# Patient Record
Sex: Male | Born: 1960 | Race: Black or African American | Hispanic: No | Marital: Married | Smoking: Current every day smoker
Health system: Southern US, Community
[De-identification: ages and names within clinical notes are randomized; demographics above are authoritative.]

## PROBLEM LIST (undated history)

## (undated) DIAGNOSIS — R0789 Other chest pain: Secondary | ICD-10-CM

## (undated) DIAGNOSIS — Z95818 Presence of other cardiac implants and grafts: Secondary | ICD-10-CM

## (undated) DIAGNOSIS — I10 Essential (primary) hypertension: Secondary | ICD-10-CM

## (undated) DIAGNOSIS — Z8241 Family history of sudden cardiac death: Secondary | ICD-10-CM

## (undated) DIAGNOSIS — Z720 Tobacco use: Secondary | ICD-10-CM

## (undated) DIAGNOSIS — I428 Other cardiomyopathies: Secondary | ICD-10-CM

## (undated) DIAGNOSIS — I5022 Chronic systolic (congestive) heart failure: Secondary | ICD-10-CM

## (undated) DIAGNOSIS — I5020 Unspecified systolic (congestive) heart failure: Secondary | ICD-10-CM

## (undated) DIAGNOSIS — R9431 Abnormal electrocardiogram [ECG] [EKG]: Secondary | ICD-10-CM

## (undated) DIAGNOSIS — H409 Unspecified glaucoma: Secondary | ICD-10-CM

## (undated) DIAGNOSIS — M1990 Unspecified osteoarthritis, unspecified site: Secondary | ICD-10-CM

## (undated) DIAGNOSIS — I456 Pre-excitation syndrome: Secondary | ICD-10-CM

## (undated) HISTORY — DX: Other cardiomyopathies: I42.8

## (undated) HISTORY — DX: Abnormal electrocardiogram (ECG) (EKG): R94.31

## (undated) HISTORY — PX: EYE SURGERY: SHX253

## (undated) HISTORY — DX: Unspecified osteoarthritis, unspecified site: M19.90

## (undated) HISTORY — DX: Unspecified glaucoma: H40.9

## (undated) HISTORY — DX: Family history of sudden cardiac death: Z82.41

## (undated) HISTORY — DX: Pre-excitation syndrome: I45.6

## (undated) HISTORY — DX: Unspecified systolic (congestive) heart failure: I50.20

## (undated) HISTORY — DX: Other chest pain: R07.89

## (undated) HISTORY — DX: Essential (primary) hypertension: I10

## (undated) HISTORY — DX: Presence of other cardiac implants and grafts: Z95.818

## (undated) HISTORY — DX: Chronic systolic (congestive) heart failure: I50.22

## (undated) HISTORY — DX: Tobacco use: Z72.0

---

## 2012-12-30 DIAGNOSIS — B182 Chronic viral hepatitis C: Secondary | ICD-10-CM | POA: Insufficient documentation

## 2013-11-12 DIAGNOSIS — M542 Cervicalgia: Secondary | ICD-10-CM | POA: Insufficient documentation

## 2014-01-14 DIAGNOSIS — N401 Enlarged prostate with lower urinary tract symptoms: Secondary | ICD-10-CM | POA: Insufficient documentation

## 2014-01-14 DIAGNOSIS — R3914 Feeling of incomplete bladder emptying: Secondary | ICD-10-CM | POA: Insufficient documentation

## 2014-01-31 DIAGNOSIS — G894 Chronic pain syndrome: Secondary | ICD-10-CM | POA: Insufficient documentation

## 2014-02-25 DIAGNOSIS — M79604 Pain in right leg: Secondary | ICD-10-CM | POA: Insufficient documentation

## 2014-03-16 DIAGNOSIS — R079 Chest pain, unspecified: Secondary | ICD-10-CM | POA: Insufficient documentation

## 2014-03-16 DIAGNOSIS — R002 Palpitations: Secondary | ICD-10-CM | POA: Insufficient documentation

## 2014-10-19 DIAGNOSIS — A048 Other specified bacterial intestinal infections: Secondary | ICD-10-CM | POA: Insufficient documentation

## 2016-01-15 DIAGNOSIS — H2513 Age-related nuclear cataract, bilateral: Secondary | ICD-10-CM | POA: Insufficient documentation

## 2016-06-07 DIAGNOSIS — F4321 Adjustment disorder with depressed mood: Secondary | ICD-10-CM

## 2016-06-07 HISTORY — DX: Adjustment disorder with depressed mood: F43.21

## 2016-08-12 DIAGNOSIS — R42 Dizziness and giddiness: Secondary | ICD-10-CM | POA: Insufficient documentation

## 2017-11-20 DIAGNOSIS — R0600 Dyspnea, unspecified: Secondary | ICD-10-CM | POA: Insufficient documentation

## 2017-11-20 DIAGNOSIS — R0609 Other forms of dyspnea: Secondary | ICD-10-CM | POA: Insufficient documentation

## 2018-01-06 DIAGNOSIS — F331 Major depressive disorder, recurrent, moderate: Secondary | ICD-10-CM | POA: Insufficient documentation

## 2019-05-04 DIAGNOSIS — G459 Transient cerebral ischemic attack, unspecified: Secondary | ICD-10-CM | POA: Insufficient documentation

## 2019-05-04 DIAGNOSIS — I10 Essential (primary) hypertension: Secondary | ICD-10-CM | POA: Insufficient documentation

## 2019-05-04 DIAGNOSIS — Q2112 Patent foramen ovale: Secondary | ICD-10-CM | POA: Insufficient documentation

## 2019-05-04 DIAGNOSIS — I456 Pre-excitation syndrome: Secondary | ICD-10-CM | POA: Insufficient documentation

## 2019-05-04 DIAGNOSIS — Q211 Atrial septal defect: Secondary | ICD-10-CM | POA: Insufficient documentation

## 2019-10-11 DIAGNOSIS — I420 Dilated cardiomyopathy: Secondary | ICD-10-CM | POA: Diagnosis not present

## 2019-10-11 DIAGNOSIS — F329 Major depressive disorder, single episode, unspecified: Secondary | ICD-10-CM | POA: Diagnosis not present

## 2019-10-11 DIAGNOSIS — I10 Essential (primary) hypertension: Secondary | ICD-10-CM | POA: Diagnosis not present

## 2019-10-11 DIAGNOSIS — L609 Nail disorder, unspecified: Secondary | ICD-10-CM | POA: Diagnosis not present

## 2019-10-11 DIAGNOSIS — Z681 Body mass index (BMI) 19 or less, adult: Secondary | ICD-10-CM | POA: Diagnosis not present

## 2019-10-28 DIAGNOSIS — Z4509 Encounter for adjustment and management of other cardiac device: Secondary | ICD-10-CM | POA: Diagnosis not present

## 2019-10-28 DIAGNOSIS — R002 Palpitations: Secondary | ICD-10-CM | POA: Diagnosis not present

## 2019-11-30 DIAGNOSIS — Z4509 Encounter for adjustment and management of other cardiac device: Secondary | ICD-10-CM | POA: Diagnosis not present

## 2019-12-31 DIAGNOSIS — Z4509 Encounter for adjustment and management of other cardiac device: Secondary | ICD-10-CM | POA: Diagnosis not present

## 2020-01-31 DIAGNOSIS — Z4509 Encounter for adjustment and management of other cardiac device: Secondary | ICD-10-CM | POA: Diagnosis not present

## 2020-01-31 DIAGNOSIS — R002 Palpitations: Secondary | ICD-10-CM | POA: Diagnosis not present

## 2020-02-21 DIAGNOSIS — H401123 Primary open-angle glaucoma, left eye, severe stage: Secondary | ICD-10-CM | POA: Diagnosis not present

## 2020-02-21 DIAGNOSIS — H26492 Other secondary cataract, left eye: Secondary | ICD-10-CM | POA: Diagnosis not present

## 2020-02-21 DIAGNOSIS — H401133 Primary open-angle glaucoma, bilateral, severe stage: Secondary | ICD-10-CM | POA: Insufficient documentation

## 2020-02-21 DIAGNOSIS — Z961 Presence of intraocular lens: Secondary | ICD-10-CM | POA: Diagnosis not present

## 2020-02-21 DIAGNOSIS — I10 Essential (primary) hypertension: Secondary | ICD-10-CM | POA: Diagnosis not present

## 2020-02-21 DIAGNOSIS — H2511 Age-related nuclear cataract, right eye: Secondary | ICD-10-CM | POA: Diagnosis not present

## 2020-02-21 DIAGNOSIS — H40001 Preglaucoma, unspecified, right eye: Secondary | ICD-10-CM | POA: Diagnosis not present

## 2020-02-21 DIAGNOSIS — H04123 Dry eye syndrome of bilateral lacrimal glands: Secondary | ICD-10-CM | POA: Diagnosis not present

## 2020-02-21 DIAGNOSIS — Z9889 Other specified postprocedural states: Secondary | ICD-10-CM | POA: Insufficient documentation

## 2020-03-02 DIAGNOSIS — Z4509 Encounter for adjustment and management of other cardiac device: Secondary | ICD-10-CM | POA: Diagnosis not present

## 2020-04-03 DIAGNOSIS — R002 Palpitations: Secondary | ICD-10-CM | POA: Diagnosis not present

## 2020-04-03 DIAGNOSIS — Z4509 Encounter for adjustment and management of other cardiac device: Secondary | ICD-10-CM | POA: Diagnosis not present

## 2020-05-22 DIAGNOSIS — Z122 Encounter for screening for malignant neoplasm of respiratory organs: Secondary | ICD-10-CM | POA: Diagnosis not present

## 2020-05-22 DIAGNOSIS — Z87891 Personal history of nicotine dependence: Secondary | ICD-10-CM | POA: Diagnosis not present

## 2020-06-05 DIAGNOSIS — Z4509 Encounter for adjustment and management of other cardiac device: Secondary | ICD-10-CM | POA: Diagnosis not present

## 2020-06-14 DIAGNOSIS — Z8673 Personal history of transient ischemic attack (TIA), and cerebral infarction without residual deficits: Secondary | ICD-10-CM | POA: Diagnosis not present

## 2020-06-14 DIAGNOSIS — Z681 Body mass index (BMI) 19 or less, adult: Secondary | ICD-10-CM | POA: Diagnosis not present

## 2020-06-14 DIAGNOSIS — B182 Chronic viral hepatitis C: Secondary | ICD-10-CM | POA: Diagnosis not present

## 2020-06-14 DIAGNOSIS — I420 Dilated cardiomyopathy: Secondary | ICD-10-CM | POA: Diagnosis not present

## 2020-06-14 DIAGNOSIS — I493 Ventricular premature depolarization: Secondary | ICD-10-CM | POA: Diagnosis not present

## 2020-06-14 DIAGNOSIS — R002 Palpitations: Secondary | ICD-10-CM | POA: Diagnosis not present

## 2020-06-14 DIAGNOSIS — I10 Essential (primary) hypertension: Secondary | ICD-10-CM | POA: Diagnosis not present

## 2020-06-14 DIAGNOSIS — F17210 Nicotine dependence, cigarettes, uncomplicated: Secondary | ICD-10-CM | POA: Diagnosis not present

## 2020-07-06 DIAGNOSIS — Z4509 Encounter for adjustment and management of other cardiac device: Secondary | ICD-10-CM | POA: Diagnosis not present

## 2020-07-06 DIAGNOSIS — R002 Palpitations: Secondary | ICD-10-CM | POA: Diagnosis not present

## 2020-11-29 ENCOUNTER — Encounter: Payer: Self-pay | Admitting: Cardiology

## 2020-11-29 ENCOUNTER — Other Ambulatory Visit: Payer: Self-pay

## 2020-11-29 ENCOUNTER — Ambulatory Visit (INDEPENDENT_AMBULATORY_CARE_PROVIDER_SITE_OTHER): Payer: Medicare HMO | Admitting: Cardiology

## 2020-11-29 VITALS — BP 114/70 | HR 60 | Ht 67.0 in | Wt 124.0 lb

## 2020-11-29 DIAGNOSIS — I428 Other cardiomyopathies: Secondary | ICD-10-CM | POA: Diagnosis not present

## 2020-11-29 DIAGNOSIS — R002 Palpitations: Secondary | ICD-10-CM

## 2020-11-29 NOTE — Progress Notes (Signed)
Electrophysiology Office Note:    Date:  11/29/2020   ID:  Jesus Price, DOB 1961-01-28, MRN [id number]  PCP:  Abram Sander, MD  [facility] Cardiologist:  None  [facility] Electrophysiologist:  None   Referring MD: Abram Sander, MD   Chief Complaint: Establish care  History of Present Illness:    Jesus Price is a 60 y.o. male who presents for an evaluation of nonischemic cardiomyopathy at the request of Dr. Richarda Blade. Their medical history includes nonischemic cardiomyopathy, hypertension, palpitations and a family history of sudden cardiac death.  In the past he was noted to have a short PR interval and underwent an EP study in 2019 which showed no evidence of accessory pathways or inducible SVT.  A loop recorder was implanted at that time.  His EF in 2012 was 40% but improved to 55% in 2017.  It looks like he has been followed by [facility] and [facility].  Today he tells me he is doing well.  He continues to feel intermittent palpitations.  He actually had some palpitations while we were interrogating his loop recorder.  At that time we noted PACs.  He told me that he has an extensive family history including 2 siblings who died early from heart disease.  A brother had open heart surgery in his 30s and a sister had bypass surgery in her 30s.  There is another younger sister who died early from complications from lupus.   Current Medications: Current Meds  Medication Sig  . CARDIZEM CD 180 MG 24 hr capsule Take 1 capsule by mouth daily.  . carvedilol (COREG) 6.25 MG tablet Take 6.25 mg by mouth 2 (two) times daily.  . hydrochlorothiazide (HYDRODIURIL) 12.5 MG tablet Take 1 tablet by mouth daily.  Marland Kitchen losartan (COZAAR) 100 MG tablet Take 1 tablet by mouth daily.  . mirtazapine (REMERON) 7.5 MG tablet Take 7.5 mg by mouth at bedtime.  . tamsulosin (FLOMAX) 0.4 MG CAPS capsule Take 1 capsule by mouth daily.     Allergies:   Patient has no known allergies.   Social History    Socioeconomic History  . Marital status: Married    Spouse name: Not on file  . Number of children: Not on file  . Years of education: Not on file  . Highest education level: Not on file  Occupational History  . Not on file  Tobacco Use  . Smoking status: Current Every Day Smoker    Types: Cigarettes  . Smokeless tobacco: Never Used  . Tobacco comment: 5 cigarettes per day   Substance and Sexual Activity  . Alcohol use: Not on file  . Drug use: Not on file  . Sexual activity: Not on file  Other Topics Concern  . Not on file  Social History Narrative  . Not on file   Social Determinants of Health   Financial Resource Strain: Not on file  Food Insecurity: Not on file  Transportation Needs: Not on file  Physical Activity: Not on file  Stress: Not on file  Social Connections: Not on file     Family History: The patient's family history is not on file.  ROS:   Please see the history of present illness.    All other systems reviewed and are negative.  EKGs/Labs/Other Studies Reviewed:    The following studies were reviewed today: Outside records  12/15/2017 Echo EF >55%  November 29, 2020 in clinic Loop interrogation personally reviewed No atrial fibrillation 7 symptom triggers for what  appears to be sinus rhythm with bigeminal PACs.  There is also some symptom triggered episodes for PVCs.  EKG:  The ekg ordered today demonstrates sinus rhythm.  LVH.  Recent Labs: No results found for requested labs within last 8760 hours.  Recent Lipid Panel No results found for: CHOL, TRIG, HDL, CHOLHDL, VLDL, LDLCALC, LDLDIRECT  Physical Exam:    VS:  Ht 5\' 7"  (1.702 m)   Wt 124 lb (56.2 kg)   BMI 19.42 kg/m     Wt Readings from Last 3 Encounters:  11/29/20 124 lb (56.2 kg)     GEN:  Well nourished, well developed in no acute distress.  Thin HEENT: Normal NECK: No JVD; No carotid bruits LYMPHATICS: No lymphadenopathy CARDIAC: RRR, no murmurs, rubs,  gallops RESPIRATORY:  Clear to auscultation without rales, wheezing or rhonchi  ABDOMEN: Soft, non-tender, non-distended MUSCULOSKELETAL:  No edema; No deformity  SKIN: Warm and dry NEUROLOGIC:  Alert and oriented x 3 PSYCHIATRIC:  Normal affect   ASSESSMENT:    1. NICM (nonischemic cardiomyopathy) (HCC)   2. Palpitations    PLAN:    In order of problems listed above:  1. Nonischemic cardiomyopathy NYHA class II.  Warm and dry.  Last EF in 2019 greater than 55%. Extensive family history of premature cardiovascular disease and death. I would like to update his echocardiogram given it was last done in 2019 to confirm no signs of significant structural heart disease or change in ejection fraction. We will also update his lab work including a lipid panel.  2.  Palpitations Loop monitor in place.  Controlled on carvedilol and Cardizem.  We will establish his loop recorder for monitoring with our clinic.  Medication Adjustments/Labs and Tests Ordered: Current medicines are reviewed at length with the patient today.  Concerns regarding medicines are outlined above.  No orders of the defined types were placed in this encounter.  No orders of the defined types were placed in this encounter.    Signed, 2020. Rossie Muskrat, MD, [facility], [facility] 11/29/2020 9:23 AM    Electrophysiology [facility]

## 2020-11-29 NOTE — Patient Instructions (Signed)
Medication Instructions:  Your physician recommends that you continue on your current medications as directed. Please refer to the Current Medication list given to you today. *If you need a refill on your cardiac medications before your next appointment, please call your pharmacy*  Lab Work: You will get lab work today:  CMP, CBC and lipid profile  If you have labs (blood work) drawn today and your tests are completely normal, you will receive your results only by: Marland Kitchen MyChart Message (if you have MyChart) OR . A paper copy in the mail If you have any lab test that is abnormal or we need to change your treatment, we will call you to review the results.  Testing/Procedures: Your physician has requested that you have an echocardiogram. Echocardiography is a painless test that uses sound waves to create images of your heart. It provides your doctor with information about the size and shape of your heart and how well your heart's chambers and valves are working. This procedure takes approximately one hour. There are no restrictions for this procedure.  Please schedule for ECHO  Follow-Up: At [facility], you and your health needs are our priority.  As part of our continuing mission to provide you with exceptional heart care, we have created designated Provider Care Teams.  These Care Teams include your primary Cardiologist (physician) and Advanced Practice Providers (APPs -  Physician Assistants and Nurse Practitioners) who all work together to provide you with the care you need, when you need it.  Your next appointment:   Your physician wants you to follow-up in: 6 months with Dr. Lalla Brothers.   You will receive a reminder letter in the mail two months in advance. If you don't receive a letter, please call our office to schedule the follow-up appointment.  Remote monitoring is used to monitor your loop recorder from home.   We will get your remote monitoring transferred to our office.  Device  clinic [phone]

## 2020-11-30 LAB — COMPREHENSIVE METABOLIC PANEL
ALT: 7 IU/L (ref 0–44)
AST: 12 IU/L (ref 0–40)
Albumin/Globulin Ratio: 1.8 (ref 1.2–2.2)
Albumin: 4.6 g/dL (ref 3.8–4.8)
Alkaline Phosphatase: 76 IU/L (ref 44–121)
BUN/Creatinine Ratio: 17 (ref 10–24)
BUN: 18 mg/dL (ref 8–27)
Bilirubin Total: 0.3 mg/dL (ref 0.0–1.2)
CO2: 23 mmol/L (ref 20–29)
Calcium: 9.1 mg/dL (ref 8.6–10.2)
Chloride: 103 mmol/L (ref 96–106)
Creatinine, Ser: 1.05 mg/dL (ref 0.76–1.27)
Globulin, Total: 2.6 g/dL (ref 1.5–4.5)
Glucose: 55 mg/dL — ABNORMAL LOW (ref 65–99)
Potassium: 4.6 mmol/L (ref 3.5–5.2)
Sodium: 140 mmol/L (ref 134–144)
Total Protein: 7.2 g/dL (ref 6.0–8.5)
eGFR: 80 mL/min/{1.73_m2} (ref >59)

## 2020-11-30 LAB — LIPID PANEL
Chol/HDL Ratio: 2.9 ratio (ref 0.0–5.0)
Cholesterol, Total: 150 mg/dL (ref 100–199)
HDL: 52 mg/dL (ref >39)
LDL Chol Calc (NIH): 82 mg/dL (ref 0–99)
Triglycerides: 83 mg/dL (ref 0–149)
VLDL Cholesterol Cal: 16 mg/dL (ref 5–40)

## 2020-11-30 LAB — CBC WITH DIFFERENTIAL/PLATELET
Basophils Absolute: 0 10*3/uL (ref 0.0–0.2)
Basos: 1 %
EOS (ABSOLUTE): 0.1 10*3/uL (ref 0.0–0.4)
Eos: 2 %
Hematocrit: 40.2 % (ref 37.5–51.0)
Hemoglobin: 13.9 g/dL (ref 13.0–17.7)
Immature Grans (Abs): 0 10*3/uL (ref 0.0–0.1)
Immature Granulocytes: 0 %
Lymphocytes Absolute: 2.1 10*3/uL (ref 0.7–3.1)
Lymphs: 38 %
MCH: 32.5 pg (ref 26.6–33.0)
MCHC: 34.6 g/dL (ref 31.5–35.7)
MCV: 94 fL (ref 79–97)
Monocytes Absolute: 0.3 10*3/uL (ref 0.1–0.9)
Monocytes: 6 %
Neutrophils Absolute: 2.9 10*3/uL (ref 1.4–7.0)
Neutrophils: 53 %
Platelets: 202 10*3/uL (ref 150–450)
RBC: 4.28 x10E6/uL (ref 4.14–5.80)
RDW: 12.8 % (ref 11.6–15.4)
WBC: 5.5 10*3/uL (ref 3.4–10.8)

## 2020-12-04 ENCOUNTER — Encounter: Payer: Self-pay | Admitting: Podiatry

## 2020-12-04 ENCOUNTER — Ambulatory Visit: Payer: Medicare HMO | Admitting: Podiatry

## 2020-12-04 ENCOUNTER — Other Ambulatory Visit: Payer: Self-pay

## 2020-12-04 DIAGNOSIS — L603 Nail dystrophy: Secondary | ICD-10-CM

## 2020-12-04 DIAGNOSIS — B351 Tinea unguium: Secondary | ICD-10-CM | POA: Diagnosis not present

## 2020-12-04 DIAGNOSIS — M79676 Pain in unspecified toe(s): Secondary | ICD-10-CM

## 2020-12-04 DIAGNOSIS — I429 Cardiomyopathy, unspecified: Secondary | ICD-10-CM | POA: Insufficient documentation

## 2020-12-04 DIAGNOSIS — I428 Other cardiomyopathies: Secondary | ICD-10-CM | POA: Insufficient documentation

## 2020-12-04 NOTE — Progress Notes (Signed)
  Subjective:  Patient ID: Jesus Price, male    DOB: Mar 04, 1961,  MRN: [id number] HPI Chief Complaint  Patient presents with  . Nail Problem    Hallux nail right - thick, dark x years, unable to trim himself  . New Patient (Initial Visit)    60 y.o. male presents with the above complaint.   ROS: Denies fever chills nausea vomit muscle aches pains calf pain back pain chest pain shortness of breath.  No past medical history on file.   Current Outpatient Medications:  .  CARDIZEM CD 180 MG 24 hr capsule, Take 1 capsule by mouth daily., Disp: , Rfl:  .  carvedilol (COREG) 6.25 MG tablet, Take 6.25 mg by mouth 2 (two) times daily., Disp: , Rfl:  .  hydrochlorothiazide (HYDRODIURIL) 12.5 MG tablet, Take 1 tablet by mouth daily., Disp: , Rfl:  .  losartan (COZAAR) 100 MG tablet, Take 1 tablet by mouth daily., Disp: , Rfl:  .  mirtazapine (REMERON) 7.5 MG tablet, Take 7.5 mg by mouth at bedtime., Disp: , Rfl:  .  tamsulosin (FLOMAX) 0.4 MG CAPS capsule, Take 1 capsule by mouth daily., Disp: , Rfl:   No Known Allergies Review of Systems Objective:  There were no vitals filed for this visit.  General: Well developed, nourished, in no acute distress, alert and oriented x3   Dermatological: Skin is warm, dry and supple bilateral. Nails x 10 are well maintained; remaining integument appears unremarkable at this time. There are no open sores, no preulcerative lesions, no rash or signs of infection present.  Hallux nail right is thick and dystrophic very well could be mycotic though the surrounding tissue does not demonstrate mycosis.  Vascular: Dorsalis Pedis artery and Posterior Tibial artery pedal pulses are 2/4 bilateral with immedate capillary fill time. Pedal hair growth present. No varicosities and no lower extremity edema present bilateral.   Neruologic: Grossly intact via light touch bilateral. Vibratory intact via tuning fork bilateral. Protective threshold with Semmes Wienstein  monofilament intact to all pedal sites bilateral. Patellar and Achilles deep tendon reflexes 2+ bilateral. No Babinski or clonus noted bilateral.   Musculoskeletal: No gross boney pedal deformities bilateral. No pain, crepitus, or limitation noted with foot and ankle range of motion bilateral. Muscular strength 5/5 in all groups tested bilateral.  Gait: Unassisted, Nonantalgic.    Radiographs:  None taken  Assessment & Plan:   Assessment: Pain limb secondary to nail dystrophy and onychomycosis.  Plan: Debridement of toenails 1 through 5 bilateral     Erilyn Pearman T. [place], [place]

## 2020-12-05 ENCOUNTER — Ambulatory Visit (INDEPENDENT_AMBULATORY_CARE_PROVIDER_SITE_OTHER): Payer: Medicare HMO

## 2020-12-05 DIAGNOSIS — I429 Cardiomyopathy, unspecified: Secondary | ICD-10-CM

## 2020-12-05 LAB — CUP PACEART REMOTE DEVICE CHECK
Date Time Interrogation Session: 20220607034950
Implantable Pulse Generator Implant Date: 20190825

## 2020-12-15 DIAGNOSIS — L980 Pyogenic granuloma: Secondary | ICD-10-CM | POA: Insufficient documentation

## 2020-12-27 NOTE — Progress Notes (Signed)
Carelink Summary Report / Loop Recorder 

## 2021-01-05 ENCOUNTER — Ambulatory Visit (INDEPENDENT_AMBULATORY_CARE_PROVIDER_SITE_OTHER): Payer: Medicare HMO

## 2021-01-05 DIAGNOSIS — R002 Palpitations: Secondary | ICD-10-CM

## 2021-01-05 LAB — CUP PACEART REMOTE DEVICE CHECK
Date Time Interrogation Session: 20220708035320
Implantable Pulse Generator Implant Date: 20190825

## 2021-01-11 ENCOUNTER — Other Ambulatory Visit: Payer: Self-pay

## 2021-01-11 ENCOUNTER — Ambulatory Visit (INDEPENDENT_AMBULATORY_CARE_PROVIDER_SITE_OTHER): Payer: Medicare HMO

## 2021-01-11 DIAGNOSIS — I428 Other cardiomyopathies: Secondary | ICD-10-CM | POA: Diagnosis not present

## 2021-01-11 LAB — ECHOCARDIOGRAM COMPLETE
AR max vel: 3.03 cm2
AV Area VTI: 3.11 cm2
AV Area mean vel: 2.94 cm2
AV Mean grad: 3 mmHg
AV Peak grad: 4.8 mmHg
Ao pk vel: 1.09 m/s
Area-P 1/2: 3.72 cm2
Calc EF: 40.7 %
S' Lateral: 3.5 cm
Single Plane A2C EF: 37.4 %
Single Plane A4C EF: 48.1 %

## 2021-01-18 ENCOUNTER — Ambulatory Visit: Payer: Medicare HMO | Admitting: Adult Health

## 2021-01-26 NOTE — Progress Notes (Signed)
Carelink Summary Report / Loop Recorder 

## 2021-02-07 ENCOUNTER — Ambulatory Visit (INDEPENDENT_AMBULATORY_CARE_PROVIDER_SITE_OTHER): Payer: Medicare HMO

## 2021-02-07 DIAGNOSIS — I428 Other cardiomyopathies: Secondary | ICD-10-CM

## 2021-02-08 LAB — CUP PACEART REMOTE DEVICE CHECK
Date Time Interrogation Session: 20220808035613
Implantable Pulse Generator Implant Date: 20190825

## 2021-02-15 ENCOUNTER — Encounter: Payer: Self-pay | Admitting: Adult Health

## 2021-03-01 NOTE — Progress Notes (Signed)
Carelink Summary Report / Loop Recorder 

## 2021-03-08 ENCOUNTER — Encounter: Payer: Self-pay | Admitting: Podiatry

## 2021-03-08 ENCOUNTER — Other Ambulatory Visit: Payer: Self-pay

## 2021-03-08 ENCOUNTER — Ambulatory Visit: Payer: Medicare HMO | Admitting: Podiatry

## 2021-03-08 DIAGNOSIS — M79676 Pain in unspecified toe(s): Secondary | ICD-10-CM

## 2021-03-08 DIAGNOSIS — B351 Tinea unguium: Secondary | ICD-10-CM

## 2021-03-08 DIAGNOSIS — L603 Nail dystrophy: Secondary | ICD-10-CM

## 2021-03-08 LAB — CUP PACEART REMOTE DEVICE CHECK
Date Time Interrogation Session: 20220908040110
Implantable Pulse Generator Implant Date: 20190825

## 2021-03-08 NOTE — Progress Notes (Signed)
This patient returns to the office for evaluation and treatment of long thick painful nails .  This patient is unable to trim his own nails since the patient cannot reach his feet.  Patient says the nails are painful walking and wearing his shoes.  He returns for preventive foot care services.  General Appearance  Alert, conversant and in no acute stress.  Vascular  Dorsalis pedis and posterior tibial  pulses are palpable  bilaterally.  Capillary return is within normal limits  bilaterally. Temperature is within normal limits  bilaterally.  Neurologic  Senn-Weinstein monofilament wire test within normal limits  bilaterally. Muscle power within normal limits bilaterally.  Nails Thick disfigured discolored nails with subungual debris  from hallux to fifth toes bilaterally. No evidence of bacterial infection or drainage bilaterally.  Orthopedic  No limitations of motion  feet .  No crepitus or effusions noted.  No bony pathology or digital deformities noted.  Skin  normotropic skin with no porokeratosis noted bilaterally.  No signs of infections or ulcers noted.     Onychomycosis  Pain in toes right foot  Pain in toes left foot  Debridement  of nails  1-5  B/L with a nail nipper.  Nails were then filed using a dremel tool with no incidents.    RTC  3 months    Tedra Coppernoll DPM  

## 2021-03-12 ENCOUNTER — Ambulatory Visit (INDEPENDENT_AMBULATORY_CARE_PROVIDER_SITE_OTHER): Payer: Medicare HMO

## 2021-03-12 ENCOUNTER — Ambulatory Visit: Payer: Medicare HMO | Admitting: Adult Health

## 2021-03-12 DIAGNOSIS — I428 Other cardiomyopathies: Secondary | ICD-10-CM | POA: Diagnosis not present

## 2021-03-20 NOTE — Progress Notes (Signed)
Carelink Summary Report / Loop Recorder 

## 2021-04-16 ENCOUNTER — Ambulatory Visit (INDEPENDENT_AMBULATORY_CARE_PROVIDER_SITE_OTHER): Payer: Medicare HMO

## 2021-04-16 DIAGNOSIS — I428 Other cardiomyopathies: Secondary | ICD-10-CM

## 2021-04-18 LAB — CUP PACEART REMOTE DEVICE CHECK
Date Time Interrogation Session: 20221009040311
Implantable Pulse Generator Implant Date: 20190825

## 2021-04-25 NOTE — Progress Notes (Signed)
Carelink Summary Report / Loop Recorder 

## 2021-04-30 ENCOUNTER — Ambulatory Visit: Payer: Medicare HMO | Admitting: Adult Health

## 2021-05-15 ENCOUNTER — Other Ambulatory Visit: Payer: Self-pay

## 2021-05-15 ENCOUNTER — Encounter: Payer: Self-pay | Admitting: Internal Medicine

## 2021-05-15 ENCOUNTER — Ambulatory Visit (INDEPENDENT_AMBULATORY_CARE_PROVIDER_SITE_OTHER): Payer: Medicare HMO | Admitting: Internal Medicine

## 2021-05-15 VITALS — BP 126/80 | HR 65 | Temp 98.6°F | Ht 67.05 in | Wt 124.6 lb

## 2021-05-15 DIAGNOSIS — Z23 Encounter for immunization: Secondary | ICD-10-CM | POA: Diagnosis not present

## 2021-05-15 DIAGNOSIS — N529 Male erectile dysfunction, unspecified: Secondary | ICD-10-CM | POA: Diagnosis not present

## 2021-05-15 DIAGNOSIS — I456 Pre-excitation syndrome: Secondary | ICD-10-CM

## 2021-05-15 DIAGNOSIS — M79672 Pain in left foot: Secondary | ICD-10-CM | POA: Diagnosis not present

## 2021-05-15 NOTE — Progress Notes (Signed)
BP 126/80   Pulse 65   Temp 98.6 F (37 C) (Oral)   Ht 5' 7.05" (1.703 m)   Wt 124 lb 9.6 oz (56.5 kg)   SpO2 100%   BMI 19.49 kg/m    Subjective:    Patient ID: Jesus Price, male    DOB: 1961/04/26, 60 y.o.   MRN: [id number]  Chief Complaint  Patient presents with   New Patient (Initial Visit)    Patient states he has had groin pain for the past 3 to 4 years, and that he feels burning in both his feet for the past year.     HPI: Jesus Price is a 60 y.o. male  nonischemic cardiomyopathy and palpitations - was diagnosed wolf parkinson white WPW syndrome x 60 yrs old   has a Loop monitor in place x 3 yrs, sees dr. Lalla Brothers @ [facility], pt is on  carvedilol and Cardizem. Has a ho  HtN , GLAUCOMA s/p shunts seeing ophthalmology and cataracts,   Has some depression sec to ED.   Has had some falls recently and has   Hypertension This is a chronic problem. Pertinent negatives include no anxiety, blurred vision, chest pain, headaches, malaise/fatigue, neck pain, orthopnea, palpitations, peripheral edema, PND, shortness of breath or sweats.  Erectile Dysfunction This is a chronic problem. The current episode started more than 1 year ago. Pertinent negatives include no chills.  Dizziness This is a recurrent (fell x 2 weeks ago , has had about 4 times per his verbal record.) problem. The problem occurs intermittently. Pertinent negatives include no abdominal pain, anorexia, arthralgias, change in bowel habit, chest pain, chills, congestion, coughing, diaphoresis, fatigue, fever, headaches, joint swelling, myalgias, nausea, neck pain, numbness, rash, sore throat, swollen glands, urinary symptoms, vertigo, vomiting or weakness.   Chief Complaint  Patient presents with   New Patient (Initial Visit)    Patient states he has had groin pain for the past 3 to 4 years, and that he feels burning in both his feet for the past year.     Relevant past medical, surgical,  family and social history reviewed and updated as indicated. Interim medical history since our last visit reviewed. Allergies and medications reviewed and updated.  Review of Systems  Constitutional:  Negative for chills, diaphoresis, fatigue, fever and malaise/fatigue.  HENT:  Negative for congestion and sore throat.   Eyes:  Negative for blurred vision.  Respiratory:  Negative for cough and shortness of breath.   Cardiovascular:  Negative for chest pain, palpitations, orthopnea and PND.  Gastrointestinal:  Negative for abdominal pain, anorexia, change in bowel habit, nausea and vomiting.  Musculoskeletal:  Negative for arthralgias, joint swelling, myalgias and neck pain.  Skin:  Negative for rash.  Neurological:  Positive for dizziness. Negative for vertigo, weakness, numbness and headaches.   Per HPI unless specifically indicated above     Objective:    BP 126/80   Pulse 65   Temp 98.6 F (37 C) (Oral)   Ht 5' 7.05" (1.703 m)   Wt 124 lb 9.6 oz (56.5 kg)   SpO2 100%   BMI 19.49 kg/m   Wt Readings from Last 3 Encounters:  05/15/21 124 lb 9.6 oz (56.5 kg)  11/29/20 124 lb (56.2 kg)    Physical Exam Vitals and nursing note reviewed.  Constitutional:      General: He is not in acute distress.    Appearance: Normal appearance. He is not ill-appearing or diaphoretic.  HENT:  Head: Normocephalic and atraumatic.     Right Ear: There is no impacted cerumen.     Nose: No congestion.  Eyes:     Conjunctiva/sclera: Conjunctivae normal.     Pupils: Pupils are equal, round, and reactive to light.  Cardiovascular:     Rate and Rhythm: Normal rate and regular rhythm.     Heart sounds: No murmur heard.   No friction rub. No gallop.  Pulmonary:     Effort: No respiratory distress.     Breath sounds: No stridor. No wheezing or rhonchi.  Chest:     Chest wall: No tenderness.  Abdominal:     General: Abdomen is flat. Bowel sounds are normal. There is no distension.      Palpations: Abdomen is soft. There is no mass.     Tenderness: There is no abdominal tenderness. There is no guarding.  Musculoskeletal:     Cervical back: Normal range of motion and neck supple. No rigidity or tenderness.     Left lower leg: No edema.  Skin:    General: Skin is warm and dry.  Neurological:     Mental Status: He is alert.     Cranial Nerves: No cranial nerve deficit.     Sensory: No sensory deficit.     Motor: No weakness.     Coordination: Coordination normal.     Gait: Gait normal.     Deep Tendon Reflexes: Reflexes normal.  Psychiatric:        Mood and Affect: Mood normal.        Thought Content: Thought content normal.    Results for orders placed or performed in visit on 04/16/21  CUP PACEART REMOTE DEVICE CHECK  Result Value Ref Range   Date Time Interrogation Session [id number]    Pulse Generator Manufacturer MERM    Pulse Gen Model QMG50 Reveal LINQ    Pulse Gen Serial Number [id number]    Clinic Name [facility]    Implantable Pulse Generator Type ICM/ILR    Implantable Pulse Generator Implant Date [id number]    Eval Rhythm SR at 65 bpm         Current Outpatient Medications:    CARDIZEM CD 180 MG 24 hr capsule, Take 1 capsule by mouth daily., Disp: , Rfl:    carvedilol (COREG) 6.25 MG tablet, Take 6.25 mg by mouth 2 (two) times daily., Disp: , Rfl:    diltiazem (TIAZAC) 180 MG 24 hr capsule, diltiazem CD 180 mg capsule,extended release 24 hr, Disp: , Rfl:    losartan (COZAAR) 100 MG tablet, Take 1 tablet by mouth daily., Disp: , Rfl:    mirtazapine (REMERON) 7.5 MG tablet, Take 7.5 mg by mouth at bedtime., Disp: , Rfl:    tamsulosin (FLOMAX) 0.4 MG CAPS capsule, Take 1 capsule by mouth daily., Disp: , Rfl:     Assessment & Plan:  Is/recurrent falls history of WPW syndrome   will need to follow-up with cardiology asap Pulse rate is 65 today unsure if this is too low for him not sure what his baseline pulse rate is. Will need an echo as  per cardiology notes has had this at [facility] last ejection fraction per chart review 55% in 2017 and no new echo since then. Patient had an EP study in 2019 which showed no evidence of accessory pathway or inducible SVTs patient. Patient has nonischemic cardiomyopathy history of palpitations as well follow-up and management per cardiology.  Hypertension: Chronic stable well-controlled at this time  patient is on losartan, Coreg, diltiazem for such Continue current meds.  Medication compliance emphasised. pt advised to keep Bp logs. Pt verbalised understanding of the same. Pt to have a low salt diet . Exercise to reach a goal of at least 150 mins a week.  lifestyle modifications explained and pt understands importance of the above. ,  Foot pain with nodule on the plantar surface of the sole of the left foot will need to follow-up with podiatry for such.  Patient would like a new referral to see a new podiatrist as he did discuss this with his last podiatrist who did not do anything about this per his verbal record. Will refer.  Erectile dysfunction would like a referral to see urology.  He did see somebody in the past who was at and would like a new urologist Problem List Items Addressed This Visit   None Visit Diagnoses     Need for influenza vaccination    -  Primary   Relevant Orders   Flu Vaccine QUAD 30mo+IM (Fluarix, Fluzone & Alfiuria Quad PF) (Completed)        Orders Placed This Encounter  Procedures   Flu Vaccine QUAD 12mo+IM (Fluarix, Fluzone & Alfiuria Quad PF)     No orders of the defined types were placed in this encounter.    Follow up plan: No follow-ups on file.

## 2021-05-21 ENCOUNTER — Ambulatory Visit (INDEPENDENT_AMBULATORY_CARE_PROVIDER_SITE_OTHER): Payer: Medicare HMO

## 2021-05-21 DIAGNOSIS — I428 Other cardiomyopathies: Secondary | ICD-10-CM | POA: Diagnosis not present

## 2021-05-22 LAB — CUP PACEART REMOTE DEVICE CHECK
Date Time Interrogation Session: 20221120232436
Implantable Pulse Generator Implant Date: 20190825

## 2021-05-29 NOTE — Progress Notes (Signed)
Carelink Summary Report / Loop Recorder 

## 2021-06-01 ENCOUNTER — Ambulatory Visit: Payer: Medicare HMO | Admitting: Podiatry

## 2021-06-07 ENCOUNTER — Ambulatory Visit: Payer: Medicare HMO | Admitting: Podiatry

## 2021-06-08 ENCOUNTER — Other Ambulatory Visit: Payer: Medicare HMO

## 2021-06-08 ENCOUNTER — Other Ambulatory Visit: Payer: Self-pay

## 2021-06-08 DIAGNOSIS — N529 Male erectile dysfunction, unspecified: Secondary | ICD-10-CM

## 2021-06-08 DIAGNOSIS — M79672 Pain in left foot: Secondary | ICD-10-CM

## 2021-06-08 DIAGNOSIS — I456 Pre-excitation syndrome: Secondary | ICD-10-CM

## 2021-06-08 LAB — URINALYSIS, ROUTINE W REFLEX MICROSCOPIC
Bilirubin, UA: NEGATIVE
Glucose, UA: NEGATIVE
Ketones, UA: NEGATIVE
Leukocytes,UA: NEGATIVE
Nitrite, UA: NEGATIVE
Protein,UA: NEGATIVE
RBC, UA: NEGATIVE
Specific Gravity, UA: 1.02 (ref 1.005–1.030)
Urobilinogen, Ur: 0.2 mg/dL (ref 0.2–1.0)
pH, UA: 6 (ref 5.0–7.5)

## 2021-06-09 LAB — COMPREHENSIVE METABOLIC PANEL
ALT: 8 IU/L (ref 0–44)
AST: 14 IU/L (ref 0–40)
Albumin/Globulin Ratio: 1.4 (ref 1.2–2.2)
Albumin: 4.5 g/dL (ref 3.8–4.8)
Alkaline Phosphatase: 75 IU/L (ref 44–121)
BUN/Creatinine Ratio: 17 (ref 10–24)
BUN: 19 mg/dL (ref 8–27)
Bilirubin Total: 0.4 mg/dL (ref 0.0–1.2)
CO2: 24 mmol/L (ref 20–29)
Calcium: 9.5 mg/dL (ref 8.6–10.2)
Chloride: 101 mmol/L (ref 96–106)
Creatinine, Ser: 1.09 mg/dL (ref 0.76–1.27)
Globulin, Total: 3.2 g/dL (ref 1.5–4.5)
Glucose: 81 mg/dL (ref 70–99)
Potassium: 4.7 mmol/L (ref 3.5–5.2)
Sodium: 139 mmol/L (ref 134–144)
Total Protein: 7.7 g/dL (ref 6.0–8.5)
eGFR: 77 mL/min/{1.73_m2} (ref >59)

## 2021-06-09 LAB — CBC WITH DIFFERENTIAL/PLATELET
Basophils Absolute: 0.1 10*3/uL (ref 0.0–0.2)
Basos: 1 %
EOS (ABSOLUTE): 0.1 10*3/uL (ref 0.0–0.4)
Eos: 2 %
Hematocrit: 39.1 % (ref 37.5–51.0)
Hemoglobin: 14 g/dL (ref 13.0–17.7)
Immature Grans (Abs): 0 10*3/uL (ref 0.0–0.1)
Immature Granulocytes: 0 %
Lymphocytes Absolute: 2.1 10*3/uL (ref 0.7–3.1)
Lymphs: 36 %
MCH: 32.6 pg (ref 26.6–33.0)
MCHC: 35.8 g/dL — ABNORMAL HIGH (ref 31.5–35.7)
MCV: 91 fL (ref 79–97)
Monocytes Absolute: 0.3 10*3/uL (ref 0.1–0.9)
Monocytes: 5 %
Neutrophils Absolute: 3.3 10*3/uL (ref 1.4–7.0)
Neutrophils: 56 %
Platelets: 213 10*3/uL (ref 150–450)
RBC: 4.3 x10E6/uL (ref 4.14–5.80)
RDW: 12.6 % (ref 11.6–15.4)
WBC: 5.8 10*3/uL (ref 3.4–10.8)

## 2021-06-09 LAB — LIPID PANEL
Chol/HDL Ratio: 2.7 ratio (ref 0.0–5.0)
Cholesterol, Total: 156 mg/dL (ref 100–199)
HDL: 57 mg/dL (ref >39)
LDL Chol Calc (NIH): 88 mg/dL (ref 0–99)
Triglycerides: 54 mg/dL (ref 0–149)
VLDL Cholesterol Cal: 11 mg/dL (ref 5–40)

## 2021-06-09 LAB — TSH: TSH: 0.938 u[IU]/mL (ref 0.450–4.500)

## 2021-06-09 LAB — PSA: Prostate Specific Ag, Serum: 0.2 ng/mL (ref 0.0–4.0)

## 2021-06-15 ENCOUNTER — Encounter: Payer: Self-pay | Admitting: Podiatry

## 2021-06-15 ENCOUNTER — Other Ambulatory Visit: Payer: Self-pay

## 2021-06-15 ENCOUNTER — Ambulatory Visit: Payer: Medicare HMO | Admitting: Podiatry

## 2021-06-15 ENCOUNTER — Ambulatory Visit (INDEPENDENT_AMBULATORY_CARE_PROVIDER_SITE_OTHER): Payer: Medicare HMO

## 2021-06-15 ENCOUNTER — Encounter: Payer: Self-pay | Admitting: Internal Medicine

## 2021-06-15 ENCOUNTER — Ambulatory Visit (INDEPENDENT_AMBULATORY_CARE_PROVIDER_SITE_OTHER): Payer: Medicare HMO | Admitting: Internal Medicine

## 2021-06-15 VITALS — BP 128/66 | HR 52 | Temp 97.9°F | Ht 66.93 in | Wt 125.2 lb

## 2021-06-15 DIAGNOSIS — M2142 Flat foot [pes planus] (acquired), left foot: Secondary | ICD-10-CM

## 2021-06-15 DIAGNOSIS — G459 Transient cerebral ischemic attack, unspecified: Secondary | ICD-10-CM

## 2021-06-15 DIAGNOSIS — M79674 Pain in right toe(s): Secondary | ICD-10-CM | POA: Diagnosis not present

## 2021-06-15 DIAGNOSIS — I456 Pre-excitation syndrome: Secondary | ICD-10-CM

## 2021-06-15 DIAGNOSIS — M779 Enthesopathy, unspecified: Secondary | ICD-10-CM | POA: Diagnosis not present

## 2021-06-15 DIAGNOSIS — M2141 Flat foot [pes planus] (acquired), right foot: Secondary | ICD-10-CM

## 2021-06-15 DIAGNOSIS — M722 Plantar fascial fibromatosis: Secondary | ICD-10-CM

## 2021-06-15 DIAGNOSIS — B351 Tinea unguium: Secondary | ICD-10-CM | POA: Diagnosis not present

## 2021-06-15 DIAGNOSIS — M79675 Pain in left toe(s): Secondary | ICD-10-CM | POA: Diagnosis not present

## 2021-06-15 DIAGNOSIS — R42 Dizziness and giddiness: Secondary | ICD-10-CM | POA: Insufficient documentation

## 2021-06-15 MED ORDER — MELOXICAM 15 MG PO TABS: 15.0000 mg | ORAL_TABLET | Freq: Every day | ORAL | 1 refills | Status: DC

## 2021-06-15 NOTE — Progress Notes (Signed)
BP 128/66    Pulse (!) 52    Temp 97.9 F (36.6 C) (Oral)    Ht 5' 6.93" (1.7 m)    Wt 125 lb 3.2 oz (56.8 kg)    SpO2 98%    BMI 19.65 kg/m    Subjective:    Patient ID: Jesus Price, male    DOB: [birth year], 60 y.o.   MRN: [id number]  Chief Complaint  Patient presents with   Foot Pain    Patient states that feet still hurt   Erectile Dysfunction   Wolff-Parkinson-White Syndrome    HPI: Jesus Price is a 60 y.o. male  Foot Pain  Heart Problem This is a chronic (ho wpw syndrome sees cards for such was seen before he established care. cameron lambert was EP physician he saw @ [facility] in [place]) problem. Episode onset: co dizziness.  Dizziness This is a chronic (stumbles when he feels dizzy to set up a fu with cards , didnt d/w wife about htis) problem.   Chief Complaint  Patient presents with   Foot Pain    Patient states that feet still hurt   Erectile Dysfunction   Wolff-Parkinson-White Syndrome    Relevant past medical, surgical, family and social history reviewed and updated as indicated. Interim medical history since our last visit reviewed. Allergies and medications reviewed and updated.  Review of Systems  Neurological:  Positive for dizziness.   Per HPI unless specifically indicated above     Objective:    BP 128/66    Pulse (!) 52    Temp 97.9 F (36.6 C) (Oral)    Ht 5' 6.93" (1.7 m)    Wt 125 lb 3.2 oz (56.8 kg)    SpO2 98%    BMI 19.65 kg/m   Wt Readings from Last 3 Encounters:  06/15/21 125 lb 3.2 oz (56.8 kg)  05/15/21 124 lb 9.6 oz (56.5 kg)  11/29/20 124 lb (56.2 kg)    Physical Exam Vitals and nursing note reviewed.  Constitutional:      General: He is not in acute distress.    Appearance: Normal appearance. He is not ill-appearing or diaphoretic.  HENT:     Head: Normocephalic and atraumatic.     Right Ear: Tympanic membrane and external ear normal. There is no impacted cerumen.     Left Ear: External ear normal.      Nose: No congestion or rhinorrhea.     Mouth/Throat:     Pharynx: No oropharyngeal exudate or posterior oropharyngeal erythema.  Eyes:     Conjunctiva/sclera: Conjunctivae normal.     Pupils: Pupils are equal, round, and reactive to light.  Cardiovascular:     Rate and Rhythm: Normal rate and regular rhythm.     Heart sounds: No murmur heard.   No friction rub. No gallop.  Pulmonary:     Effort: No respiratory distress.     Breath sounds: No stridor. No wheezing or rhonchi.  Chest:     Chest wall: No tenderness.  Abdominal:     General: Abdomen is flat. Bowel sounds are normal.     Palpations: Abdomen is soft. There is no mass.     Tenderness: There is no abdominal tenderness.  Musculoskeletal:     Cervical back: Normal range of motion and neck supple. No rigidity or tenderness.     Left lower leg: No edema.  Skin:    General: Skin is warm and dry.  Neurological:  Mental Status: He is alert.    Results for orders placed or performed in visit on 06/08/21  Lipid panel  Result Value Ref Range   Cholesterol, Total 156 100 - 199 mg/dL   Triglycerides 54 0 - 149 mg/dL   HDL 57 >39 mg/dL   VLDL Cholesterol Cal 11 5 - 40 mg/dL   LDL Chol Calc (NIH) 88 0 - 99 mg/dL   Chol/HDL Ratio 2.7 0.0 - 5.0 ratio  Urinalysis, Routine w reflex microscopic  Result Value Ref Range   Specific Gravity, UA 1.020 1.005 - 1.030   pH, UA 6.0 5.0 - 7.5   Color, UA Yellow Yellow   Appearance Ur Clear Clear   Leukocytes,UA Negative Negative   Protein,UA Negative Negative/Trace   Glucose, UA Negative Negative   Ketones, UA Negative Negative   RBC, UA Negative Negative   Bilirubin, UA Negative Negative   Urobilinogen, Ur 0.2 0.2 - 1.0 mg/dL   Nitrite, UA Negative Negative  TSH  Result Value Ref Range   TSH 0.938 0.450 - 4.500 uIU/mL  PSA  Result Value Ref Range   Prostate Specific Ag, Serum 0.2 0.0 - 4.0 ng/mL  Comprehensive metabolic panel  Result Value Ref Range   Glucose 81 70 - 99  mg/dL   BUN 19 8 - 27 mg/dL   Creatinine, Ser 1.09 0.76 - 1.27 mg/dL   eGFR 77 >59 mL/min/1.73   BUN/Creatinine Ratio 17 10 - 24   Sodium 139 134 - 144 mmol/L   Potassium 4.7 3.5 - 5.2 mmol/L   Chloride 101 96 - 106 mmol/L   CO2 24 20 - 29 mmol/L   Calcium 9.5 8.6 - 10.2 mg/dL   Total Protein 7.7 6.0 - 8.5 g/dL   Albumin 4.5 3.8 - 4.8 g/dL   Globulin, Total 3.2 1.5 - 4.5 g/dL   Albumin/Globulin Ratio 1.4 1.2 - 2.2   Bilirubin Total 0.4 0.0 - 1.2 mg/dL   Alkaline Phosphatase 75 44 - 121 IU/L   AST 14 0 - 40 IU/L   ALT 8 0 - 44 IU/L  CBC with Differential/Platelet  Result Value Ref Range   WBC 5.8 3.4 - 10.8 x10E3/uL   RBC 4.30 4.14 - 5.80 x10E6/uL   Hemoglobin 14.0 13.0 - 17.7 g/dL   Hematocrit 39.1 37.5 - 51.0 %   MCV 91 79 - 97 fL   MCH 32.6 26.6 - 33.0 pg   MCHC 35.8 (H) 31.5 - 35.7 g/dL   RDW 12.6 11.6 - 15.4 %   Platelets 213 150 - 450 x10E3/uL   Neutrophils 56 Not Estab. %   Lymphs 36 Not Estab. %   Monocytes 5 Not Estab. %   Eos 2 Not Estab. %   Basos 1 Not Estab. %   Neutrophils Absolute 3.3 1.4 - 7.0 x10E3/uL   Lymphocytes Absolute 2.1 0.7 - 3.1 x10E3/uL   Monocytes Absolute 0.3 0.1 - 0.9 x10E3/uL   EOS (ABSOLUTE) 0.1 0.0 - 0.4 x10E3/uL   Basophils Absolute 0.1 0.0 - 0.2 x10E3/uL   Immature Granulocytes 0 Not Estab. %   Immature Grans (Abs) 0.0 0.0 - 0.1 x10E3/uL        Current Outpatient Medications:    CARDIZEM CD 180 MG 24 hr capsule, Take 1 capsule by mouth daily., Disp: , Rfl:    carvedilol (COREG) 6.25 MG tablet, Take 6.25 mg by mouth 2 (two) times daily., Disp: , Rfl:    losartan (COZAAR) 100 MG tablet, Take 1 tablet by mouth  daily., Disp: , Rfl:    tamsulosin (FLOMAX) 0.4 MG CAPS capsule, Take 1 capsule by mouth daily., Disp: , Rfl:     Assessment & Plan:   WPW syndrome :  His EF in 2012 was 40% but improved to 55% in 2017 Rechecked by Dr. 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 Fu with neurology   HTN is on cardizem 180 mg , losartan and coreg  Continue current meds.  Medication compliance emphasised. pt advised to keep Bp logs. Pt verbalised understanding of the same. Pt to have a low salt diet . Exercise to reach a goal of at least 150 mins a week.  lifestyle modifications explained and pt understands importance of the above.    Problem List Items Addressed This Visit   None    Orders Placed This Encounter  Procedures   Ambulatory referral to Neurology     No orders of the defined types were placed in this encounter.    Follow up plan: No follow-ups on file.

## 2021-06-15 NOTE — Progress Notes (Signed)
° °  SUBJECTIVE Patient presents to office today complaining of pain associated to a nodule to the right plantar forefoot.  Patient states he noticed a knot develop over the past month or so.  It is very tender and symptomatic especially with walking.  He says that he is only noticed it for the past month and he denies a history of injury.  Currently he has not done anything for treatment.  Patient also experiences generalized foot pain bilateral.  Pain with walking.  He is retired but he gets a significant amount of pain throughout the day.  Especially he experiences pain when first getting out of bed in the mornings  Finally the patient complains of elongated, thickened nails that cause pain while ambulating in shoes.  Patient is unable to trim their own nails. Patient is here for further evaluation and treatment.  Past Medical History:  Diagnosis Date   Arthritis    Glaucoma    Hypertension    Wolff-Parkinson-White syndrome     OBJECTIVE General Patient is awake, alert, and oriented x 3 and in no acute distress. Derm Skin is dry and supple bilateral. Negative open lesions or macerations. Remaining integument unremarkable. Nails are tender, long, thickened and dystrophic with subungual debris, consistent with onychomycosis, 1-5 bilateral. No signs of infection noted. Vasc  DP and PT pedal pulses palpable bilaterally. Temperature gradient within normal limits.  Neuro light touch and protective threshold sensation grossly intact bilaterally.  Musculoskeletal Exam pes planus deformity noted with medial longitudinal arch collapse with loading of the foot.  There is also a symptomatic lesion noted along the plantar aspect of the right foot along the plantar fascia consistent with a plantar fibroma about 2 cm in diameter Radiographic exam bilateral feet lateral view demonstrates medial longitudinal arch collapse with a moderate pes planovalgus deformity.  Overall the joint spaces are preserved there  is no significant degenerative changes.  No fractures identified.  Normal osseous mineralization  ASSESSMENT 1.  Pain due to onychomycosis of toenails both 2.  Plantar fibroma right 3.  Pes planus bilateral  PLAN OF CARE 1. Patient evaluated today.  2. Instructed to maintain good pedal hygiene and foot care.  3. Mechanical debridement of nails 1-5 bilaterally performed using a nail nipper. Filed with dremel without incident.  4.  Recommend good supportive insoles that are soft to allow cushion to the plantar fibroma of the right foot. 5.  Patient declined injection 6.  Prescription for meloxicam 15 mg daily as needed 7.  Advised against going barefoot 8.  Mechanical debridement of nails 1-5 bilateral was performed using a nail nipper without incident or bleeding 9.  Return to clinic as needed   Felecia Shelling, DPM [facility]  Dr. Felecia Shelling, DPM    [address] [place], [place] [address]                Office [phone]  Fax [phone]

## 2021-06-21 ENCOUNTER — Ambulatory Visit (INDEPENDENT_AMBULATORY_CARE_PROVIDER_SITE_OTHER): Payer: Medicare HMO

## 2021-06-21 DIAGNOSIS — I428 Other cardiomyopathies: Secondary | ICD-10-CM

## 2021-06-21 LAB — CUP PACEART REMOTE DEVICE CHECK
Date Time Interrogation Session: 20221221234050
Implantable Pulse Generator Implant Date: 20190825

## 2021-06-26 ENCOUNTER — Telehealth: Payer: Self-pay

## 2021-06-26 NOTE — Telephone Encounter (Signed)
ILR has reached RRT 06/25/21.   Called patient to advised device @ RRT 06/25/21.  Patient voiced he would like to have another ILR replaced. Advised pt I will forward to Dr. Quentin Ore for request. Patient agreeable to plan.  Return kit requested.

## 2021-06-27 ENCOUNTER — Encounter: Payer: Self-pay | Admitting: Cardiology

## 2021-06-27 ENCOUNTER — Ambulatory Visit: Payer: Medicare HMO | Admitting: Cardiology

## 2021-06-27 ENCOUNTER — Other Ambulatory Visit: Payer: Self-pay

## 2021-06-27 VITALS — BP 120/64 | HR 60 | Ht 66.93 in | Wt 129.0 lb

## 2021-06-27 DIAGNOSIS — I5022 Chronic systolic (congestive) heart failure: Secondary | ICD-10-CM | POA: Diagnosis not present

## 2021-06-27 DIAGNOSIS — Z4509 Encounter for adjustment and management of other cardiac device: Secondary | ICD-10-CM | POA: Diagnosis not present

## 2021-06-27 DIAGNOSIS — R002 Palpitations: Secondary | ICD-10-CM

## 2021-06-27 DIAGNOSIS — I428 Other cardiomyopathies: Secondary | ICD-10-CM | POA: Diagnosis not present

## 2021-06-27 NOTE — Telephone Encounter (Signed)
Pt has appointment scheduled today 06/27/21.  Loop explant and implant have been authorized.

## 2021-06-27 NOTE — Progress Notes (Signed)
Electrophysiology Office Follow up Visit Note:    Date:  06/27/2021   ID:  Jesus Price, DOB Jun 22, 1961, MRN [id number]  PCP:  Loura Pardon, MD  [facility] Cardiologist:  None  [facility] Electrophysiologist:  Lanier Prude, MD    Interval History:    Jesus Price is a 60 y.o. male who presents for a follow up visit. They were last seen in clinic November 29, 2020.  He has a history of prior EP study in 2019 with no evidence of accessory pathway or inducible SVT.  A loop recorder was implanted at the time which is now reached ERI.  In the past he was followed by [facility] and [facility] [facility] [employer].  Previous symptom triggered events on the loop recorder have revealed paroxysms of atrial tachycardia or PACs. He tells me he is done well since I last saw him.   Past Medical History:  Diagnosis Date   Arthritis    Glaucoma    Hypertension    Wolff-Parkinson-White syndrome     Past Surgical History:  Procedure Laterality Date   EYE SURGERY      Current Medications: Current Meds  Medication Sig   CARDIZEM CD 180 MG 24 hr capsule Take 1 capsule by mouth daily.   carvedilol (COREG) 6.25 MG tablet Take 6.25 mg by mouth 2 (two) times daily.   losartan (COZAAR) 100 MG tablet Take 1 tablet by mouth daily.   tamsulosin (FLOMAX) 0.4 MG CAPS capsule Take 1 capsule by mouth daily.     Allergies:   Patient has no known allergies.   Social History   Socioeconomic History   Marital status: Married    Spouse name: Not on file   Number of children: Not on file   Years of education: Not on file   Highest education level: Not on file  Occupational History   Not on file  Tobacco Use   Smoking status: Every Day    Packs/day: 0.25    Types: Cigarettes   Smokeless tobacco: Never   Tobacco comments:    5 cigarettes per day   Vaping Use   Vaping Use: Never used  Substance and Sexual Activity   Alcohol use: Never   Drug use: Never   Sexual activity: Not Currently  Other  Topics Concern   Not on file  Social History Narrative   Not on file   Social Determinants of Health   Financial Resource Strain: Not on file  Food Insecurity: Not on file  Transportation Needs: Not on file  Physical Activity: Not on file  Stress: Not on file  Social Connections: Not on file     Family History: The patient's family history includes Heart disease in his mother; Lupus in his maternal grandmother and sister.  ROS:   Please see the history of present illness.    All other systems reviewed and are negative.  EKGs/Labs/Other Studies Reviewed:    The following studies were reviewed today:     Recent Labs: 06/08/2021: ALT 8; BUN 19; Creatinine, Ser 1.09; Hemoglobin 14.0; Platelets 213; Potassium 4.7; Sodium 139; TSH 0.938  Recent Lipid Panel    Component Value Date/Time   CHOL 156 06/08/2021 0950   TRIG 54 06/08/2021 0950   HDL 57 06/08/2021 0950   CHOLHDL 2.7 06/08/2021 0950   LDLCALC 88 06/08/2021 0950    Physical Exam:    VS:  BP 120/64    Pulse 60    Ht 5' 6.93" (1.7 m)  Wt 129 lb (58.5 kg)    SpO2 99%    BMI 20.25 kg/m     Wt Readings from Last 3 Encounters:  06/27/21 129 lb (58.5 kg)  06/15/21 125 lb 3.2 oz (56.8 kg)  05/15/21 124 lb 9.6 oz (56.5 kg)     GEN:  Well nourished, well developed in no acute distress.  Thin HEENT: Normal NECK: No JVD; No carotid bruits LYMPHATICS: No lymphadenopathy CARDIAC: RRR, no murmurs, rubs, gallops RESPIRATORY:  Clear to auscultation without rales, wheezing or rhonchi  ABDOMEN: Soft, non-tender, non-distended MUSCULOSKELETAL:  No edema; No deformity  SKIN: Warm and dry NEUROLOGIC:  Alert and oriented x 3 PSYCHIATRIC:  Normal affect        ASSESSMENT:    1. Palpitations   2. NICM (nonischemic cardiomyopathy) (HCC)    PLAN:    In order of problems listed above:  #Palpitations #Loop recorder at [facility] Patient has a history of palpitations and diagnosis of WPW.  Prior EP study without inducible  tachycardia and no evidence of accessory pathway.  He has had a loop recorder in place but it has now reached ERI.  The patient wishes to continue monitoring his heart rhythm which I think is reasonable.  We discussed removal of his loop recorder and reimplantation.  I discussed the procedure in detail and he wishes to proceed.  We will plan to get this done for him today.  #History of nonischemic cardiomyopathy #Chronic systolic heart failure NYHA class I-II.  Mildly reduced left ventricular function on July echo.  Warm and dry on exam today.  He should continue taking carvedilol, losartan. Would recommend rechecking his left ventricular function in approximately 1 year.    Follow-up 1 year with me or APP.  Echo to be repeated at that visit.    Medication Adjustments/Labs and Tests Ordered: Current medicines are reviewed at length with the patient today.  Concerns regarding medicines are outlined above.  No orders of the defined types were placed in this encounter.  No orders of the defined types were placed in this encounter.    Signed, Steffanie Dunn, MD, [facility], [facility] 06/27/2021 9:03 PM    Electrophysiology [facility]   ------------------------------------  SURGEON:  Steffanie Dunn, MD    PREPROCEDURE DIAGNOSIS: Nonischemic cardiomyopathy, palpitations    POSTPROCEDURE DIAGNOSIS: Nonischemic cardiomyopathy, palpitations     PROCEDURES:   1. Implantable loop recorder explantation 2.  Implantable loop recorder implantation nonischemic cardiomyopathy and palpitations    INTRODUCTION:  Jesus Price is a 60 y.o. male with a history of NICM and palpitations who presents today for implantable loop explantation and reimplantation.  The patient previously had a Medtronic Reveal LINQ implanted.  The device has reached end of service time.  The patient therefore presents today for implantable loop explantation and reimplant.     DESCRIPTION OF  PROCEDURE:  Informed written consent was obtained.  The patient required no sedation for the procedure today.   The patients left chest was therefore prepped and draped in the usual sterile fashion.  The skin overlying the ILR monitor was infiltrated with lidocaine for local analgesia.  A 0.5-cm incision was made over the site.  The previously implanted ILR was exposed and removed using a combination of sharp and blunt dissection.  A new Medtronic [id number] was implanted and a new tract in the subcutaneous tissue.  Sensing of R waves was greater than 0.47mV.  Steri- Strips and a sterile dressing were then applied. EBL<90ml.  There were no early apparent complications.     CONCLUSIONS:   1. Successful explantation of a Medtronic Reveal LINQ implantable loop recorder   2.  Successful implant of a Medtronic [id number] implantable loop recorder  2. No early apparent complications.        Steffanie Dunn, MD 06/27/2021 9:03 PM

## 2021-06-27 NOTE — Patient Instructions (Addendum)
Medication Instructions:  Your physician recommends that you continue on your current medications as directed. Please refer to the Current Medication list given to you today.  Labwork: None ordered.  Testing/Procedures: None ordered.  Follow-Up:  Your physician wants you to follow-up in: one year with Dr. Lalla Brothers.  You will receive a reminder letter in the mail two months in advance. If you don't receive a letter, please call our office to schedule the follow-up appointment.    Implantable Loop Recorder Placement, Care After This sheet gives you information about how to care for yourself after your procedure. Your health care provider may also give you more specific instructions. If you have problems or questions, contact your health care provider. What can I expect after the procedure? After the procedure, it is common to have: Soreness or discomfort near the incision. Some swelling or bruising near the incision.  Follow these instructions at home: Incision care   Leave your outer dressing on for 72 hours.  After 72 hours you can remove your outer dressing and shower. Leave adhesive strips in place. These skin closures may need to stay in place for 1-2 weeks. If adhesive strip edges start to loosen and curl up, you may trim the loose edges.  You may remove the strips if they have not fallen off after 2 weeks. Check your incision area every day for signs of infection. Check for: Redness, swelling, or pain. Fluid or blood. Warmth. Pus or a bad smell. Do not take baths, swim, or use a hot tub until your incision is completely healed. If your wound site starts to bleed apply pressure.      If you have any questions/concerns please call the device clinic at [phone].  Activity  Return to your normal activities.  General instructions Follow instructions from your health care provider about how to manage your implantable loop recorder and transmit the information. Learn how to  activate a recording if this is necessary for your type of device. You may go through a metal detection gate, and you may let someone hold a metal detector over your chest. Show your ID card if needed. Do not have an MRI unless you check with your health care provider first. Take over-the-counter and prescription medicines only as told by your health care provider. Keep all follow-up visits as told by your health care provider. This is important. Contact a health care provider if: You have redness, swelling, or pain around your incision. You have a fever. You have pain that is not relieved by your pain medicine. You have triggered your device because of fainting (syncope) or because of a heartbeat that feels like it is racing, slow, fluttering, or skipping (palpitations). Get help right away if you have: Chest pain. Difficulty breathing. Summary After the procedure, it is common to have soreness or discomfort near the incision. Change your dressing as told by your health care provider. Follow instructions from your health care provider about how to manage your implantable loop recorder and transmit the information. Keep all follow-up visits as told by your health care provider. This is important. This information is not intended to replace advice given to you by your health care provider. Make sure you discuss any questions you have with your health care provider. Document Released: 05/29/2015 Document Revised: 08/02/2017 Document Reviewed: 08/02/2017 Elsevier Patient Education  2020 [employer].

## 2021-07-03 NOTE — Progress Notes (Signed)
Carelink Summary Report / Loop Recorder 

## 2021-07-06 ENCOUNTER — Telehealth: Payer: Self-pay | Admitting: Cardiology

## 2021-07-06 ENCOUNTER — Ambulatory Visit (INDEPENDENT_AMBULATORY_CARE_PROVIDER_SITE_OTHER): Payer: Medicare HMO | Admitting: Urology

## 2021-07-06 ENCOUNTER — Encounter: Payer: Self-pay | Admitting: Urology

## 2021-07-06 ENCOUNTER — Other Ambulatory Visit: Payer: Self-pay

## 2021-07-06 VITALS — BP 148/81 | HR 69 | Ht 67.0 in | Wt 128.9 lb

## 2021-07-06 DIAGNOSIS — N5201 Erectile dysfunction due to arterial insufficiency: Secondary | ICD-10-CM | POA: Diagnosis not present

## 2021-07-06 MED ORDER — SILDENAFIL CITRATE 100 MG PO TABS: ORAL_TABLET | ORAL | 3 refills | Status: DC

## 2021-07-06 NOTE — Progress Notes (Signed)
° °  07/06/2021 10:43 AM   Jesus Maduro Vedia Price 08/01/1960 [id number]  Referring provider: Loura Pardon, MD [address] [place],  [place] [address]  Chief Complaint  Patient presents with   Erectile Dysfunction    HPI: Jesus Price is a 61 y.o. male referred for erectile dysfunction.  Previously followed at [facility] for ED and was last seen in 2020 Was on sildenafil 100 mg with good efficacy.  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 If you have any questions, ask your nurse or doctor.          Cardizem CD 180 MG 24 hr capsule Generic drug: diltiazem Take 1 capsule by mouth daily.   carvedilol 6.25 MG tablet Commonly known as: COREG Take 6.25 mg by mouth 2 (two) times daily.   losartan 100 MG tablet Commonly known as: COZAAR Take 1 tablet by mouth daily.   meloxicam 15 MG tablet Commonly known as: MOBIC Take 1 tablet (15 mg total) by mouth daily.   tamsulosin 0.4 MG Caps capsule Commonly known as: FLOMAX Take 1 capsule by mouth daily.        Allergies: No Known Allergies  Family History: Family History  Problem Relation Age of Onset   Heart disease Mother    Lupus Sister    Lupus Maternal Grandmother     Social History:  reports that he has been smoking cigarettes. He has been smoking an average of .25 packs per day. He has never used smokeless tobacco. He reports that he does not drink alcohol and does not use drugs.   Physical Exam: BP (!) 148/81    Pulse  69    Ht 5\' 7"  (1.702 m)    Wt 128 lb 14.4 oz (58.5 kg)    BMI 20.19 kg/m   Constitutional:  Alert and oriented, No acute distress. HEENT: [place] AT, moist mucus membranes.  Trachea midline, no masses. Cardiovascular: No clubbing, cyanosis, or edema. Respiratory: Normal respiratory effort, no increased work of breathing. Psychiatric: Normal mood and affect.   Assessment & Plan:    1.  Erectile dysfunction Prior good efficacy with sildenafil Tolerated without side effects.  He is not on oral or sublingual nitrates We discussed the availability of generic sildenafil 100 mg which is inexpensive at certain pharmacies with a good Rx discount.  Rx was sent to [facility] and he was given coupon Follow-up annually or as needed   , MD  [facility] [address], [address] [place], [place] [phone]

## 2021-07-06 NOTE — Telephone Encounter (Signed)
° °  Pt c/o medication issue:  1. Name of Medication: aspirin 81 mg  2. How are you currently taking this medication (dosage and times per day)?   3. Are you having a reaction (difficulty breathing--STAT)?   4. What is your medication issue? Courtney with [facility] clinic calling, they would like to ask Dr. Quentin Ore if pt can start taking Asprin 81 mg daily.  They will also going to send MRI clearance to device clinic since pt has loop recorder

## 2021-07-09 ENCOUNTER — Encounter: Payer: Self-pay | Admitting: Cardiology

## 2021-07-10 ENCOUNTER — Encounter: Payer: Self-pay | Admitting: Urology

## 2021-07-18 ENCOUNTER — Ambulatory Visit (INDEPENDENT_AMBULATORY_CARE_PROVIDER_SITE_OTHER): Payer: Medicare HMO | Admitting: *Deleted

## 2021-07-18 DIAGNOSIS — Z0000 Encounter for general adult medical examination without abnormal findings: Secondary | ICD-10-CM | POA: Diagnosis not present

## 2021-07-18 NOTE — Progress Notes (Signed)
Subjective:   Jesus Price is a 61 y.o. male who presents for Medicare Annual/Subsequent preventive examination.  I connected with  [name] on 07/18/21 by a telephone enabled telemedicine application and verified that I am speaking with the correct person using two identifiers.   I discussed the limitations of evaluation and management by telemedicine. The patient expressed understanding and agreed to proceed.  Patient location: home  Provider location:  Tele-Health  not in office    Review of Systems     Cardiac Risk Factors include: advanced age (>41men, >17 women);male gender;sedentary lifestyle;smoking/ tobacco exposure;hypertension     Objective:    Today's Vitals   07/18/21 0903  PainSc: 7    There is no height or weight on file to calculate BMI.  Advanced Directives 07/18/2021  Does Patient Have a Medical Advance Directive? No  Would patient like information on creating a medical advance directive? No - Patient declined    Current Medications (verified) Outpatient Encounter Medications as of 07/18/2021  Medication Sig   CARDIZEM CD 180 MG 24 hr capsule Take 1 capsule by mouth daily.   carvedilol (COREG) 6.25 MG tablet Take 6.25 mg by mouth 2 (two) times daily.   losartan (COZAAR) 100 MG tablet Take 1 tablet by mouth daily.   meloxicam (MOBIC) 15 MG tablet Take 1 tablet (15 mg total) by mouth daily.   sildenafil (VIAGRA) 100 MG tablet Take 1 tab 1 hour  prior to incourse   tamsulosin (FLOMAX) 0.4 MG CAPS capsule Take 1 capsule by mouth daily.   No facility-administered encounter medications on file as of 07/18/2021.    Allergies (verified) Patient has no known allergies.   History: Past Medical History:  Diagnosis Date   Arthritis    Glaucoma    Hypertension    Wolff-Parkinson-White syndrome    Past Surgical History:  Procedure Laterality Date   EYE SURGERY     Family History  Problem Relation Age of Onset   Heart disease Mother     Lupus Sister    Lupus Maternal Grandmother    Social History   Socioeconomic History   Marital status: Married    Spouse name: Not on file   Number of children: Not on file   Years of education: Not on file   Highest education level: Not on file  Occupational History   Not on file  Tobacco Use   Smoking status: Every Day    Packs/day: 0.25    Types: Cigarettes   Smokeless tobacco: Never   Tobacco comments:    5 cigarettes per day   Vaping Use   Vaping Use: Never used  Substance and Sexual Activity   Alcohol use: Never   Drug use: Never   Sexual activity: Not Currently  Other Topics Concern   Not on file  Social History Narrative   Not on file   Social Determinants of Health   Financial Resource Strain: Medium Risk   Difficulty of Paying Living Expenses: Somewhat hard  Food Insecurity: Food Insecurity Present   Worried About Programme researcher, broadcasting/film/video in the Last Year: Often true   Barista in the Last Year: Often true  Transportation Needs: No Transportation Needs   Lack of Transportation (Medical): No   Lack of Transportation (Non-Medical): No  Physical Activity: Inactive   Days of Exercise per Week: 0 days   Minutes of Exercise per Session: 0 min  Stress: No Stress Concern Present   Feeling  of Stress : Only a little  Social Connections: Moderately Isolated   Frequency of Communication with Friends and Family: Once a week   Frequency of Social Gatherings with Friends and Family: Once a week   Attends Religious Services: 1 to 4 times per year   Active Member of [employer] or Organizations: No   Attends Engineer, structural: Never   Marital Status: Married    Tobacco Counseling Ready to quit: Not Answered Counseling given: Not Answered Tobacco comments: 5 cigarettes per day    Clinical Intake:  Pre-visit preparation completed: Yes  Pain : 0-10 Pain Score: 7  Pain Type: Chronic pain Pain Location: Back Pain Descriptors / Indicators: Constant,  Burning, Aching, Sharp Pain Onset: More than a month ago Pain Frequency: Constant     Nutritional Risks: None Diabetes: No  How often do you need to have someone help you when you read instructions, pamphlets, or other written materials from your doctor or pharmacy?: 1 - Never  Diabetic?  no  Interpreter Needed?: No  Information entered by :: Remi Haggard LPN   Activities of Daily Living In your present state of health, do you have any difficulty performing the following activities: 07/18/2021  Hearing? N  Vision? N  Difficulty concentrating or making decisions? N  Walking or climbing stairs? Y  Dressing or bathing? N  Doing errands, shopping? N  Preparing Food and eating ? N  Using the Toilet? N  In the past six months, have you accidently leaked urine? N  Do you have problems with loss of bowel control? N  Managing your Medications? N  Managing your Finances? N  Housekeeping or managing your Housekeeping? N  Some recent data might be hidden    Patient Care Team: Loura Pardon, MD as PCP - General (Internal Medicine) Lanier Prude, MD as PCP - Electrophysiology (Cardiology)  Indicate any recent Medical Services you may have received from other than [facility] providers in the past year (date may be approximate).     Assessment:   This is a routine wellness examination for Jesus Price.  Hearing/Vision screen Hearing Screening - Comments:: No trouble hearing  Vision Screening - Comments:: Up to Date    Dietary issues and exercise activities discussed: Current Exercise Habits: The patient does not participate in regular exercise at present, Exercise limited by: cardiac condition(s);orthopedic condition(s)   Goals Addressed             This Visit's Progress    Patient Stated       Would like to decrease some of pain in back       Depression Screen PHQ 2/9 Scores 07/18/2021 05/15/2021  PHQ - 2 Score 0 5  PHQ- 9 Score - 14    Fall Risk Fall Risk  07/18/2021  05/15/2021  Falls in the past year? 0 0  Number falls in past yr: 0 0  Injury with Fall? 0 0  Risk for fall due to : - No Fall Risks  Follow up Falls evaluation completed;Falls prevention discussed Falls evaluation completed    FALL RISK PREVENTION PERTAINING TO THE HOME:  Any stairs in or around the home? No  If so, are there any without handrails? No  Home free of loose throw rugs in walkways, pet beds, electrical cords, etc? Yes  Adequate lighting in your home to reduce risk of falls? Yes   ASSISTIVE DEVICES UTILIZED TO PREVENT FALLS:  Life alert? No  Use of a cane, walker or w/c? No  Grab bars in the bathroom? No  Shower chair or bench in shower? No  Elevated toilet seat or a handicapped toilet? No   TIMED UP AND GO:  Was the test performed? No .    Cognitive Function: Normal cognitive status assessed by direct observation by this Nurse Health Advisor. No abnormalities found.          Immunizations Immunization History  Administered Date(s) Administered   Influenza,inj,Quad PF,6+ Mos 05/15/2021   PFIZER Comirnaty(Gray Top)Covid-19 Tri-Sucrose Vaccine 11/01/2019, 11/22/2019   Pneumococcal Polysaccharide-23 01/19/2016   Td 06/27/2000   Tdap 12/11/2012    TDAP status: Up to date  Flu Vaccine status: Up to date  Pneumococcal vaccine status: Due, Education has been provided regarding the importance of this vaccine. Advised may receive this vaccine at local pharmacy or Health Dept. Aware to provide a copy of the vaccination record if obtained from local pharmacy or Health Dept. Verbalized acceptance and understanding.  Covid-19 vaccine status: Information provided on how to obtain vaccines.   Qualifies for Shingles Vaccine? Yes   Zostavax completed No   Shingrix Completed?: No.    Education has been provided regarding the importance of this vaccine. Patient has been advised to call insurance company to determine out of pocket expense if they have not yet received  this vaccine. Advised may also receive vaccine at local pharmacy or Health Dept. Verbalized acceptance and understanding.  Screening Tests Health Maintenance  Topic Date Due   HIV Screening  Never done   Zoster Vaccines- Shingrix (1 of 2) Never done   Pneumococcal Vaccine 57-50 Years old (2 - PCV) 01/18/2017   COVID-19 Vaccine (3 - Pfizer risk series) 12/20/2019   TETANUS/TDAP  12/12/2022   COLONOSCOPY (Pts 45-9yrs Insurance coverage will need to be confirmed)  07/01/2026   INFLUENZA VACCINE  Completed   Hepatitis C Screening  Completed   HPV VACCINES  Aged Out    Health Maintenance  Health Maintenance Due  Topic Date Due   HIV Screening  Never done   Zoster Vaccines- Shingrix (1 of 2) Never done   Pneumococcal Vaccine 39-12 Years old (2 - PCV) 01/18/2017   COVID-19 Vaccine (3 - Pfizer risk series) 12/20/2019    Colorectal cancer screening: Type of screening: Colonoscopy. Completed  . Repeat every due 2025    this is per patient  years  Lung Cancer Screening: (Low Dose CT Chest recommended if Age 62-80 years, 30 pack-year currently smoking OR have quit w/in 15years.) does not qualify.   Lung Cancer Screening Referral:   Additional Screening:  Hepatitis C Screening:  Chronic Hep C  Vision Screening: Recommended annual ophthalmology exams for early detection of glaucoma and other disorders of the eye. Is the patient up to date with their annual eye exam?  No  Who is the provider or what is the name of the office in which the patient attends annual eye exams?  If pt is not established with a provider, would they like to be referred to a provider to establish care? No .   Dental Screening: Recommended annual dental exams for proper oral hygiene  Community Resource Referral / Chronic Care Management: CRR required this visit?  No   CCM required this visit?  No      Plan:     I have personally reviewed and noted the following in the patients chart:   Medical and  social history Use of alcohol, tobacco or illicit drugs  Current medications and supplements including opioid prescriptions.  Patient is not currently taking opioid prescriptions. Functional ability and status Nutritional status Physical activity Advanced directives List of other physicians Hospitalizations, surgeries, and ER visits in previous 12 months Vitals Screenings to include cognitive, depression, and falls Referrals and appointments  In addition, I have reviewed and discussed with patient certain preventive protocols, quality metrics, and best practice recommendations. A written personalized care plan for preventive services as well as general preventive health recommendations were provided to patient.     Remi Haggard, LPN   1/61/0960   Nurse Notes:

## 2021-07-18 NOTE — Patient Instructions (Signed)
[name] , Thank you for taking time to come for your Medicare Wellness Visit. I appreciate your ongoing commitment to your health goals. Please review the following plan we discussed and let me know if I can assist you in the future.   Screening recommendations/referrals: Colonoscopy: Education provided Recommended yearly ophthalmology/optometry visit for glaucoma screening and checkup Recommended yearly dental visit for hygiene and checkup  Vaccinations: Influenza vaccine: up to date Pneumococcal vaccine: Education provided Tdap vaccine: up to date Shingles vaccine: Education provided    Advanced directives: Education provided  Conditions/risks identified:   Next appointment: 3-7- @ 8:40  Vigg  Preventive Care 40-64 Years, Male Preventive care refers to lifestyle choices and visits with your health care provider that can promote health and wellness. What does preventive care include? A yearly physical exam. This is also called an annual well check. Dental exams once or twice a year. Routine eye exams. Ask your health care provider how often you should have your eyes checked. Personal lifestyle choices, including: Daily care of your teeth and gums. Regular physical activity. Eating a healthy diet. Avoiding tobacco and drug use. Limiting alcohol use. Practicing safe sex. Taking low-dose aspirin every day starting at age 77. What happens during an annual well check? The services and screenings done by your health care provider during your annual well check will depend on your age, overall health, lifestyle risk factors, and family history of disease. Counseling  Your health care provider may ask you questions about your: Alcohol use. Tobacco use. Drug use. Emotional well-being. Home and relationship well-being. Sexual activity. Eating habits. Work and work Astronomer. Screening  You may have the following tests or measurements: Height, weight, and BMI. Blood  pressure. Lipid and cholesterol levels. These may be checked every 5 years, or more frequently if you are over 60 years old. Skin check. Lung cancer screening. You may have this screening every year starting at age 51 if you have a 30-pack-year history of smoking and currently smoke or have quit within the past 15 years. Fecal occult blood test (FOBT) of the stool. You may have this test every year starting at age 76. Flexible sigmoidoscopy or colonoscopy. You may have a sigmoidoscopy every 5 years or a colonoscopy every 10 years starting at age 76. Prostate cancer screening. Recommendations will vary depending on your family history and other risks. Hepatitis C blood test. Hepatitis B blood test. Sexually transmitted disease (STD) testing. Diabetes screening. This is done by checking your blood sugar (glucose) after you have not eaten for a while (fasting). You may have this done every 1-3 years. Discuss your test results, treatment options, and if necessary, the need for more tests with your health care provider. Vaccines  Your health care provider may recommend certain vaccines, such as: Influenza vaccine. This is recommended every year. Tetanus, diphtheria, and acellular pertussis (Tdap, Td) vaccine. You may need a Td booster every 10 years. Zoster vaccine. You may need this after age 17. Pneumococcal 13-valent conjugate (PCV13) vaccine. You may need this if you have certain conditions and have not been vaccinated. Pneumococcal polysaccharide (PPSV23) vaccine. You may need one or two doses if you smoke cigarettes or if you have certain conditions. Talk to your health care provider about which screenings and vaccines you need and how often you need them. This information is not intended to replace advice given to you by your health care provider. Make sure you discuss any questions you have with your health care provider. Document  Released: 07/14/2015 Document Revised: 03/06/2016 Document  Reviewed: 04/18/2015 Elsevier Interactive Patient Education  2017 [employer].  Fall Prevention in the Home Falls can cause injuries. They can happen to people of all ages. There are many things you can do to make your home safe and to help prevent falls. What can I do on the outside of my home? Regularly fix the edges of walkways and driveways and fix any cracks. Remove anything that might make you trip as you walk through a door, such as a raised step or threshold. Trim any bushes or trees on the path to your home. Use bright outdoor lighting. Clear any walking paths of anything that might make someone trip, such as rocks or tools. Regularly check to see if handrails are loose or broken. Make sure that both sides of any steps have handrails. Any raised decks and porches should have guardrails on the edges. Have any leaves, snow, or ice cleared regularly. Use sand or salt on walking paths during winter. Clean up any spills in your garage right away. This includes oil or grease spills. What can I do in the bathroom? Use night lights. Install grab bars by the toilet and in the tub and shower. Do not use towel bars as grab bars. Use non-skid mats or decals in the tub or shower. If you need to sit down in the shower, use a plastic, non-slip stool. Keep the floor dry. Clean up any water that spills on the floor as soon as it happens. Remove soap buildup in the tub or shower regularly. Attach bath mats securely with double-sided non-slip rug tape. Do not have throw rugs and other things on the floor that can make you trip. What can I do in the bedroom? Use night lights. Make sure that you have a light by your bed that is easy to reach. Do not use any sheets or blankets that are too big for your bed. They should not hang down onto the floor. Have a firm chair that has side arms. You can use this for support while you get dressed. Do not have throw rugs and other things on the floor that can  make you trip. What can I do in the kitchen? Clean up any spills right away. Avoid walking on wet floors. Keep items that you use a lot in easy-to-reach places. If you need to reach something above you, use a strong step stool that has a grab bar. Keep electrical cords out of the way. Do not use floor polish or wax that makes floors slippery. If you must use wax, use non-skid floor wax. Do not have throw rugs and other things on the floor that can make you trip. What can I do with my stairs? Do not leave any items on the stairs. Make sure that there are handrails on both sides of the stairs and use them. Fix handrails that are broken or loose. Make sure that handrails are as long as the stairways. Check any carpeting to make sure that it is firmly attached to the stairs. Fix any carpet that is loose or worn. Avoid having throw rugs at the top or bottom of the stairs. If you do have throw rugs, attach them to the floor with carpet tape. Make sure that you have a light switch at the top of the stairs and the bottom of the stairs. If you do not have them, ask someone to add them for you. What else can I do to help  prevent falls? Wear shoes that: Do not have high heels. Have rubber bottoms. Are comfortable and fit you well. Are closed at the toe. Do not wear sandals. If you use a stepladder: Make sure that it is fully opened. Do not climb a closed stepladder. Make sure that both sides of the stepladder are locked into place. Ask someone to hold it for you, if possible. Clearly mark and make sure that you can see: Any grab bars or handrails. First and last steps. Where the edge of each step is. Use tools that help you move around (mobility aids) if they are needed. These include: Canes. Walkers. Scooters. Crutches. Turn on the lights when you go into a dark area. Replace any light bulbs as soon as they burn out. Set up your furniture so you have a clear path. Avoid moving your furniture  around. If any of your floors are uneven, fix them. If there are any pets around you, be aware of where they are. Review your medicines with your doctor. Some medicines can make you feel dizzy. This can increase your chance of falling. Ask your doctor what other things that you can do to help prevent falls. This information is not intended to replace advice given to you by your health care provider. Make sure you discuss any questions you have with your health care provider. Document Released: 04/13/2009 Document Revised: 11/23/2015 Document Reviewed: 07/22/2014 Elsevier Interactive Patient Education  2017 [employer].

## 2021-07-27 NOTE — Telephone Encounter (Signed)
Jesus Price from [facility] called to check on status of call from 07/16/21.

## 2021-07-27 NOTE — Telephone Encounter (Signed)
Linton Ham, RN to Jesus Price   07/09/2309:23 AM read Hello Mr. 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     If the facility doing the procedure has any concerns they can reach out to [place] via fax at [phone].     Amy, RN Device clinic

## 2021-07-30 NOTE — Telephone Encounter (Signed)
Okay to take Asprin 81 mg daily per MD Quentin Ore and gave fax number for the device clinic if they need more information for the loop device.

## 2021-08-01 ENCOUNTER — Encounter: Payer: Medicare HMO | Admitting: Cardiology

## 2021-08-01 ENCOUNTER — Telehealth: Payer: Self-pay | Admitting: *Deleted

## 2021-08-01 NOTE — Telephone Encounter (Signed)
° °  Telephone encounter was:  Unsuccessful.  08/01/2021 Name: Noyan Toledo MRN: [id number] DOB: 18-Feb-1961  Unsuccessful outbound call made today to assist with:  Food Insecurity  Outreach Attempt:  1st Attempt  A HIPAA compliant voice message was left requesting a return call.  Instructed patient to call back at   Instructed patient to call back at [phone]  at their earliest convenience. .  [place], Care Management  [phone] [address] , [place] [address] Email : [email or web address]

## 2021-08-03 ENCOUNTER — Other Ambulatory Visit (HOSPITAL_COMMUNITY): Payer: Self-pay | Admitting: Physician Assistant

## 2021-08-03 ENCOUNTER — Other Ambulatory Visit: Payer: Self-pay | Admitting: Physician Assistant

## 2021-08-03 DIAGNOSIS — I639 Cerebral infarction, unspecified: Secondary | ICD-10-CM

## 2021-08-06 ENCOUNTER — Ambulatory Visit (INDEPENDENT_AMBULATORY_CARE_PROVIDER_SITE_OTHER): Payer: Medicare HMO

## 2021-08-06 DIAGNOSIS — I428 Other cardiomyopathies: Secondary | ICD-10-CM

## 2021-08-07 LAB — CUP PACEART REMOTE DEVICE CHECK
Date Time Interrogation Session: 20230205231204
Implantable Pulse Generator Implant Date: 20221228

## 2021-08-08 ENCOUNTER — Telehealth: Payer: Self-pay | Admitting: *Deleted

## 2021-08-08 NOTE — Telephone Encounter (Signed)
° °  Telephone encounter was:  Unsuccessful.  08/08/2021 Name: Lauris Serviss MRN: [id number] DOB: 1960/12/22  Unsuccessful outbound call made today to assist with:  Food Insecurity  Outreach Attempt:  2nd Attempt  A HIPAA compliant voice message was left requesting a return call.  Instructed patient to call back at   Instructed patient to call back at [phone]  at their earliest convenience. Yehuda Mao Greenauer -[facility] Guide , Embedded Care Coordination [facility], Care Management  [phone] [address] [place] , [place] [address] Email : [email or web address]

## 2021-08-09 ENCOUNTER — Telehealth: Payer: Self-pay | Admitting: *Deleted

## 2021-08-09 NOTE — Progress Notes (Signed)
Carelink Summary Report / Loop Recorder 

## 2021-08-09 NOTE — Telephone Encounter (Signed)
° °  Telephone encounter was:  Successful.  08/09/2021 Name: Jesus Price MRN: [id number] DOB: April 04, 1961  Jesus Price is a 61 y.o. year old male who is a primary care patient of Vigg, Avanti, MD . The community resource team was consulted for assistance with Food Insecurity and [place] guide performed the following interventions: Patient provided with information about care guide support team and interviewed to confirm resource needs Follow up call placed to community resources to determine status of patients referral.Will send patient dental resources and food banks as well the out of the garden , will send link to do the Low income energy assistance program , wife will apply ,will send also information on Senior resources of [place] and [employer] silver sneakers all via email  Follow Up Plan:  Care guide will follow up with patient by phone over the next days [place] , [place], Care Management  [phone] [address] , [place] [address] Email : [email or web address]

## 2021-08-10 ENCOUNTER — Telehealth: Payer: Self-pay | Admitting: *Deleted

## 2021-08-10 NOTE — Telephone Encounter (Signed)
° °  Telephone encounter was:  Successful.  08/10/2021 Name: Jesus Price MRN: [id number] DOB: 1961-03-03  [name] is a 61 y.o. year old male who is a primary care patient of Vigg, Avanti, MD . The community resource team was consulted for assistance with Transportation Needs  and Food Insecurity  Care guide performed the following interventions: Patient provided with information about care guide support team and interviewed to confirm resource needs.Patient requested I mail the paperwork to him  Follow Up Plan:  No further follow up planned at this time. The patient has been provided with needed resources.  Alois Cliche -[facility] Guide , Embedded Care Coordination [facility], Care Management  [phone] [address] [place] , [place] [address] Email : [email or web address]

## 2021-08-13 ENCOUNTER — Ambulatory Visit
Admission: RE | Admit: 2021-08-13 | Discharge: 2021-08-13 | Disposition: A | Discharge status: Home / Self Care | Payer: Medicare HMO | Source: Ambulatory Visit | Attending: Physician Assistant | Admitting: Physician Assistant

## 2021-08-13 ENCOUNTER — Other Ambulatory Visit: Payer: Self-pay

## 2021-08-13 DIAGNOSIS — I614 Nontraumatic intracerebral hemorrhage in cerebellum: Secondary | ICD-10-CM | POA: Insufficient documentation

## 2021-08-13 DIAGNOSIS — R9082 White matter disease, unspecified: Secondary | ICD-10-CM | POA: Insufficient documentation

## 2021-08-13 DIAGNOSIS — I639 Cerebral infarction, unspecified: Secondary | ICD-10-CM

## 2021-09-07 ENCOUNTER — Ambulatory Visit: Payer: Medicare HMO | Admitting: Podiatry

## 2021-09-10 ENCOUNTER — Ambulatory Visit (INDEPENDENT_AMBULATORY_CARE_PROVIDER_SITE_OTHER): Payer: Medicare HMO

## 2021-09-10 DIAGNOSIS — I428 Other cardiomyopathies: Secondary | ICD-10-CM

## 2021-09-11 LAB — CUP PACEART REMOTE DEVICE CHECK
Date Time Interrogation Session: 20230312230337
Implantable Pulse Generator Implant Date: 20221228

## 2021-09-14 ENCOUNTER — Ambulatory Visit (INDEPENDENT_AMBULATORY_CARE_PROVIDER_SITE_OTHER): Payer: Medicare HMO | Admitting: Internal Medicine

## 2021-09-14 ENCOUNTER — Other Ambulatory Visit: Payer: Self-pay

## 2021-09-14 ENCOUNTER — Encounter: Payer: Self-pay | Admitting: Internal Medicine

## 2021-09-14 VITALS — BP 130/70 | HR 64 | Temp 98.6°F | Ht 66.93 in | Wt 124.6 lb

## 2021-09-14 DIAGNOSIS — E119 Type 2 diabetes mellitus without complications: Secondary | ICD-10-CM

## 2021-09-14 DIAGNOSIS — F339 Major depressive disorder, recurrent, unspecified: Secondary | ICD-10-CM | POA: Diagnosis not present

## 2021-09-14 DIAGNOSIS — Z125 Encounter for screening for malignant neoplasm of prostate: Secondary | ICD-10-CM | POA: Insufficient documentation

## 2021-09-14 DIAGNOSIS — F419 Anxiety disorder, unspecified: Secondary | ICD-10-CM

## 2021-09-14 DIAGNOSIS — G459 Transient cerebral ischemic attack, unspecified: Secondary | ICD-10-CM

## 2021-09-14 DIAGNOSIS — R531 Weakness: Secondary | ICD-10-CM | POA: Diagnosis not present

## 2021-09-14 DIAGNOSIS — I10 Essential (primary) hypertension: Secondary | ICD-10-CM | POA: Diagnosis not present

## 2021-09-14 DIAGNOSIS — I456 Pre-excitation syndrome: Secondary | ICD-10-CM

## 2021-09-14 MED ORDER — ASPIRIN 81 MG PO TBEC: 81.0000 mg | DELAYED_RELEASE_TABLET | Freq: Every day | ORAL | 12 refills | Status: DC

## 2021-09-14 NOTE — Progress Notes (Signed)
? ?  BP 130/70   Pulse 64   Temp 98.6 ?F (37 ?C) (Oral)   Ht 5' 6.93" (1.7 m)   Wt 124 lb 9.6 oz (56.5 kg)   SpO2 99%   BMI 19.56 kg/m?   ? ?Subjective:  ? ? Patient ID: Jesus Price, male    DOB: 1960-10-02, 61 y.o.   MRN: [id number] ? ?Chief Complaint  ?Patient presents with  ? Transient Ischemic Attack  ?  Follow up  ? ? ?HPI: ?Jesus Price is a 61 y.o. male ? ?HPI ? ?Chief Complaint  ?Patient presents with  ? Transient Ischemic Attack  ?  Follow up  ? ? ?Relevant past medical, surgical, family and social history reviewed and updated as indicated. Interim medical history since our last visit reviewed. ?Allergies and medications reviewed and updated. ? ?Review of Systems ? ?Per HPI unless specifically indicated above ? ?   ?Objective:  ?  ?BP 130/70   Pulse 64   Temp 98.6 ?F (37 ?C) (Oral)   Ht 5' 6.93" (1.7 m)   Wt 124 lb 9.6 oz (56.5 kg)   SpO2 99%   BMI 19.56 kg/m?   ?Wt Readings from Last 3 Encounters:  ?09/14/21 124 lb 9.6 oz (56.5 kg)  ?07/06/21 128 lb 14.4 oz (58.5 kg)  ?06/27/21 129 lb (58.5 kg)  ?  ?Physical Exam ? ?Results for orders placed or performed in visit on 09/10/21  ?CUP PACEART REMOTE DEVICE CHECK  ?Result Value Ref Range  ? Date Time Interrogation Session [id number]   ? Pulse Oncologist MERM   ? Pulse Gen Model [id number] II   ? Pulse Gen Serial Number [id number]   ? Clinic Name [facility]   ? Implantable Pulse Generator Type ICM/ILR   ? Implantable Pulse Generator Implant Date [id number]   ? Eval Rhythm SR at 60 bpm   ? ?   ? ? ?Current Outpatient Medications:  ?  CARDIZEM CD 180 MG 24 hr capsule, Take 1 capsule by mouth daily., Disp: , Rfl:  ?  carvedilol (COREG) 6.25 MG tablet, Take 6.25 mg by mouth 2 (two) times daily., Disp: , Rfl:  ?  losartan (COZAAR) 100 MG tablet, Take 1 tablet by mouth daily., Disp: , Rfl:  ?  meloxicam (MOBIC) 15 MG tablet, Take 1 tablet (15 mg total) by mouth daily., Disp: 30 tablet, Rfl: 1 ?  sildenafil (VIAGRA) 100 MG  tablet, Take 1 tab 1 hour  prior to incourse, Disp: 30 tablet, Rfl: 3 ?  tamsulosin (FLOMAX) 0.4 MG CAPS capsule, Take 1 capsule by mouth daily., Disp: , Rfl:   ? ? ?Assessment & Plan:  ?TIA sees neurology :  ?has right lower extremity weakness  ?Will set up with PT ?1. No acute intracranial abnormality. ?2. Moderately advanced but nonspecific cerebral white matter signal ?changes. 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 ?  ?WPW  ?Will need  ? ?Problem List Items Addressed This Visit   ?None ?  ? ?No orders of the defined types were placed in this encounter. ?  ? ?No orders of the defined types were placed in this encounter. ?  ? ?Follow up plan: ?No follow-ups on file. ? ? ? ?

## 2021-09-15 LAB — CBC WITH DIFFERENTIAL/PLATELET
Basophils Absolute: 0 10*3/uL (ref 0.0–0.2)
Basos: 1 %
EOS (ABSOLUTE): 0.1 10*3/uL (ref 0.0–0.4)
Eos: 2 %
Hematocrit: 41.9 % (ref 37.5–51.0)
Hemoglobin: 14.2 g/dL (ref 13.0–17.7)
Immature Grans (Abs): 0 10*3/uL (ref 0.0–0.1)
Immature Granulocytes: 0 %
Lymphocytes Absolute: 2 10*3/uL (ref 0.7–3.1)
Lymphs: 45 %
MCH: 32.1 pg (ref 26.6–33.0)
MCHC: 33.9 g/dL (ref 31.5–35.7)
MCV: 95 fL (ref 79–97)
Monocytes Absolute: 0.2 10*3/uL (ref 0.1–0.9)
Monocytes: 5 %
Neutrophils Absolute: 2.1 10*3/uL (ref 1.4–7.0)
Neutrophils: 47 %
Platelets: 200 10*3/uL (ref 150–450)
RBC: 4.43 x10E6/uL (ref 4.14–5.80)
RDW: 12.6 % (ref 11.6–15.4)
WBC: 4.5 10*3/uL (ref 3.4–10.8)

## 2021-09-15 LAB — COMPREHENSIVE METABOLIC PANEL
ALT: 11 IU/L (ref 0–44)
AST: 14 IU/L (ref 0–40)
Albumin/Globulin Ratio: 1.6 (ref 1.2–2.2)
Albumin: 4.5 g/dL (ref 3.8–4.8)
Alkaline Phosphatase: 75 IU/L (ref 44–121)
BUN/Creatinine Ratio: 11 (ref 10–24)
BUN: 13 mg/dL (ref 8–27)
Bilirubin Total: 0.4 mg/dL (ref 0.0–1.2)
CO2: 27 mmol/L (ref 20–29)
Calcium: 9.3 mg/dL (ref 8.6–10.2)
Chloride: 102 mmol/L (ref 96–106)
Creatinine, Ser: 1.17 mg/dL (ref 0.76–1.27)
Globulin, Total: 2.8 g/dL (ref 1.5–4.5)
Glucose: 68 mg/dL — ABNORMAL LOW (ref 70–99)
Potassium: 4.5 mmol/L (ref 3.5–5.2)
Sodium: 140 mmol/L (ref 134–144)
Total Protein: 7.3 g/dL (ref 6.0–8.5)
eGFR: 70 mL/min/{1.73_m2} (ref >59)

## 2021-09-15 LAB — THYROID PANEL WITH TSH
Free Thyroxine Index: 2.6 (ref 1.2–4.9)
T3 Uptake Ratio: 27 % (ref 24–39)
T4, Total: 9.8 ug/dL (ref 4.5–12.0)
TSH: 1.62 u[IU]/mL (ref 0.450–4.500)

## 2021-09-15 LAB — PSA: Prostate Specific Ag, Serum: 0.2 ng/mL (ref 0.0–4.0)

## 2021-09-15 LAB — LIPID PANEL
Chol/HDL Ratio: 2.9 ratio (ref 0.0–5.0)
Cholesterol, Total: 157 mg/dL (ref 100–199)
HDL: 55 mg/dL (ref >39)
LDL Chol Calc (NIH): 93 mg/dL (ref 0–99)
Triglycerides: 42 mg/dL (ref 0–149)
VLDL Cholesterol Cal: 9 mg/dL (ref 5–40)

## 2021-09-15 LAB — HEMOGLOBIN A1C
Est. average glucose Bld gHb Est-mCnc: 108 mg/dL
Hgb A1c MFr Bld: 5.4 % (ref 4.8–5.6)

## 2021-09-17 ENCOUNTER — Telehealth: Payer: Self-pay | Admitting: *Deleted

## 2021-09-17 NOTE — Chronic Care Management (AMB) (Signed)
?  Chronic Care Management  ? ?Outreach Note ? ?09/17/2021 ?Name: [name] MRN: [id number] DOB: February 05, 1961 ? ?Jesus Price is a 61 y.o. year old male who is a primary care patient of Vigg, Avanti, MD. I reached out to [name] by phone today in response to a referral sent by Mr. Kennith Center Marchiano's primary care provider. ? ?An unsuccessful telephone outreach was attempted today. The patient was referred to the case management team for assistance with care management and care coordination.  ? ?Follow Up Plan: A HIPAA compliant phone message was left for the patient providing contact information and requesting a return call.  ?The care management team will reach out to the patient again over the next 7 days.  ?If patient returns call to provider office, please advise to call Embedded Care Management Care Guide Misty Stanley* at [phone].* ? ?Gwenevere Ghazi  ?Care Guide, Embedded Care Coordination ?[place]  Care Management  ?Direct Dial: [phone] ? ?

## 2021-09-19 ENCOUNTER — Encounter: Payer: Self-pay | Admitting: Internal Medicine

## 2021-09-19 NOTE — Chronic Care Management (AMB) (Signed)
?  Chronic Care Management  ? ?Note ? ?09/19/2021 ?Name: Jesus Price MRN: [id number] DOB: 1960/08/01 ? ?Jesus Price is a 61 y.o. year old male who is a primary care patient of Vigg, Avanti, MD. I reached out to Illa Level by phone today in response to a referral sent by Jesus Price's PCP. ? ?Jesus Price was given information about Chronic Care Management services today including:  ?CCM service includes personalized support from designated clinical staff supervised by his physician, including individualized plan of care and coordination with other care providers ?24/7 contact phone numbers for assistance for urgent and routine care needs. ?Service will only be billed when office clinical staff spend 20 minutes or more in a month to coordinate care. ?Only one practitioner may furnish and bill the service in a calendar month. ?The patient may stop CCM services at any time (effective at the end of the month) by phone call to the office staff. ?The patient is responsible for co-pay (up to 20% after annual deductible is met) if co-pay is required by the individual health plan.  ? ?Patient agreed to services and verbal consent obtained.  ? ?Follow up plan: ?Telephone appointment with care management team member scheduled for:09/21/21 ? ?Jesus Price  ?Care Guide, Embedded Care Coordination ?[place]  Care Management  ?Direct Dial: [phone] ? ?

## 2021-09-20 ENCOUNTER — Telehealth: Payer: Medicare HMO | Admitting: *Deleted

## 2021-09-20 ENCOUNTER — Telehealth: Payer: Self-pay | Admitting: *Deleted

## 2021-09-20 ENCOUNTER — Ambulatory Visit: Payer: Medicare HMO | Admitting: Internal Medicine

## 2021-09-20 NOTE — Patient Outreach (Signed)
?  Care Management  ? ?Follow Up Note ? ? ?09/20/2021 ? ?Name: Jesus Price MRN: [id number] DOB: 1960/10/05 ? ?Referred By: Loura Pardon, MD ? ?Reason for Referral:  Chronic Care Management Needs in Patient with Anxiety, Depression, and Chronic Pain Syndrome. ? ?An unsuccessful telephone outreach was attempted today. The patient was referred to the case management team for assistance with care management and care coordination. A HIPAA compliant message was left on voicemail, providing contact information, encouraging patient to return LCSW's call at his earliest convenience.  LCSW will make a second initial telephone outreach call attempt within the next 5-7 business days, if a return call is not received from patient in the meantime. ? ?Follow-Up Plan:  09/27/2021 at 10:00 am ? ?Danford Bad, BSW, MSW, LCSW  ?Licensed Clinical Social Worker  ?[employer] Customer service manager Care Management ?[facility]  ?Mailing Address-[address] [place], [place] [address] ?Physical Address-[address] [place], [place], [place] [address] ?Toll Free Main # [phone] ?Fax # [phone] ?Cell # [phone] ?[email or web address] ? ? ? ? ? ?  ?

## 2021-09-21 ENCOUNTER — Other Ambulatory Visit: Payer: Medicare HMO | Admitting: *Deleted

## 2021-09-21 ENCOUNTER — Telehealth: Payer: Medicare HMO

## 2021-09-24 ENCOUNTER — Encounter: Payer: Self-pay | Admitting: *Deleted

## 2021-09-24 NOTE — Clinical Social Work Note (Signed)
?Chronic Care Management  ? ? Clinical Social Work Note ? ?09/24/2021 ?Name: Jesus Price MRN: [id number] DOB: [birth year] ? ?Jesus Price is a 61 y.o. year old male who is a primary care patient of Vigg, Avanti, MD. The CCM team was consulted to assist the patient with chronic disease management and/or care coordination needs related to: [employer] and Mental Health Counseling and Resources.  ? ?Engaged with patient by telephone for initial visit in response to provider referral for social work chronic care management and care coordination services.  ? ?Consent to Services:  ?The patient was given information about Chronic Care Management services, agreed to services, and gave verbal consent prior to initiation of services.  Please see initial visit note for detailed documentation.  ? ?Patient agreed to services and consent obtained.  ? ?Assessment: Review of patient past medical history, allergies, medications, and health status, including review of relevant consultants reports was performed today as part of a comprehensive evaluation and provision of chronic care management and care coordination services.    ? ?SDOH (Social Determinants of Health) assessments and interventions performed:  ?SDOH Interventions   ? ?Flowsheet Row Most Recent Value  ?SDOH Interventions   ?Food Insecurity Interventions Intervention Not Indicated  ?Financial Strain Interventions Intervention Not Indicated  ?Housing Interventions Intervention Not Indicated  ?Intimate Partner Violence Interventions Intervention Not Indicated  ?Physical Activity Interventions Patient Refused  ?Stress Interventions Offered [employer], Provide Counseling  ?Social Connections Interventions Intervention Not Indicated  ?Transportation Interventions Intervention Not Indicated  ?Depression Interventions/Treatment  Referral to Psychiatry, Medication, Counseling  ? ?  ?  ? ?Advanced Directives Status: Not ready or willing to  discuss. ? ?CCM Care Plan ? ?No Known Allergies ? ?Outpatient Encounter Medications as of 09/21/2021  ?Medication Sig  ? aspirin 81 MG EC tablet Take 1 tablet (81 mg total) by mouth daily. Swallow whole.  ? CARDIZEM CD 180 MG 24 hr capsule Take 1 capsule by mouth daily.  ? carvedilol (COREG) 6.25 MG tablet Take 6.25 mg by mouth 2 (two) times daily.  ? losartan (COZAAR) 100 MG tablet Take 1 tablet by mouth daily.  ? meloxicam (MOBIC) 15 MG tablet Take 1 tablet (15 mg total) by mouth daily.  ? sildenafil (VIAGRA) 100 MG tablet Take 1 tab 1 hour  prior to incourse  ? tamsulosin (FLOMAX) 0.4 MG CAPS capsule Take 1 capsule by mouth daily.  ? ?No facility-administered encounter medications on file as of 09/21/2021.  ? ? ?Patient Active Problem List  ? Diagnosis Date Noted  ? Anxiety 09/14/2021  ? Primary hypertension 09/14/2021  ? Depression, recurrent (HCC) 09/14/2021  ? Screening for prostate cancer 09/14/2021  ? Weakness 09/14/2021  ? Dizziness 06/15/2021  ? TIA (transient ischemic attack) 06/15/2021  ? Pyogenic granuloma of skin 12/15/2020  ? Cardiomyopathy (HCC) 12/04/2020  ? Dry eye syndrome, bilateral 02/21/2020  ? PCO (posterior capsular opacification), left 02/21/2020  ? Primary open angle glaucoma of both eyes, severe stage 02/21/2020  ? Status post eye surgery 02/21/2020  ? Essential hypertension 05/04/2019  ? Patent foramen ovale 05/04/2019  ? Transient ischemic attack 05/04/2019  ? Wolff-Parkinson-White (WPW) syndrome 05/04/2019  ? Moderate episode of recurrent major depressive disorder (HCC) 01/06/2018  ? Dyspnea on exertion 11/20/2017  ? Episodic lightheadedness 08/12/2016  ? Situational depression 06/07/2016  ? Nuclear sclerotic cataract of both eyes 01/15/2016  ? H. pylori infection 10/19/2014  ? Chest pain at rest 03/16/2014  ? Palpitations 03/16/2014  ? Leg pain,  bilateral 02/25/2014  ? Chronic pain syndrome 01/31/2014  ? Benign prostatic hyperplasia with incomplete bladder emptying 01/14/2014  ? Neck  pain 11/12/2013  ? Hepatitis C, chronic (HCC) 12/30/2012  ? ? ?Conditions to be addressed/monitored: Anxiety, Depression, and Chronic Pain Syndrome.  Mental Health Concerns and Lacks Knowledge of [employer]. ? ?Care Plan : LCSW Plan of Care  ?Updates made by Karolee Stamps, LCSW since 09/24/2021 12:00 AM  ?  ? ?Problem: Reduce and Manage My Symptoms of Anxiety and Depression.   ?Priority: High  ?  ? ?Goal: Reduce and Manage My Symptoms of Anxiety and Depression.   ?Start Date: 09/21/2021  ?Expected End Date: 12/21/2021  ?This Visit's Progress: On track  ?Priority: High  ?Note:   ?Current Barriers:   ?Acute Mental Health needs related to Anxiety, Depression, and Chronic Pain Syndrome, requires Support, Education, Resources, Referrals, Advocacy, and Care Coordination, in order to meet unmet Acute Mental Health needs. ?Patient lacks knowledge of available community agencies and resources offering mental health counseling and supportive services. ?Clinical Goal(s):  ?Patient will work with LCSW, to reduce and manage symptoms of Anxiety, Depression, and Chronic Pain Syndrome, until well-established with a community mental health provider.     ?Patient will increase knowledge and/or ability of:  ?      Coping Skills, Healthy Habits, Self-Management Skills, Stress Reduction, Home Safety, and Utilizing [employer] and Resources.   ?Interventions: ?Collaboration with Primary Care Physician, Dr. Roma Schanz Vigg regarding development and update of comprehensive plan of care, as evidenced by provider attestation and co-signature. ?Inter-disciplinary care team collaboration (see longitudinal plan of care). ?Clinical Interventions:  ?Assessed patient's previous treatment, needs, coping skills, current treatment, support system, and barriers to care. ?PHQ-2 and PHQ-9 Depression Screening Tool performed, and results reviewed with patient. ?Mindfulness Meditation Strategies, Relaxation Techniques and Deep Breathing  Exercises taught, and encouraged daily. ?Solution-Focused Therapy performed. ?Verbalization of Feelings encouraged. ?Emotional Support provided. ?Problem Solving Solutions developed. ?Cognitive Behavioral Therapy initiated. ?Quality of Sleep assessed, and Sleep Hygiene Techniques promoted. ?Increase Level of Activity/Exercise emphasized.    ?Discussed plans with patient for ongoing care management follow-up, and provided patient with direct contact information for care management team. ?Discussed several options for long-term counseling based on need and insurance through [facility]. ?Discussed referral to [facility] for ongoing mental health counseling and supportive services.   ?Advertising account planner of reputable psychiatrists, psychologists, licensed professional counselors, and licensed clinical social workers in [place], accepting [employer]. ?Patient Goals/Self-Care Activities: ?Begin personal counseling with LCSW, on a bi-weekly basis, to reduce and manage symptoms of Anxiety, Depression, and Chronic Pain Syndrome, until well-established with a community mental health provider.   ?Incorporate into daily practice - relaxation techniques, deep breathing exercises and mindfulness meditation strategies. ?Review list of reputable psychiatrists, psychologists, licensed professional counselors, and licensed clinical social workers in [place], accepting [employer], mailed to you on 09/21/2021, and be prepared to discuss during our next scheduled telephone outreach call. ?Contact LCSW directly (# [phone]), if you have questions, need assistance, or if additional social work needs are identified.    ?Follow-Up Date:  09/27/2021 at 10:00 am  ?  ?Danford Bad LCSW ?Licensed Clinical Social Worker ?[facility]  ?[phone]  ?

## 2021-09-24 NOTE — Patient Instructions (Signed)
Visit Information  ? ?Thank you for taking time to visit with me today. Please don't hesitate to contact me if I can be of assistance to you before our next scheduled telephone appointment. ? ?Following are the goals we discussed today:  ?Patient Goals/Self-Care Activities: ?Begin personal counseling with LCSW, on a bi-weekly basis, to reduce and manage symptoms of Anxiety, Depression, and Chronic Pain Syndrome, until well-established with a community mental health provider.   ?Incorporate into daily practice - relaxation techniques, deep breathing exercises and mindfulness meditation strategies. ?Review list of reputable psychiatrists, psychologists, licensed professional counselors, and licensed clinical social workers in [place], accepting [employer], mailed to you on 09/21/2021, and be prepared to discuss during our next scheduled telephone outreach call. ?Contact LCSW directly (# [phone]), if you have questions, need assistance, or if additional social work needs are identified.    ?Follow-Up Date:  09/27/2021 at 10:00 am  ? ?Please call the care guide team at [phone] if you need to cancel or reschedule your appointment.  ? ?If you are experiencing a Mental Health or [facility] or need someone to talk to, please call the Suicide and Crisis Lifeline: 988 ?call the [place] National Suicide Prevention Lifeline: (626)114-3719 or TTY: (323)422-9159 TTY 928-721-2299) to talk to a trained counselor ?call 1-800-273-TALK (toll free, 24 hour hotline) ?go to [facility] [address] [place] [phone]) ?call the [facility]: [phone] ?call 911  ? ?Following is a copy of your full care plan:  ?Care Plan : [place]  ?Updates made by Francis Gaines, LCSW since 09/24/2021 12:00 AM  ?  ? ?Problem: Reduce and Manage My Symptoms of Anxiety and Depression.   ?Priority: High  ?  ? ?Goal: Reduce and Manage My Symptoms  of Anxiety and Depression.   ?Start Date: 09/21/2021  ?Expected End Date: 12/21/2021  ?This Visit's Progress: On track  ?Priority: High  ?Note:   ?Current Barriers:   ?Acute Mental Health needs related to Anxiety, Depression, and Chronic Pain Syndrome, requires Support, Education, Resources, Referrals, Advocacy, and Care Coordination, in order to meet unmet Acute Mental Health needs. ?Patient lacks knowledge of available community agencies and resources offering mental health counseling and supportive services. ?Clinical Goal(s):  ?Patient will work with LCSW, to reduce and manage symptoms of Anxiety, Depression, and Chronic Pain Syndrome, until well-established with a community mental health provider.     ?Patient will increase knowledge and/or ability of:  ?      Coping Skills, Healthy Habits, Self-Management Skills, Stress Reduction, Home Safety, and Utilizing [employer] and Resources.   ?Interventions: ?Collaboration with Primary Care Physician, Dr. Loman Brooklyn Vigg regarding development and update of comprehensive plan of care, as evidenced by provider attestation and co-signature. ?Inter-disciplinary care team collaboration (see longitudinal plan of care). ?Clinical Interventions:  ?Assessed patient's previous treatment, needs, coping skills, current treatment, support system, and barriers to care. ?PHQ-2 and PHQ-9 Depression Screening Tool performed, and results reviewed with patient. ?Mindfulness Meditation Strategies, Relaxation Techniques and Deep Breathing Exercises taught, and encouraged daily. ?Solution-Focused Therapy performed. ?Verbalization of Feelings encouraged. ?Emotional Support provided. ?Problem Solving Solutions developed. ?Cognitive Behavioral Therapy initiated. ?Quality of Sleep assessed, and Sleep Hygiene Techniques promoted. ?Increase Level of Activity/Exercise emphasized.    ?Discussed plans with patient for ongoing care management follow-up, and provided patient with direct contact  information for care management team. ?Discussed several options for long-term counseling based on need and insurance through [facility]. ?Discussed  referral to [facility] for ongoing mental health counseling and supportive services.   ?Estate agent of reputable psychiatrists, psychologists, licensed professional counselors, and licensed clinical social workers in [place], accepting [employer]. ?Patient Goals/Self-Care Activities: ?Begin personal counseling with LCSW, on a bi-weekly basis, to reduce and manage symptoms of Anxiety, Depression, and Chronic Pain Syndrome, until well-established with a community mental health provider.   ?Incorporate into daily practice - relaxation techniques, deep breathing exercises and mindfulness meditation strategies. ?Review list of reputable psychiatrists, psychologists, licensed professional counselors, and licensed clinical social workers in [place], accepting [employer], mailed to you on 09/21/2021, and be prepared to discuss during our next scheduled telephone outreach call. ?Contact LCSW directly (# [phone]), if you have questions, need assistance, or if additional social work needs are identified.    ?Follow-Up Date:  09/27/2021 at 10:00 am  ?  ? ? ?Consent to CCM Services: ?Mr. Gift was given information about Chronic Care Management services including:  ?CCM service includes personalized support from designated clinical staff supervised by his physician, including individualized plan of care and coordination with other care providers ?24/7 contact phone numbers for assistance for urgent and routine care needs. ?Service will only be billed when office clinical staff spend 20 minutes or more in a month to coordinate care. ?Only one practitioner may furnish and bill the service in a calendar month. ?The patient may stop CCM services at any time (effective at the end of the month) by phone call to the office staff. ?The patient will  be responsible for cost sharing (co-pay) of up to 20% of the service fee (after annual deductible is met). ? ?Patient agreed to services and verbal consent obtained.  ? ?Patient verbalizes understanding of instructions and care plan provided today and agrees to view in [place]. Active MyChart status confirmed with patient.   ? ?Telephone follow up appointment with care management team member scheduled for:  09/27/2021 at 10:00 am ? ?Di Kindle Mystie Ormand LCSW ?Licensed Clinical Social Worker ?[place] ?[phone]  ? ? ?  ?

## 2021-09-25 NOTE — Progress Notes (Signed)
Carelink Summary Report / Loop Recorder 

## 2021-09-27 ENCOUNTER — Ambulatory Visit: Payer: Self-pay | Admitting: *Deleted

## 2021-09-27 ENCOUNTER — Telehealth: Payer: Medicare HMO

## 2021-09-28 ENCOUNTER — Telehealth: Payer: Medicare HMO

## 2021-09-28 ENCOUNTER — Telehealth: Payer: Self-pay | Admitting: *Deleted

## 2021-09-28 ENCOUNTER — Telehealth: Payer: Medicare HMO | Admitting: *Deleted

## 2021-09-28 NOTE — Telephone Encounter (Signed)
?  Care Management  ? ?Follow Up Note ? ? ?09/28/2021 ? ?Name: Jesus Price MRN: [id number] DOB: 09/28/1960 ? ?Referred By: Loura Pardon, MD ? ?Reason for Referral: Chronic Care Management Needs in Patient with Anxiety, Depression, and Chronic Pain Syndrome. ? ?An unsuccessful telephone outreach was attempted today. The patient was referred to the case management team for assistance with care management and care coordination. A HIPAA compliant message was left on voicemail, providing contact information, encouraging patient to return LCSW's call at his earliest convenience.  LCSW will make a second follow-up telephone outreach call attempt, within the next 5-7 business days, if a return call is not received from patient in the meantime. ? ?Follow-Up Plan:  10/12/2021 at 9:00 am ? ?Mardene Celeste Ajane Novella LCSW ?Licensed Clinical Social Worker ?[facility] ?[phone]  ?

## 2021-10-03 ENCOUNTER — Encounter: Payer: Self-pay | Admitting: Cardiology

## 2021-10-03 ENCOUNTER — Ambulatory Visit (INDEPENDENT_AMBULATORY_CARE_PROVIDER_SITE_OTHER): Payer: Medicare HMO | Admitting: Cardiology

## 2021-10-03 VITALS — BP 166/90 | HR 58 | Ht 67.0 in | Wt 121.0 lb

## 2021-10-03 DIAGNOSIS — I5022 Chronic systolic (congestive) heart failure: Secondary | ICD-10-CM

## 2021-10-03 DIAGNOSIS — R002 Palpitations: Secondary | ICD-10-CM | POA: Diagnosis not present

## 2021-10-03 DIAGNOSIS — I428 Other cardiomyopathies: Secondary | ICD-10-CM | POA: Diagnosis not present

## 2021-10-03 NOTE — Patient Instructions (Signed)
Medications: ?Your physician recommends that you continue on your current medications as directed. Please refer to the Current Medication list given to you today. ?*If you need a refill on your cardiac medications before your next appointment, please call your pharmacy* ? ?Lab Work: ?None. ?If you have labs (blood work) drawn today and your tests are completely normal, you will receive your results only by: ?MyChart Message (if you have MyChart) OR ?A paper copy in the mail ?If you have any lab test that is abnormal or we need to change your treatment, we will call you to review the results. ? ?Testing/Procedures: ?None. ? ?Follow-Up: ?At [facility], you and your health needs are our priority.  As part of our continuing mission to provide you with exceptional heart care, we have created designated Provider Care Teams.  These Care Teams include your primary Cardiologist (physician) and Advanced Practice Providers (APPs -  Physician Assistants and Nurse Practitioners) who all work together to provide you with the care you need, when you need it. ? ?Your physician wants you to follow-up in: 12 months with Cameron Lambert  ? ? ?  You will receive a reminder letter in the mail two months in advance. If you don't receive a letter, please call our office to schedule the follow-up appointment. ? ? ?We recommend signing up for the patient portal called "MyChart".  Sign up information is provided on this After Visit Summary.  MyChart is used to connect with patients for Virtual Visits (Telemedicine).  Patients are able to view lab/test results, encounter notes, upcoming appointments, etc.  Non-urgent messages can be sent to your provider as well.   ?To learn more about what you can do with MyChart, go to [email or web address].   ? ?Any Other Special Instructions Will Be Listed Below (If Applicable). ? ?

## 2021-10-03 NOTE — Progress Notes (Addendum)
?Electrophysiology Office Follow up Visit Note:   ? ?Date:  10/03/2021  ? ?ID:  Jesus [facility], DOB Apr 11, 1961, MRN [id number] ? ?PCP:  Loura Pardon, MD  ?[facility] Cardiologist:  None  ?[facility] Electrophysiologist:  Lanier Prude, MD  ? ? ?Interval History:   ? ?Jesus Price is a 61 y.o. male who presents for a follow up visit. They were last seen in clinic 06/27/2021. He has a history of SVT. I previously replaced his loop recorder at the 12/28 visit. No arrhythmias have been detected. He tells me that he has been [place]  lot of stress lately. His wife was ill recently with a respiratory infection but is improving. His Bps have been elevated at home.  ?  ? ?Past Medical History:  ?Diagnosis Date  ? Arthritis   ? Glaucoma   ? Hypertension   ? Wolff-Parkinson-White syndrome   ? ? ?Past Surgical History:  ?Procedure Laterality Date  ? EYE SURGERY    ? ? ?Current Medications: ?Current Meds  ?Medication Sig  ? aspirin 81 MG EC tablet Take 1 tablet (81 mg total) by mouth daily. Swallow whole.  ? CARDIZEM CD 180 MG 24 hr capsule Take 1 capsule by mouth daily.  ? carvedilol (COREG) 6.25 MG tablet Take 6.25 mg by mouth 2 (two) times daily.  ? losartan (COZAAR) 100 MG tablet Take 1 tablet by mouth daily.  ? meloxicam (MOBIC) 15 MG tablet Take 1 tablet (15 mg total) by mouth daily.  ? sildenafil (VIAGRA) 100 MG tablet Take 1 tab 1 hour  prior to incourse  ? tamsulosin (FLOMAX) 0.4 MG CAPS capsule Take 1 capsule by mouth daily.  ?  ? ?Allergies:   Patient has no known allergies.  ? ?Social History  ? ?Socioeconomic History  ? Marital status: Married  ?  Spouse name: Parks Czajkowski  ? Number of children: Not on file  ? Years of education: 25  ? Highest education level: 12th grade  ?Occupational History  ? Not on file  ?Tobacco Use  ? Smoking status: Every Day  ?  Packs/day: 0.25  ?  Types: Cigarettes  ?  Passive exposure: Current  ? Smokeless tobacco: Never  ? Tobacco comments:  ?  5 cigarettes per day    ?Vaping Use  ? Vaping Use: Never used  ?Substance and Sexual Activity  ? Alcohol use: Never  ? Drug use: Never  ? Sexual activity: Not Currently  ?Other Topics Concern  ? Not on file  ?Social History Narrative  ? Not on file  ? ?Social Determinants of Health  ? ?Financial Resource Strain: Medium Risk  ? Difficulty of Paying Living Expenses: Somewhat hard  ?Food Insecurity: No Food Insecurity  ? Worried About Programme researcher, broadcasting/film/video in the Last Year: Never true  ? Ran Out of Food in the Last Year: Never true  ?Transportation Needs: No Transportation Needs  ? Lack of Transportation (Medical): No  ? Lack of Transportation (Non-Medical): No  ?Physical Activity: Inactive  ? Days of Exercise per Week: 0 days  ? Minutes of Exercise per Session: 0 min  ?Stress: No Stress Concern Present  ? Feeling of Stress : Only a little  ?Social Connections: Moderately Integrated  ? Frequency of Communication with Friends and Family: Twice a week  ? Frequency of Social Gatherings with Friends and Family: Twice a week  ? Attends Religious Services: 1 to 4 times per year  ? Active Member of Clubs or Organizations:  No  ? Attends Banker Meetings: Never  ? Marital Status: Married  ?  ? ?Family History: ?The patient's family history includes Heart disease in his mother; Lupus in his maternal grandmother and sister. ? ?ROS:   ?Please see the history of present illness.    ?All other systems reviewed and are negative. ? ?EKGs/Labs/Other Studies Reviewed:   ? ?The following studies were reviewed today: ? ? ? ?EKG:  The ekg ordered today demonstrates sinus. No preexcitation. ? ?Recent Labs: ?09/14/2021: ALT 11; BUN 13; Creatinine, Ser 1.17; Hemoglobin 14.2; Platelets 200; Potassium 4.5; Sodium 140; TSH 1.620  ?Recent Lipid Panel ?   ?Component Value Date/Time  ? CHOL 157 09/14/2021 0904  ? TRIG 42 09/14/2021 0904  ? HDL 55 09/14/2021 0904  ? CHOLHDL 2.9 09/14/2021 0904  ? LDLCALC 93 09/14/2021 0904  ? ? ?Physical Exam:   ? ?VS:  BP (!)  166/90 (BP Location: Left Arm, Patient Position: Sitting, Cuff Size: Normal)   Pulse (!) 58   Ht 5\' 7"  (1.702 m)   Wt 121 lb (54.9 kg)   SpO2 98%   BMI 18.95 kg/m?    ? ?Wt Readings from Last 3 Encounters:  ?10/03/21 121 lb (54.9 kg)  ?09/14/21 124 lb 9.6 oz (56.5 kg)  ?07/06/21 128 lb 14.4 oz (58.5 kg)  ?  ?Manual recheck of BP 132/66. ? ?GEN:  Well nourished, well developed in no acute distress ?HEENT: Normal ?NECK: No JVD; No carotid bruits ?LYMPHATICS: No lymphadenopathy ?CARDIAC: RRR, no murmurs, rubs, gallops ?RESPIRATORY:  Clear to auscultation without rales, wheezing or rhonchi  ?ABDOMEN: Soft, non-tender, non-distended ?MUSCULOSKELETAL:  No edema; No deformity  ?SKIN: Warm and dry ?NEUROLOGIC:  Alert and oriented x 3 ?PSYCHIATRIC:  Normal affect  ? ? ? ?  ? ?ASSESSMENT:   ? ?No diagnosis found. ?PLAN:   ? ?In order of problems listed above: ? ? ? ?#Chronic systolic heart failure ?NYHA I-II. Warm and dry on exam. Last EF 45-50% in 12/2020.  ? ?#Palpitations ?No arrhythmias on loop recorder monitoring thus far. Continue current medical therapy. ? ?#HTN ?Recheck manually was OK. I have asked him to check his BP 1-2 times per week and record these values. He should bring them to his next appointment with his primary care physician. ? ?F/u 12 months. Will repeat echo at that visit. ? ? ? ?Medication Adjustments/Labs and Tests Ordered: ?Current medicines are reviewed at length with the patient today.  Concerns regarding medicines are outlined above.  ?No orders of the defined types were placed in this encounter. ? ?No orders of the defined types were placed in this encounter. ? ? ? ?Signed, ?01/2021, MD, [facility], [facility] ?10/03/2021 9:45 PM    ?Electrophysiology ?[facility] ?

## 2021-10-11 DIAGNOSIS — I10 Essential (primary) hypertension: Secondary | ICD-10-CM | POA: Diagnosis not present

## 2021-10-11 DIAGNOSIS — F32A Depression, unspecified: Secondary | ICD-10-CM | POA: Diagnosis not present

## 2021-10-11 DIAGNOSIS — Z8673 Personal history of transient ischemic attack (TIA), and cerebral infarction without residual deficits: Secondary | ICD-10-CM | POA: Diagnosis not present

## 2021-10-11 DIAGNOSIS — Z8249 Family history of ischemic heart disease and other diseases of the circulatory system: Secondary | ICD-10-CM | POA: Diagnosis not present

## 2021-10-11 DIAGNOSIS — I456 Pre-excitation syndrome: Secondary | ICD-10-CM | POA: Diagnosis not present

## 2021-10-11 DIAGNOSIS — J301 Allergic rhinitis due to pollen: Secondary | ICD-10-CM | POA: Diagnosis not present

## 2021-10-11 DIAGNOSIS — N529 Male erectile dysfunction, unspecified: Secondary | ICD-10-CM | POA: Diagnosis not present

## 2021-10-11 DIAGNOSIS — N400 Enlarged prostate without lower urinary tract symptoms: Secondary | ICD-10-CM | POA: Diagnosis not present

## 2021-10-11 DIAGNOSIS — I471 Supraventricular tachycardia: Secondary | ICD-10-CM | POA: Diagnosis not present

## 2021-10-11 DIAGNOSIS — F17210 Nicotine dependence, cigarettes, uncomplicated: Secondary | ICD-10-CM | POA: Diagnosis not present

## 2021-10-11 DIAGNOSIS — M1990 Unspecified osteoarthritis, unspecified site: Secondary | ICD-10-CM | POA: Diagnosis not present

## 2021-10-12 ENCOUNTER — Telehealth: Payer: Medicare HMO | Admitting: *Deleted

## 2021-10-12 ENCOUNTER — Telehealth: Payer: Self-pay | Admitting: *Deleted

## 2021-10-12 NOTE — Telephone Encounter (Signed)
?  Care Management  ? ?Follow Up Note ? ? ?10/12/2021 ? ?Name: Jesus Price MRN: [id number] DOB: 03-24-1961 ? ?Referred By: Loura Pardon, MD ? ?Reason for Referral: Chronic Care Management Needs in Patient with Anxiety, Depression, and Chronic Pain Syndrome. ? ?Third unsuccessful telephone outreach was attempted today. The patient was referred to the case management team for assistance with care management and care coordination. The patient's primary care provider has been notified of our unsuccessful attempts to make or maintain contact with the patient. The care management team is pleased to engage with this patient at any time in the future should he/she be interested in assistance from the care management team. A HIPAA compliant message was left on voicemail, encouraging patient to return LCSW's call, if he is interested in receiving social work services. ? ?No Follow-Up Required. ? ?Danford Bad LCSW ?Licensed Clinical Social Worker ?[facility] ?[phone]  ?

## 2021-10-15 ENCOUNTER — Ambulatory Visit (INDEPENDENT_AMBULATORY_CARE_PROVIDER_SITE_OTHER): Payer: Medicare HMO

## 2021-10-15 DIAGNOSIS — I5022 Chronic systolic (congestive) heart failure: Secondary | ICD-10-CM

## 2021-10-15 DIAGNOSIS — I428 Other cardiomyopathies: Secondary | ICD-10-CM

## 2021-10-17 LAB — CUP PACEART REMOTE DEVICE CHECK
Date Time Interrogation Session: 20230414230913
Implantable Pulse Generator Implant Date: 20221228

## 2021-10-24 ENCOUNTER — Encounter: Payer: Self-pay | Admitting: Cardiology

## 2021-10-24 ENCOUNTER — Encounter: Payer: Medicare HMO | Admitting: Cardiology

## 2021-11-01 NOTE — Progress Notes (Signed)
Carelink Summary Report / Loop Recorder 

## 2021-11-18 LAB — CUP PACEART REMOTE DEVICE CHECK
Date Time Interrogation Session: 20230517230925
Implantable Pulse Generator Implant Date: 20221228

## 2021-11-19 ENCOUNTER — Ambulatory Visit (INDEPENDENT_AMBULATORY_CARE_PROVIDER_SITE_OTHER): Payer: Medicare HMO

## 2021-11-19 DIAGNOSIS — I5022 Chronic systolic (congestive) heart failure: Secondary | ICD-10-CM

## 2021-11-19 DIAGNOSIS — R002 Palpitations: Secondary | ICD-10-CM

## 2021-11-30 DIAGNOSIS — H401111 Primary open-angle glaucoma, right eye, mild stage: Secondary | ICD-10-CM | POA: Diagnosis not present

## 2021-11-30 DIAGNOSIS — Z0100 Encounter for examination of eyes and vision without abnormal findings: Secondary | ICD-10-CM | POA: Diagnosis not present

## 2021-11-30 DIAGNOSIS — H401123 Primary open-angle glaucoma, left eye, severe stage: Secondary | ICD-10-CM | POA: Diagnosis not present

## 2021-11-30 DIAGNOSIS — H2511 Age-related nuclear cataract, right eye: Secondary | ICD-10-CM | POA: Diagnosis not present

## 2021-11-30 DIAGNOSIS — Z961 Presence of intraocular lens: Secondary | ICD-10-CM | POA: Diagnosis not present

## 2021-11-30 DIAGNOSIS — H401113 Primary open-angle glaucoma, right eye, severe stage: Secondary | ICD-10-CM | POA: Diagnosis not present

## 2021-11-30 DIAGNOSIS — H401133 Primary open-angle glaucoma, bilateral, severe stage: Secondary | ICD-10-CM | POA: Diagnosis not present

## 2021-12-04 NOTE — Progress Notes (Signed)
Carelink Summary Report / Loop Recorder 

## 2021-12-05 NOTE — Progress Notes (Deleted)
Psychiatric Initial Adult Assessment   Patient Identification: Jesus Price MRN:  [id number] Date of Evaluation:  12/05/2021 Referral Source: *** Chief Complaint:  No chief complaint on file.  Visit Diagnosis: No diagnosis found.  History of Present Illness:   Jesus Price is a 61 y.o. year old male with a history of TIA, SVT,WPW, chronic systolic heart failure, hypertension, who is referred for anxiety.   Daily routine: Diet:  Exercise: Support: Household:  Marital status: Number of children: Employment:  Education:   Last PCP / ongoing medical evaluation:         Associated Signs/Symptoms: Depression Symptoms:  {DEPRESSION SYMPTOMS:20000} (Hypo) Manic Symptoms:  {BHH MANIC SYMPTOMS:22872} Anxiety Symptoms:  {BHH ANXIETY SYMPTOMS:22873} Psychotic Symptoms:  {BHH PSYCHOTIC SYMPTOMS:22874} PTSD Symptoms: {BHH PTSD SYMPTOMS:22875}  Past Psychiatric History:  Outpatient:  Psychiatry admission:  Previous suicide attempt:  Past trials of medication:  History of violence:    Previous Psychotropic Medications: {YES/NO:21197}  Substance Abuse History in the last 12 months:  {yes no:314532}  Consequences of Substance Abuse: {BHH CONSEQUENCES OF SUBSTANCE ABUSE:22880}  Past Medical History:  Past Medical History:  Diagnosis Date   Arthritis    Glaucoma    Hypertension    Wolff-Parkinson-White syndrome     Past Surgical History:  Procedure Laterality Date   EYE SURGERY      Family Psychiatric History: ***  Family History:  Family History  Problem Relation Age of Onset   Heart disease Mother    Lupus Sister    Lupus Maternal Grandmother     Social History:   Social History   Socioeconomic History   Marital status: Married    Spouse name: Redge Hartt   Number of children: Not on file   Years of education: 12   Highest education level: 12th grade  Occupational History   Not on file  Tobacco Use   Smoking status: Every Day     Packs/day: 0.25    Types: Cigarettes    Passive exposure: Current   Smokeless tobacco: Never   Tobacco comments:    5 cigarettes per day   Vaping Use   Vaping Use: Never used  Substance and Sexual Activity   Alcohol use: Never   Drug use: Never   Sexual activity: Not Currently  Other Topics Concern   Not on file  Social History Narrative   Not on file   Social Determinants of Health   Financial Resource Strain: Medium Risk   Difficulty of Paying Living Expenses: Somewhat hard  Food Insecurity: No Food Insecurity   Worried About Programme researcher, broadcasting/film/video in the Last Year: Never true   Ran Out of Food in the Last Year: Never true  Transportation Needs: No Transportation Needs   Lack of Transportation (Medical): No   Lack of Transportation (Non-Medical): No  Physical Activity: Inactive   Days of Exercise per Week: 0 days   Minutes of Exercise per Session: 0 min  Stress: No Stress Concern Present   Feeling of Stress : Only a little  Social Connections: Moderately Integrated   Frequency of Communication with Friends and Family: Twice a week   Frequency of Social Gatherings with Friends and Family: Twice a week   Attends Religious Services: 1 to 4 times per year   Active Member of [employer] or Organizations: No   Attends Banker Meetings: Never   Marital Status: Married    Additional Social History: ***  Allergies:  No Known Allergies  Metabolic Disorder  Labs: Lab Results  Component Value Date   HGBA1C 5.4 09/14/2021   No results found for: PROLACTIN Lab Results  Component Value Date   CHOL 157 09/14/2021   TRIG 42 09/14/2021   HDL 55 09/14/2021   CHOLHDL 2.9 09/14/2021   LDLCALC 93 09/14/2021   LDLCALC 88 06/08/2021   Lab Results  Component Value Date   TSH 1.620 09/14/2021    Therapeutic Level Labs: No results found for: LITHIUM No results found for: CBMZ No results found for: VALPROATE  Current Medications: Current Outpatient Medications   Medication Sig Dispense Refill   aspirin 81 MG EC tablet Take 1 tablet (81 mg total) by mouth daily. Swallow whole. 30 tablet 12   CARDIZEM CD 180 MG 24 hr capsule Take 1 capsule by mouth daily.     carvedilol (COREG) 6.25 MG tablet Take 6.25 mg by mouth 2 (two) times daily.     losartan (COZAAR) 100 MG tablet Take 1 tablet by mouth daily.     meloxicam (MOBIC) 15 MG tablet Take 1 tablet (15 mg total) by mouth daily. 30 tablet 1   sildenafil (VIAGRA) 100 MG tablet Take 1 tab 1 hour  prior to incourse 30 tablet 3   tamsulosin (FLOMAX) 0.4 MG CAPS capsule Take 1 capsule by mouth daily.     No current facility-administered medications for this visit.    Musculoskeletal: Strength & Muscle Tone: within normal limits Gait & Station: normal Patient leans: N/A  Psychiatric Specialty Exam: Review of Systems  There were no vitals taken for this visit.There is no height or weight on file to calculate BMI.  General Appearance: {Appearance:22683}  Eye Contact:  {BHH EYE CONTACT:22684}  Speech:  Clear and Coherent  Volume:  Normal  Mood:  {BHH MOOD:22306}  Affect:  {Affect (PAA):22687}  Thought Process:  Coherent  Orientation:  Full (Time, Place, and Person)  Thought Content:  Logical  Suicidal Thoughts:  {ST/HT (PAA):22692}  Homicidal Thoughts:  {ST/HT (PAA):22692}  Memory:  Immediate;   Good  Judgement:  {Judgement (PAA):22694}  Insight:  {Insight (PAA):22695}  Psychomotor Activity:  Normal  Concentration:  Concentration: Good and Attention Span: Good  Recall:  Good  Fund of Knowledge:Good  Language: Good  Akathisia:  No  Handed:  Right  AIMS (if indicated):  not done  Assets:  Communication Skills Desire for Improvement  ADL's:  Intact  Cognition: WNL  Sleep:  {BHH GOOD/FAIR/POOR:22877}   Screenings: GAD-7    Flowsheet Row Office Visit from 09/14/2021 in [facility] Office Visit from 05/15/2021 in [facility]  Total GAD-7 Score 13 9      PHQ2-9     Flowsheet Row Patient Outreach Telephone from 09/21/2021 in [facility] Coordination Office Visit from 09/14/2021 in [facility] Clinical Support from 07/18/2021 in [facility] Office Visit from 05/15/2021 in [facility]  PHQ-2 Total Score 4 5 0 5  PHQ-9 Total Score 10 13 -- 14       Assessment and Plan:  Jamarii Banks is a 61 y.o. year old male with a history of , who presents for follow up appointment for below.   Assessment  Plan   The patient demonstrates the following risk factors for suicide: Chronic risk factors for suicide include: {Chronic Risk Factors for WUJWJXB:14782956}. Acute risk factors for suicide include: {Acute Risk Factors for OZHYQMV:78469629}. Protective factors for this patient include: {Protective Factors for Suicide BMWU:13244010}. Considering these factors, the overall suicide  risk at this point appears to be {Desc; low/moderate/high:110033}. Patient {ACTION; IS/IS RSW:54627035} appropriate for outpatient follow up.        Collaboration of Care: {BH OP Collaboration of Care:21014065}  Patient/Guardian was advised Release of Information must be obtained prior to any record release in order to collaborate their care with an outside provider. Patient/Guardian was advised if they have not already done so to contact the registration department to sign all necessary forms in order for [place] to release information regarding their care.   Consent: Patient/Guardian gives verbal consent for treatment and assignment of benefits for services provided during this visit. Patient/Guardian expressed understanding and agreed to proceed.   Neysa Hotter, MD 6/7/20232:08 PM

## 2021-12-10 ENCOUNTER — Ambulatory Visit: Payer: Medicare HMO | Admitting: Psychiatry

## 2021-12-12 ENCOUNTER — Telehealth: Payer: Self-pay | Admitting: Cardiology

## 2021-12-12 NOTE — Telephone Encounter (Signed)
Patient c/o Palpitations:  High priority if patient c/o lightheadedness, shortness of breath, or chest pain  How long have you had palpitations/irregular HR/ Afib? Are you having the symptoms now? 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 SOB  Do you have a history of afib (atrial fibrillation) or irregular heart rhythm? Patient has a pacemaker  Have you checked your BP or HR? (document readings if available):  Today-170/86, HR 80  Are you experiencing any other symptoms? No, just  dizziness  Patient has an appt 6/21

## 2021-12-12 NOTE — Telephone Encounter (Signed)
Spoke with patient, loop recorder updated 12/12/21 no episodes, attempted to get patient earlier appointment than 12/19/21 with gen cards patient stated that he would rather wait until 12/19/21 appointment in [place] with CL   Offered patient apt in [place] tomorrow with Dr. Okey Dupre patient stated he has to have his wife bring him and she works tomorrow patient stated he would stick with Wednesday apt.

## 2021-12-19 ENCOUNTER — Encounter: Payer: Self-pay | Admitting: Cardiology

## 2021-12-19 ENCOUNTER — Ambulatory Visit (INDEPENDENT_AMBULATORY_CARE_PROVIDER_SITE_OTHER): Payer: Medicare HMO | Admitting: Cardiology

## 2021-12-19 VITALS — BP 132/72 | HR 78 | Ht 67.0 in | Wt 119.0 lb

## 2021-12-19 DIAGNOSIS — R002 Palpitations: Secondary | ICD-10-CM

## 2021-12-19 DIAGNOSIS — I10 Essential (primary) hypertension: Secondary | ICD-10-CM

## 2021-12-19 MED ORDER — CARVEDILOL 12.5 MG PO TABS: 12.5000 mg | ORAL_TABLET | Freq: Two times a day (BID) | ORAL | 3 refills | Status: DC

## 2021-12-19 NOTE — Progress Notes (Signed)
Electrophysiology Office Follow up Visit Note:    Date:  12/19/2021   ID:  Jesus Price, DOB [birth year], MRN [id number]  PCP:  Charlynne Cousins, MD  [facility] Cardiologist:  None  [facility] Electrophysiologist:  Vickie Epley, MD    Interval History:    Jesus Price is a 61 y.o. male who presents for a follow up visit. They were last seen in clinic October 03, 2021.  He tells me that he continues to feel intermittent palpitations with strong heartbeats.  No syncope or presyncope.  He checks his blood pressures at home and they are typically elevated greater than [id number] mmHg systolic.       Past Medical History:  Diagnosis Date   Arthritis    Glaucoma    Hypertension    Wolff-Parkinson-White syndrome     Past Surgical History:  Procedure Laterality Date   EYE SURGERY      Current Medications: Current Meds  Medication Sig   aspirin 81 MG EC tablet Take 1 tablet (81 mg total) by mouth daily. Swallow whole.   CARDIZEM CD 180 MG 24 hr capsule Take 1 capsule by mouth daily.   carvedilol (COREG) 6.25 MG tablet Take 6.25 mg by mouth 2 (two) times daily.   losartan (COZAAR) 100 MG tablet Take 1 tablet by mouth daily.   meloxicam (MOBIC) 15 MG tablet Take 1 tablet (15 mg total) by mouth daily.   sildenafil (VIAGRA) 100 MG tablet Take 1 tab 1 hour  prior to incourse   tamsulosin (FLOMAX) 0.4 MG CAPS capsule Take 1 capsule by mouth daily.     Allergies:   Patient has no known allergies.   Social History   Socioeconomic History   Marital status: Married    Spouse name: Jesus [facility]   Number of children: Not on file   Years of education: 12   Highest education [facility]: 12th grade  Occupational History   Not on file  Tobacco Use   Smoking status: Every Day    Packs/day: 0.25    Types: Cigarettes    Passive exposure: Current   Smokeless tobacco: Never   Tobacco comments:    5 cigarettes per day   Vaping Use   Vaping Use: Never used  Substance and  Sexual Activity   Alcohol use: Never   Drug use: Never   Sexual activity: Not Currently  Other Topics Concern   Not on file  Social History Narrative   Not on file   Social Determinants of Health   Financial Resource Strain: Medium Risk (09/24/2021)   Overall Financial Resource Strain (CARDIA)    Difficulty of Paying Living Expenses: Somewhat hard  Food Insecurity: No Food Insecurity (09/24/2021)   Hunger Vital Sign    Worried About Running Out of Food in the Last Year: Never true    Ran Out of Food in the Last Year: Never true  Recent Concern: Food Insecurity - Food Insecurity Present (08/09/2021)   Hunger Vital Sign    Worried About Running Out of Food in the Last Year: Often true    Ran Out of Food in the Last Year: Often true  Transportation Needs: No Transportation Needs (09/24/2021)   PRAPARE - Hydrologist (Medical): No    Lack of Transportation (Non-Medical): No  Physical Activity: Inactive (09/24/2021)   Exercise Vital Sign    Days of Exercise per Week: 0 days    Minutes of Exercise per Session: 0 min  Stress: No Stress Concern Present (09/24/2021)   [employer] of Occupational Health - Occupational Stress Questionnaire    Feeling of Stress : Only a little  Social Connections: Moderately Integrated (09/24/2021)   Social Connection and Isolation Panel [NHANES]    Frequency of Communication with Friends and Family: Twice a week    Frequency of Social Gatherings with Friends and Family: Twice a week    Attends Religious Services: 1 to 4 times per year    Active Member of [employer] or Organizations: No    Attends Banker Meetings: Never    Marital Status: Married  Recent Concern: Social Connections - Moderately Isolated (07/18/2021)   Social Connection and Isolation Panel [NHANES]    Frequency of Communication with Friends and Family: Once a week    Frequency of Social Gatherings with Friends and Family: Once a week    Attends  Religious Services: 1 to 4 times per year    Active Member of [employer] or Organizations: No    Attends Engineer, structural: Never    Marital Status: Married     Family History: The patient's family history includes Heart disease in his mother; Lupus in his maternal grandmother and sister.  ROS:   Please see the history of present illness.    All other systems reviewed and are negative.  EKGs/Labs/Other Studies Reviewed:    The following studies were reviewed today:  Loop recorder interrogations reviewed today and show a low PVC percentage, 1.2%.  No sustained arrhythmias.  No pauses.    Recent Labs: 09/14/2021: ALT 11; BUN 13; Creatinine, Ser 1.17; Hemoglobin 14.2; Platelets 200; Potassium 4.5; Sodium 140; TSH 1.620  Recent Lipid Panel    Component Value Date/Time   CHOL 157 09/14/2021 0904   TRIG 42 09/14/2021 0904   HDL 55 09/14/2021 0904   CHOLHDL 2.9 09/14/2021 0904   LDLCALC 93 09/14/2021 0904    Physical Exam:    VS:  BP 132/72   Pulse 78   Ht 5\' 7"  (1.702 m)   Wt 119 lb (54 kg)   SpO2 98%   BMI 18.64 kg/m     Wt Readings from Last 3 Encounters:  12/19/21 119 lb (54 kg)  10/03/21 121 lb (54.9 kg)  09/14/21 124 lb 9.6 oz (56.5 kg)     GEN:  Well nourished, well developed in no acute distress HEENT: Normal NECK: No JVD; No carotid bruits LYMPHATICS: No lymphadenopathy CARDIAC: RRR, no murmurs, rubs, gallops RESPIRATORY:  Clear to auscultation without rales, wheezing or rhonchi  ABDOMEN: Soft, non-tender, non-distended MUSCULOSKELETAL:  No edema; No deformity  SKIN: Warm and dry NEUROLOGIC:  Alert and oriented x 3 PSYCHIATRIC:  Normal affect        ASSESSMENT:    1. Palpitations   2. Primary hypertension    PLAN:    In order of problems listed above:   #Hypertension Uncontrolled.  I will increase his Coreg to 12.5 mg by mouth twice daily.  I encouraged him to follow a low-sodium diet.  We will give him a handout about the DASH  diet.  #PVCs/palpitations Low burden.  Increase Coreg as above.  Follow-up 1 year or sooner as needed.  APP appointment okay.   Medication Adjustments/Labs and Tests Ordered: Current medicines are reviewed at length with the patient today.  Concerns regarding medicines are outlined above.  No orders of the defined types were placed in this encounter.  No orders of the defined types were placed in  this encounter.    Signed, Steffanie Dunn, MD, [facility] 12/19/2021 11:33 AM    Electrophysiology [facility]

## 2021-12-19 NOTE — Patient Instructions (Addendum)
Medications: Increase Coreg 12.5 mg two times daily Your physician recommends that you continue on your current medications as directed. Please refer to the Current Medication list given to you today. *If you need a refill on your cardiac medications before your next appointment, please call your pharmacy*  Lab Work: None. If you have labs (blood work) drawn today and your tests are completely normal, you will receive your results only by: MyChart Message (if you have MyChart) OR A paper copy in the mail If you have any lab test that is abnormal or we need to change your treatment, we will call you to review the results.  Testing/Procedures: None.  Follow-Up: At [facility], you and your health needs are our priority.  As part of our continuing mission to provide you with exceptional heart care, we have created designated Provider Care Teams.  These Care Teams include your primary Cardiologist (physician) and Advanced Practice Providers (APPs -  Physician Assistants and Nurse Practitioners) who all work together to provide you with the care you need, when you need it.  Your physician wants you to follow-up in: 12 months with one of the following Advanced Practice Providers on your designated Care Team:    Ward Givens, NP Eula Listen PA Cadence Fransico Michael PA    You will receive a reminder letter in the mail two months in advance. If you don't receive a letter, please call our office to schedule the follow-up appointment.  We recommend signing up for the patient portal called "MyChart".  Sign up information is provided on this After Visit Summary.  MyChart is used to connect with patients for Virtual Visits (Telemedicine).  Patients are able to view lab/test results, encounter notes, upcoming appointments, etc.  Non-urgent messages can be sent to your provider as well.   To learn more about what you can do with MyChart, go to [email or web address].    Any Other Special Instructions Will Be  Listed Below (If Applicable).

## 2021-12-24 ENCOUNTER — Ambulatory Visit
Admission: EM | Admit: 2021-12-24 | Discharge: 2021-12-24 | Disposition: A | Discharge status: Home / Self Care | Payer: Medicare HMO | Attending: Emergency Medicine | Admitting: Emergency Medicine

## 2021-12-24 ENCOUNTER — Ambulatory Visit: Payer: Medicare HMO

## 2021-12-24 ENCOUNTER — Ambulatory Visit (INDEPENDENT_AMBULATORY_CARE_PROVIDER_SITE_OTHER): Payer: Medicare HMO

## 2021-12-24 DIAGNOSIS — I428 Other cardiomyopathies: Secondary | ICD-10-CM

## 2021-12-24 DIAGNOSIS — M436 Torticollis: Secondary | ICD-10-CM | POA: Diagnosis not present

## 2021-12-24 MED ORDER — METHOCARBAMOL 500 MG PO TABS: 500.0000 mg | ORAL_TABLET | Freq: Two times a day (BID) | ORAL | 0 refills | Status: DC

## 2021-12-24 MED ORDER — IBUPROFEN 600 MG PO TABS: 600.0000 mg | ORAL_TABLET | Freq: Four times a day (QID) | ORAL | 0 refills | Status: AC | PRN

## 2021-12-24 NOTE — ED Triage Notes (Signed)
Pt presents with complaints of neck pain going into his shoulders x 2 weeks. Reports when he turns his head to the left he feels a pain and cannot turn his head all of the way.

## 2021-12-26 LAB — CUP PACEART REMOTE DEVICE CHECK
Date Time Interrogation Session: 20230619231024
Implantable Pulse Generator Implant Date: 20221228

## 2021-12-27 DIAGNOSIS — M542 Cervicalgia: Secondary | ICD-10-CM | POA: Diagnosis not present

## 2022-01-17 NOTE — Progress Notes (Signed)
Carelink Summary Report / Loop Recorder 

## 2022-01-21 DIAGNOSIS — M542 Cervicalgia: Secondary | ICD-10-CM | POA: Diagnosis not present

## 2022-01-24 LAB — CUP PACEART REMOTE DEVICE CHECK
Date Time Interrogation Session: 20230722230638
Implantable Pulse Generator Implant Date: 20221228

## 2022-01-28 ENCOUNTER — Ambulatory Visit (INDEPENDENT_AMBULATORY_CARE_PROVIDER_SITE_OTHER): Payer: Medicare HMO

## 2022-01-28 DIAGNOSIS — I428 Other cardiomyopathies: Secondary | ICD-10-CM

## 2022-02-01 DIAGNOSIS — M542 Cervicalgia: Secondary | ICD-10-CM | POA: Diagnosis not present

## 2022-02-08 DIAGNOSIS — M542 Cervicalgia: Secondary | ICD-10-CM | POA: Diagnosis not present

## 2022-02-15 DIAGNOSIS — M542 Cervicalgia: Secondary | ICD-10-CM | POA: Diagnosis not present

## 2022-02-22 DIAGNOSIS — M542 Cervicalgia: Secondary | ICD-10-CM | POA: Diagnosis not present

## 2022-03-03 NOTE — Progress Notes (Signed)
Carelink Summary Report / Loop Recorder 

## 2022-03-04 LAB — CUP PACEART REMOTE DEVICE CHECK
Date Time Interrogation Session: 20230830231140
Implantable Pulse Generator Implant Date: 20221228

## 2022-03-05 ENCOUNTER — Ambulatory Visit (INDEPENDENT_AMBULATORY_CARE_PROVIDER_SITE_OTHER): Payer: Medicare HMO

## 2022-03-05 DIAGNOSIS — I428 Other cardiomyopathies: Secondary | ICD-10-CM | POA: Diagnosis not present

## 2022-03-22 ENCOUNTER — Ambulatory Visit (INDEPENDENT_AMBULATORY_CARE_PROVIDER_SITE_OTHER): Payer: Medicare HMO | Admitting: Physician Assistant

## 2022-03-22 ENCOUNTER — Encounter: Payer: Self-pay | Admitting: Physician Assistant

## 2022-03-22 ENCOUNTER — Ambulatory Visit: Payer: Medicare HMO | Admitting: Internal Medicine

## 2022-03-22 VITALS — BP 130/78 | HR 59 | Temp 97.8°F | Ht 67.01 in | Wt 119.7 lb

## 2022-03-22 DIAGNOSIS — R002 Palpitations: Secondary | ICD-10-CM | POA: Diagnosis not present

## 2022-03-22 DIAGNOSIS — I10 Essential (primary) hypertension: Secondary | ICD-10-CM

## 2022-03-22 MED ORDER — CARDIZEM CD 180 MG PO CP24: 180.0000 mg | ORAL_CAPSULE | Freq: Every day | ORAL | 1 refills | Status: DC

## 2022-03-22 MED ORDER — CARVEDILOL 12.5 MG PO TABS: 12.5000 mg | ORAL_TABLET | Freq: Two times a day (BID) | ORAL | 1 refills | Status: DC

## 2022-03-22 MED ORDER — LOSARTAN POTASSIUM 100 MG PO TABS: 100.0000 mg | ORAL_TABLET | Freq: Every day | ORAL | 1 refills | Status: DC

## 2022-03-22 NOTE — Assessment & Plan Note (Addendum)
Chronic, historic condition Appears overall well controlled on Losartan 100 mg PO QD, and Carvedilol 12.5 mg PO BID - appears to be tolerating well He sees Cardiology regularly for palpitations  Recommend he continue regular follow up with Cardiology for monitoring  Continue current medications  Follow up in 6 months for monitoring unless concerns arise

## 2022-03-22 NOTE — Patient Instructions (Addendum)
Please continue taking your heart medications and follow up regularly with your Cardiology provider

## 2022-03-22 NOTE — Progress Notes (Signed)
Established Patient Office Visit  Name: Jesus Price   MRN: [id number]    DOB: Oct 04, 1960   Date:03/22/2022  Today's Provider: Talitha Givens, MHS, PA-C Introduced myself to the patient as a PA-C and provided education on APPs in clinical practice.         Subjective  Chief Complaint  Chief Complaint  Patient presents with   Chronic medical problems    Here for follow up    HPI  Hypertension: - Medications: Losartan 100 mg PO QD, Coreg 12.5 mg PO BID, Cardezim 180 mg PO QD - Compliance: Excellent  - Checking BP at home: checking at home every 2 days. Usually runs in the 140s/80s  - Denies any SOB, CP, vision changes, LE edema, medication SEs, or symptoms of hypotension - Diet: He has been encouraged to follow DASH diet  - Exercise:  He sees Cardiology regularly  He has an implanted heart monitor   Reviewed cholesterol results from March 2023 with him  Reports some palpitations but these have gotten better with Cardiology follow up and medications   Patient Active Problem List   Diagnosis Date Noted   Anxiety 09/14/2021   Depression, recurrent ([place]) 09/14/2021   Screening for prostate cancer 09/14/2021   Weakness 09/14/2021   Dizziness 06/15/2021   TIA (transient ischemic attack) 06/15/2021   Pyogenic granuloma of skin 12/15/2020   Cardiomyopathy ([place]) 12/04/2020   Dry eye syndrome, bilateral 02/21/2020   PCO (posterior capsular opacification), left 02/21/2020   Primary open angle glaucoma of both eyes, severe stage 02/21/2020   Status post eye surgery 02/21/2020   Essential hypertension 05/04/2019   Patent foramen ovale 05/04/2019   Transient ischemic attack 05/04/2019   Wolff-Parkinson-White (WPW) syndrome 05/04/2019   Moderate episode of recurrent major depressive disorder ([place]) 01/06/2018   Dyspnea on exertion 11/20/2017   Episodic lightheadedness 08/12/2016   Nuclear sclerotic cataract of both eyes 01/15/2016   H. pylori infection 10/19/2014    Chest pain at rest 03/16/2014   Palpitations 03/16/2014   Leg pain, bilateral 02/25/2014   Chronic pain syndrome 01/31/2014   Benign prostatic hyperplasia with incomplete bladder emptying 01/14/2014   Neck pain 11/12/2013   Hepatitis C, chronic ([place]) 12/30/2012    Past Surgical History:  Procedure Laterality Date   EYE SURGERY      Family History  Problem Relation Age of Onset   Heart disease Mother    Lupus Sister    Lupus Maternal Grandmother     Social History   Tobacco Use   Smoking status: Every Day    Packs/day: 0.25    Types: Cigarettes    Passive exposure: Current   Smokeless tobacco: Never   Tobacco comments:    5 cigarettes per day   Substance Use Topics   Alcohol use: Never     Current Outpatient Medications:    aspirin 81 MG EC tablet, Take 1 tablet (81 mg total) by mouth daily. Swallow whole., Disp: 30 tablet, Rfl: 12   ibuprofen (ADVIL) 600 MG tablet, Take 1 tablet (600 mg total) by mouth every 6 (six) hours as needed., Disp: 30 tablet, Rfl: 0   meloxicam (MOBIC) 15 MG tablet, Take 1 tablet (15 mg total) by mouth daily., Disp: 30 tablet, Rfl: 1   methocarbamol (ROBAXIN) 500 MG tablet, Take 1 tablet (500 mg total) by mouth 2 (two) times daily., Disp: 20 tablet, Rfl: 0   sildenafil (VIAGRA) 100 MG tablet, Take 1 tab  1 hour  prior to incourse, Disp: 30 tablet, Rfl: 3   CARDIZEM CD 180 MG 24 hr capsule, Take 1 capsule (180 mg total) by mouth daily., Disp: 90 capsule, Rfl: 1   carvedilol (COREG) 12.5 MG tablet, Take 1 tablet (12.5 mg total) by mouth 2 (two) times daily., Disp: 180 tablet, Rfl: 1   losartan (COZAAR) 100 MG tablet, Take 1 tablet (100 mg total) by mouth daily., Disp: 90 tablet, Rfl: 1  No Known Allergies  I personally reviewed active problem list, medication list, allergies, health maintenance, notes from last encounter, notes from last 3 encounters, lab results with the patient/caregiver today.   Review of Systems  Eyes:  Negative for  blurred vision and double vision.  Cardiovascular:  Positive for palpitations (intermittent). Negative for chest pain and leg swelling.  Neurological:  Negative for dizziness and headaches.      Objective  Vitals:   03/22/22 1022  BP: 130/78  Pulse: (!) 59  Temp: 97.8 F (36.6 C)  TempSrc: Oral  SpO2: 97%  Weight: 119 lb 11.2 oz (54.3 kg)  Height: 5' 7.01" (1.702 m)    Body mass index is 18.74 kg/m.  Physical Exam Vitals reviewed.  Constitutional:      General: He is awake.     Appearance: Normal appearance. He is well-developed, well-groomed and normal weight.  HENT:     Head: Normocephalic and atraumatic.  Cardiovascular:     Rate and Rhythm: Normal rate and regular rhythm.     Pulses: Normal pulses.          Radial pulses are 2+ on the right side and 2+ on the left side.     Heart sounds: Normal heart sounds. No murmur heard.    No friction rub. No gallop.  Pulmonary:     Effort: Pulmonary effort is normal.     Breath sounds: Normal breath sounds. No decreased air movement. No decreased breath sounds, wheezing, rhonchi or rales.  Musculoskeletal:     Right lower leg: No edema.     Left lower leg: No edema.  Neurological:     General: No focal deficit present.     Mental Status: He is alert and oriented to person, place, and time.     GCS: GCS eye subscore is 4. GCS verbal subscore is 5. GCS motor subscore is 6.     Cranial Nerves: No dysarthria or facial asymmetry.  Psychiatric:        Attention and Perception: Attention and perception normal.        Mood and Affect: Mood and affect normal.        Speech: Speech normal.        Behavior: Behavior normal. Behavior is cooperative.      Recent Results (from the past 2160 hour(s))  CUP PACEART REMOTE DEVICE CHECK     Status: None   Collection Time: 01/19/22 11:06 PM  Result Value Ref Range   Date Time Interrogation Session [id number]    Pulse Generator Manufacturer MERM    Pulse Gen Model LNQ22 Donato Heinz     Pulse Gen Serial Number [id number]    [facility] Name [facility]    Implantable Pulse Generator Type ICM/ILR    Implantable Pulse Generator Implant Date [id number]   CUP PACEART REMOTE DEVICE CHECK     Status: None   Collection Time: 02/27/22 11:11 PM  Result Value Ref Range   Date Time Interrogation Session [id number]    Pulse Generator Manufacturer MERM  Pulse Gen Model [id number] II    Pulse Gen Serial Number [id number]    Clinic Name [facility]    Implantable Pulse Generator Type ICM/ILR    Implantable Pulse Generator Implant Date [id number]    Eval Rhythm SB at 50 bpm      PHQ2/9:    03/22/2022   10:45 AM 09/24/2021    5:43 PM 09/14/2021    9:06 AM 07/18/2021    9:29 AM 05/15/2021    9:10 AM  Depression screen PHQ 2/9  Decreased Interest 1 2 3  0 3  Down, Depressed, Hopeless 1 2 2  0 2  PHQ - 2 Score 2 4 5  0 5  Altered sleeping 1 2 2  1   Tired, decreased energy 2 2 3  3   Change in appetite 2 2 3  3   Feeling bad or failure about yourself  0 0 0  1  Trouble concentrating 0 0 0  0  Moving slowly or fidgety/restless 1 0 0  1  Suicidal thoughts 0 0 0  0  PHQ-9 Score 8 10 13  14   Difficult doing work/chores  Somewhat difficult Somewhat difficult        Fall Risk:    03/22/2022   10:45 AM 09/24/2021    5:48 PM 09/14/2021    9:06 AM 07/18/2021    9:06 AM 05/15/2021    9:10 AM  Fall Risk   Falls in the past year? 0 1 1 0 0  Number falls in past yr: 0 1 1 0 0  Injury with Fall? 0 0 0 0 0  Risk for fall due to : No Fall Risks History of fall(s);Impaired balance/gait;Impaired mobility Impaired balance/gait  No Fall Risks  Follow up  Falls evaluation completed;Education provided;Falls prevention discussed Falls evaluation completed Falls evaluation completed;Falls prevention discussed Falls evaluation completed      Functional Status Survey:      Assessment & Plan   Problem List Items Addressed This Visit       Cardiovascular and Mediastinum   Essential  hypertension - Primary    Chronic, historic condition Appears overall well controlled on Losartan 100 mg PO QD, and Carvedilol 12.5 mg PO BID - appears to be tolerating well He sees Cardiology regularly for palpitations  Recommend he continue regular follow up with Cardiology for monitoring  Continue current medications  Follow up in 6 months for monitoring unless concerns arise       Relevant Medications   CARDIZEM CD 180 MG 24 hr capsule   carvedilol (COREG) 12.5 MG tablet   losartan (COZAAR) 100 MG tablet     Other   Palpitations    Chronic, recurrent  He is taking Cardizem 180 mg PO QD and appears to be tolerating well Recommend he continue his follow up with Cardiology for monitoring  Continue current medications Follow up in 6 months for monitoring and refills unless concerns arise       Relevant Medications   CARDIZEM CD 180 MG 24 hr capsule     Return in about 6 months (around 09/20/2022) for HTN, .   I, Chaise Mahabir E Doranne Schmutz, PA-C, have reviewed all documentation for this visit. The documentation on 03/22/22 for the exam, diagnosis, procedures, and orders are all accurate and complete.   03/24/2022, MHS, PA-C [facility]

## 2022-03-22 NOTE — Assessment & Plan Note (Signed)
Chronic, recurrent  He is taking Cardizem 180 mg PO QD and appears to be tolerating well Recommend he continue his follow up with Cardiology for monitoring  Continue current medications Follow up in 6 months for monitoring and refills unless concerns arise

## 2022-03-29 NOTE — Progress Notes (Signed)
Carelink Summary Report / Loop Recorder 

## 2022-04-01 DIAGNOSIS — H401113 Primary open-angle glaucoma, right eye, severe stage: Secondary | ICD-10-CM | POA: Diagnosis not present

## 2022-04-08 ENCOUNTER — Ambulatory Visit (INDEPENDENT_AMBULATORY_CARE_PROVIDER_SITE_OTHER): Payer: Medicare HMO

## 2022-04-08 DIAGNOSIS — R002 Palpitations: Secondary | ICD-10-CM

## 2022-04-08 LAB — CUP PACEART REMOTE DEVICE CHECK
Date Time Interrogation Session: 20231002231400
Implantable Pulse Generator Implant Date: 20221228

## 2022-04-17 NOTE — Progress Notes (Signed)
Carelink Summary Report / Loop Recorder 

## 2022-05-09 LAB — CUP PACEART REMOTE DEVICE CHECK
Date Time Interrogation Session: 20231104230917
Implantable Pulse Generator Implant Date: 20221228

## 2022-05-13 ENCOUNTER — Ambulatory Visit (INDEPENDENT_AMBULATORY_CARE_PROVIDER_SITE_OTHER): Payer: Medicare HMO

## 2022-05-13 DIAGNOSIS — R002 Palpitations: Secondary | ICD-10-CM | POA: Diagnosis not present

## 2022-05-28 ENCOUNTER — Encounter: Payer: Self-pay | Admitting: Cardiology

## 2022-06-05 ENCOUNTER — Encounter: Payer: Self-pay | Admitting: Physician Assistant

## 2022-06-05 NOTE — Telephone Encounter (Signed)
Pt scheduled  

## 2022-06-07 DIAGNOSIS — H401111 Primary open-angle glaucoma, right eye, mild stage: Secondary | ICD-10-CM | POA: Diagnosis not present

## 2022-06-12 IMAGING — MR MR HEAD W/O CM
12 series · 48 of 48 positions shown · non-contrast
Comparison: None.

CLINICAL DATA: 60-year-old male feels off balance, leaning to the
right. Symptoms for a few months.

EXAM:
MRI HEAD WITHOUT CONTRAST
TECHNIQUE: Multiplanar, multiecho pulse sequences of the brain and surrounding
structures were obtained without intravenous contrast.

[Series 5: ax dwi_tracew · axial · 3.0mm · 0.71mm/px · z∈[-74,+89]mm · 4 of 56 slices shown]
[im 1/56]
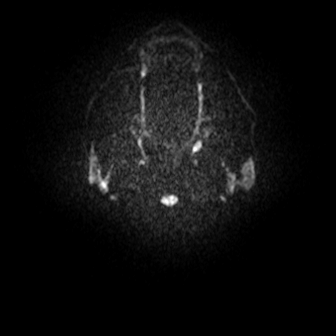
[im 19/56]
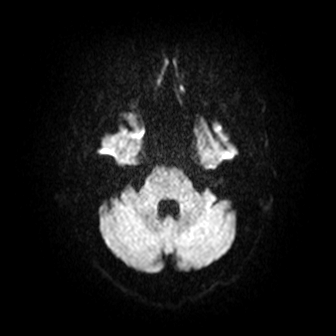
[im 37/56]
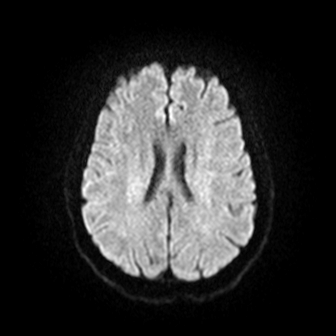
[im 56/56]
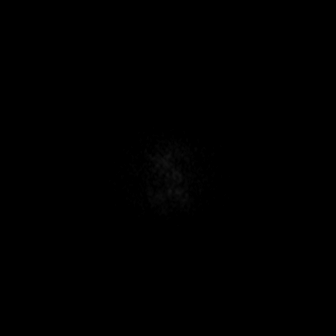

[Series 6: ax dwi_adc · axial · 3.0mm · 0.71mm/px · z∈[-74,+89]mm · 4 of 56 slices shown]
[im 1/56]
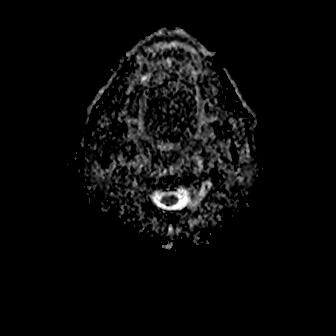
[im 19/56]
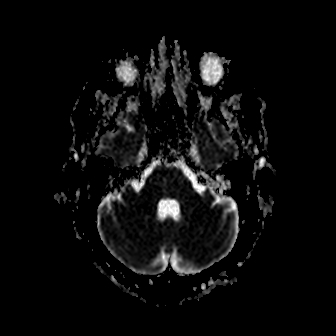
[im 37/56]
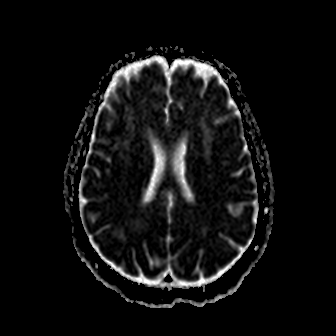
[im 56/56]
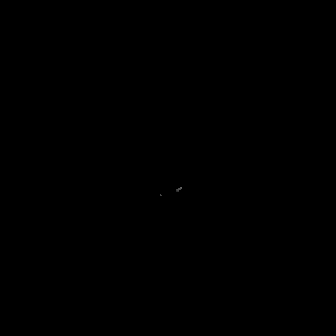

[Series 7: cor dwi_tracew · coronal · 5.0mm · 0.68mm/px · 3 of 40 slices shown]
[im 1/40]
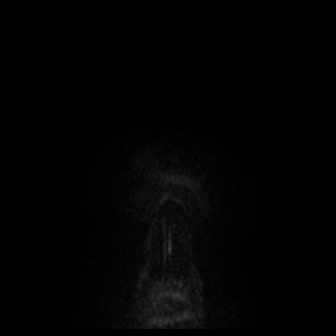
[im 20/40]
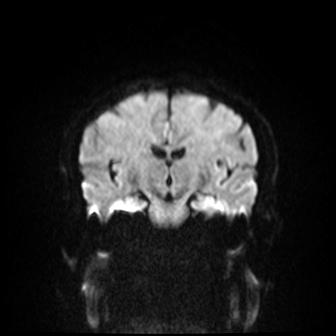
[im 40/40]
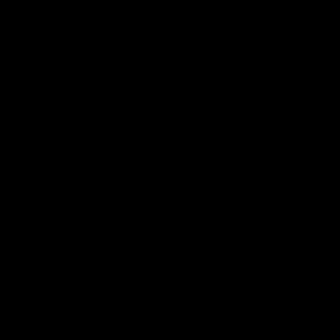

[Series 8: cor dwi_adc · coronal · 5.0mm · 0.68mm/px · 3 of 39 slices shown]
[im 1/39]
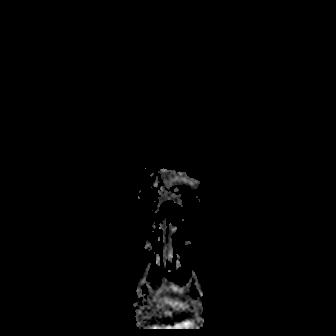
[im 20/39]
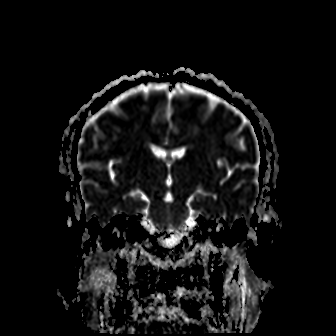
[im 39/39]
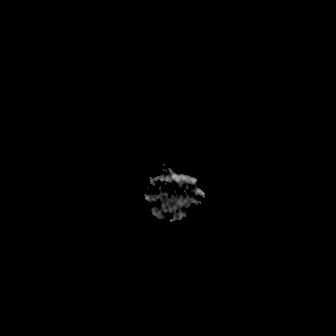

[Series 9: T1 · sagittal · 5.0mm · 0.47mm/px · 1 of 20 slices shown (1 of 2)]
[im 1/20]
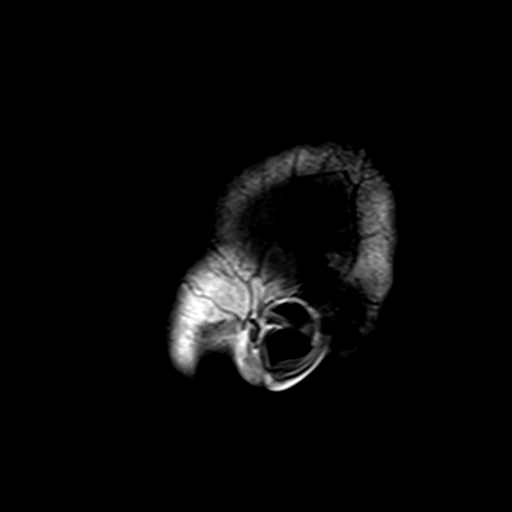

[Series 10: T2 · axial · 5.0mm · 0.86mm/px · z∈[-65,+88]mm · 2 of 27 slices shown (1 of 2)]
[im 1/27]
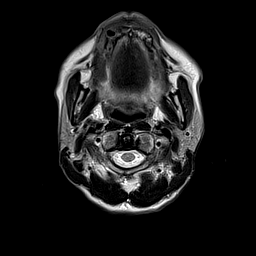
[im 27/27]
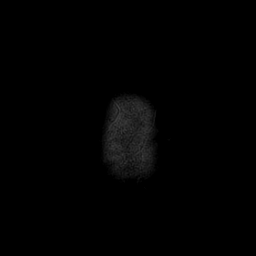

[Series 11: mag_images · axial · 3.0mm · 0.90mm/px · z∈[-63,+87]mm · 4 of 52 slices shown]
[im 1/52]
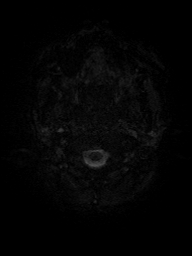
[im 18/52]
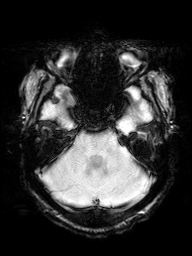
[im 35/52]
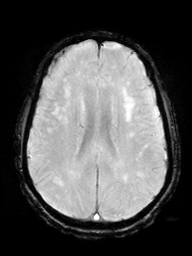
[im 52/52]
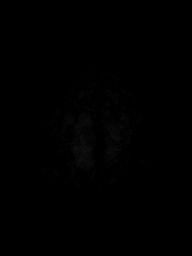

[Series 12: pha_images · axial · 3.0mm · 0.90mm/px · z∈[-63,+87]mm · 4 of 52 slices shown]
[im 1/52]
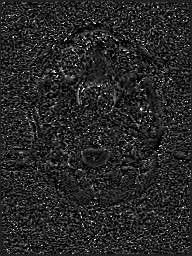
[im 18/52]
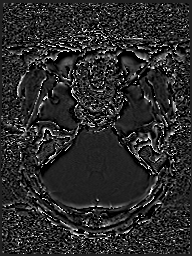
[im 35/52]
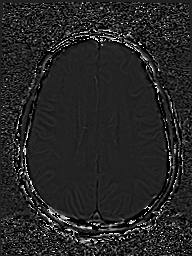
[im 52/52]
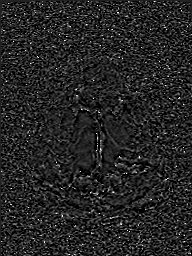

[Series 13: swi_images · axial · 3.0mm · 0.90mm/px · z∈[-63,+87]mm · 4 of 52 slices shown]
[im 1/52]
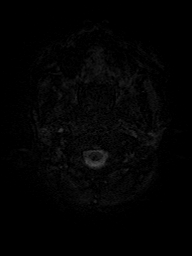
[im 18/52]
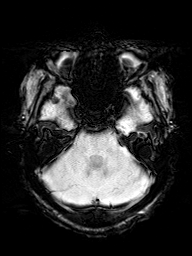
[im 35/52]
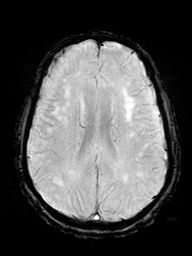
[im 52/52]
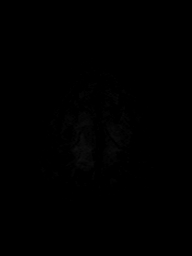

[Series 15: FLAIR · axial · 3.0mm · 0.69mm/px · z∈[-67,+93]mm · 4 of 55 slices shown]
[im 1/55]
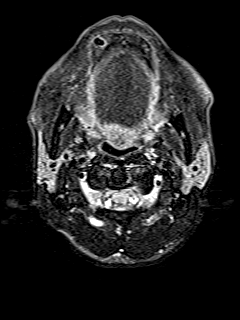
[im 19/55]
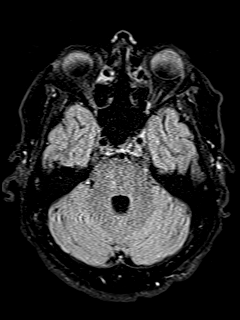
[im 37/55]
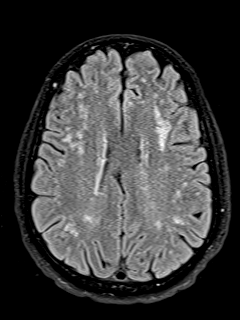
[im 55/55]
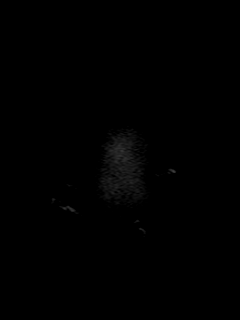

[Series 16: T1 · axial · 1.0mm · 0.98mm/px · z∈[-67,+106]mm · 13 of 174 slices shown (2 of 2)]
[im 1/174]
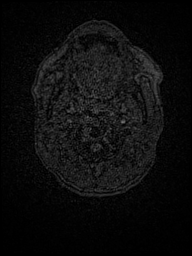
[im 15/174]
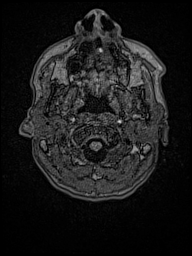
[im 29/174]
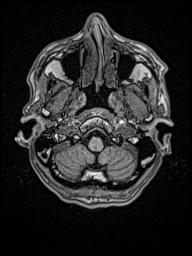
[im 44/174]
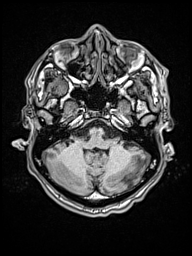
[im 58/174]
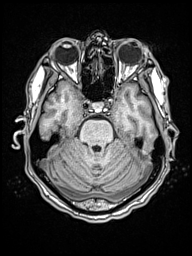
[im 73/174]
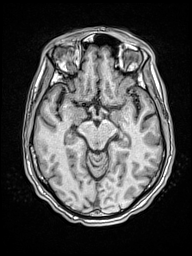
[im 87/174]
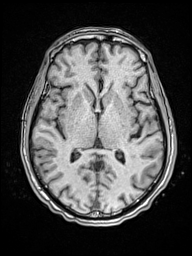
[im 101/174]
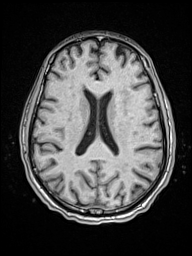
[im 116/174]
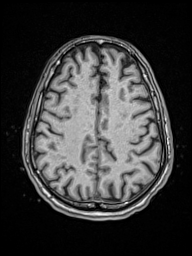
[im 130/174]
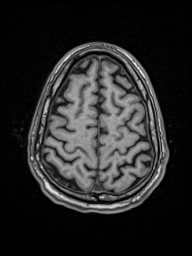
[im 145/174]
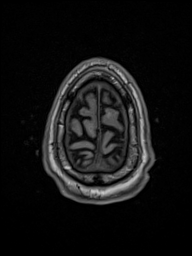
[im 159/174]
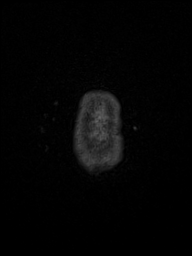
[im 174/174]
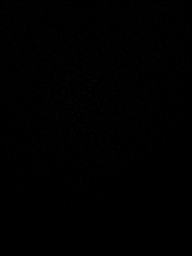

[Series 17: T2 · coronal · 5.0mm · 0.86mm/px · 2 of 30 slices shown (2 of 2)]
[im 1/30]
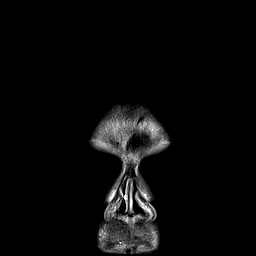
[im 30/30]
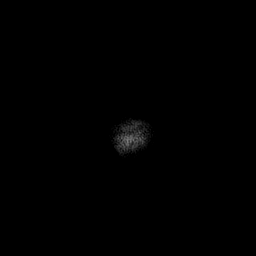

[48 of 48 positions shown; findings below may reference images not displayed]

FINDINGS: Brain: No restricted diffusion to suggest acute infarction. No
midline shift, mass effect, evidence of mass lesion,
ventriculomegaly, extra-axial collection or acute intracranial
hemorrhage. Cervicomedullary junction and pituitary are within
normal limits.

Patchy and widely scattered mostly subcortical white matter T2 and
FLAIR hyperintensity in both cerebral hemispheres. No cortical
encephalomalacia identified. Deep gray matter nuclei and brainstem
are within normal limits. There is a small chronic microhemorrhage
in the left deep cerebellar nucleus (series 13, image 13).
Cerebellum otherwise appears negative. No other chronic cerebral
blood products identified.

Vascular: Major intracranial vascular flow voids are preserved.

Skull and upper cervical spine: Negative visible cervical spine.
Visualized bone marrow signal is within normal limits.

Sinuses/Orbits: Postoperative changes to the left globe, otherwise
negative orbits. Paranasal sinuses and mastoids are well aerated.

Other: Grossly normal visible internal auditory structures. Normal
stylomastoid foramina. Negative visible scalp and face.
IMPRESSION: 1. No acute intracranial abnormality.
2. Moderately advanced but nonspecific cerebral white matter signal
changes. Most commonly this is due to chronic small vessel disease,
and there is a solitary chronic micro-hemorrhage in the left
cerebellum.

## 2022-06-17 ENCOUNTER — Ambulatory Visit (INDEPENDENT_AMBULATORY_CARE_PROVIDER_SITE_OTHER): Payer: Medicare HMO

## 2022-06-17 DIAGNOSIS — R002 Palpitations: Secondary | ICD-10-CM

## 2022-06-18 LAB — CUP PACEART REMOTE DEVICE CHECK
Date Time Interrogation Session: 20231217230441
Implantable Pulse Generator Implant Date: 20221228

## 2022-06-27 NOTE — Progress Notes (Signed)
Carelink Summary Report / Loop Recorder 

## 2022-07-02 DIAGNOSIS — H401133 Primary open-angle glaucoma, bilateral, severe stage: Secondary | ICD-10-CM | POA: Diagnosis not present

## 2022-07-02 DIAGNOSIS — H401123 Primary open-angle glaucoma, left eye, severe stage: Secondary | ICD-10-CM | POA: Diagnosis not present

## 2022-07-02 DIAGNOSIS — H401113 Primary open-angle glaucoma, right eye, severe stage: Secondary | ICD-10-CM | POA: Diagnosis not present

## 2022-07-05 DIAGNOSIS — H401123 Primary open-angle glaucoma, left eye, severe stage: Secondary | ICD-10-CM | POA: Diagnosis not present

## 2022-07-08 ENCOUNTER — Ambulatory Visit: Payer: Medicare HMO | Admitting: Urology

## 2022-07-08 ENCOUNTER — Encounter: Payer: Self-pay | Admitting: Urology

## 2022-07-22 ENCOUNTER — Ambulatory Visit: Payer: Medicare HMO | Attending: Cardiology

## 2022-07-22 DIAGNOSIS — R002 Palpitations: Secondary | ICD-10-CM | POA: Diagnosis not present

## 2022-07-24 LAB — CUP PACEART REMOTE DEVICE CHECK
Date Time Interrogation Session: 20240119230413
Implantable Pulse Generator Implant Date: 20221228

## 2022-07-26 NOTE — Progress Notes (Signed)
[facility] Summary Report / Loop Recorder

## 2022-08-19 ENCOUNTER — Telehealth: Payer: Self-pay | Admitting: Nurse Practitioner

## 2022-08-19 NOTE — Telephone Encounter (Signed)
Contacted Illa Level to schedule their annual wellness visit. Appointment made for 09/03/2022.  [facility] Direct Dial: [phone]

## 2022-08-21 ENCOUNTER — Ambulatory Visit (INDEPENDENT_AMBULATORY_CARE_PROVIDER_SITE_OTHER): Payer: Medicare HMO | Admitting: Urology

## 2022-08-21 ENCOUNTER — Encounter: Payer: Self-pay | Admitting: Urology

## 2022-08-21 VITALS — BP 119/76 | HR 64 | Ht 67.0 in | Wt 117.0 lb

## 2022-08-21 DIAGNOSIS — R102 Pelvic and perineal pain: Secondary | ICD-10-CM | POA: Diagnosis not present

## 2022-08-21 DIAGNOSIS — R3129 Other microscopic hematuria: Secondary | ICD-10-CM | POA: Diagnosis not present

## 2022-08-21 DIAGNOSIS — N50819 Testicular pain, unspecified: Secondary | ICD-10-CM | POA: Diagnosis not present

## 2022-08-21 LAB — URINALYSIS, COMPLETE
Bilirubin, UA: NEGATIVE
Glucose, UA: NEGATIVE
Ketones, UA: NEGATIVE
Leukocytes,UA: NEGATIVE
Nitrite, UA: NEGATIVE
Protein,UA: NEGATIVE
Specific Gravity, UA: 1.015 (ref 1.005–1.030)
Urobilinogen, Ur: 1 mg/dL (ref 0.2–1.0)
pH, UA: 6.5 (ref 5.0–7.5)

## 2022-08-21 LAB — MICROSCOPIC EXAMINATION

## 2022-08-21 MED ORDER — TAMSULOSIN HCL 0.4 MG PO CAPS: 0.4000 mg | ORAL_CAPSULE | Freq: Every day | ORAL | 1 refills | Status: DC

## 2022-08-21 NOTE — Progress Notes (Signed)
08/21/2022 9:25 PM   Jesus Price Feb 11, 1961 [id number]  Referring provider: Venita Lick, NP [address] [place],  [place] [address]  Chief Complaint  Patient presents with   Follow-up    HPI: 62 y.o. male for appointment for evaluation of perineal pain.  He has previously been seen for erectile dysfunction  1 month history perineal pain Described as a dull ache however will intermittently be spasmodic which typically occurs when walking Has noted a weak urinary stream Denies gross hematuria    PMH: Past Medical History:  Diagnosis Date   Arthritis    Glaucoma    Hypertension    Situational depression 06/07/2016   Wolff-Parkinson-White syndrome     Surgical History: Past Surgical History:  Procedure Laterality Date   EYE SURGERY      Home Medications:  Allergies as of 08/21/2022   No Known Allergies      Medication List        Accurate as of August 21, 2022  9:25 PM. If you have any questions, ask your nurse or doctor.          aspirin EC 81 MG tablet Take 1 tablet (81 mg total) by mouth daily. Swallow whole.   Cardizem CD 180 MG 24 hr capsule Generic drug: diltiazem Take 1 capsule (180 mg total) by mouth daily.   carvedilol 12.5 MG tablet Commonly known as: COREG Take 1 tablet (12.5 mg total) by mouth 2 (two) times daily.   ibuprofen 600 MG tablet Commonly known as: ADVIL Take 1 tablet (600 mg total) by mouth every 6 (six) hours as needed.   losartan 100 MG tablet Commonly known as: COZAAR Take 1 tablet (100 mg total) by mouth daily.   meloxicam 15 MG tablet Commonly known as: MOBIC Take 1 tablet (15 mg total) by mouth daily.   methocarbamol 500 MG tablet Commonly known as: ROBAXIN Take 1 tablet (500 mg total) by mouth 2 (two) times daily.   sildenafil 100 MG tablet Commonly known as: Viagra Take 1 tab 1 hour  prior to incourse   tamsulosin 0.4 MG Caps capsule Commonly known as: FLOMAX Take 1 capsule (0.4 mg total) by  mouth daily.        Allergies: No Known Allergies  Family History: Family History  Problem Relation Age of Onset   Heart disease Mother    Lupus Sister    Lupus Maternal Grandmother     Social History:  reports that he has been smoking cigarettes. He has been smoking an average of .25 packs per day. He has been exposed to tobacco smoke. He has never used smokeless tobacco. He reports that he does not drink alcohol and does not use drugs.   Physical Exam: BP 119/76   Pulse 64   Ht 5' 7"$  (1.702 m)   Wt 117 lb (53.1 kg)   BMI 18.32 kg/m   Constitutional:  Alert and oriented, No acute distress. HEENT: [place] AT Respiratory: Normal respiratory effort, no increased work of breathing. GI: Abdomen is soft, nontender, nondistended, no abdominal masses GU: Phallus without lesions.  Testes descended bilaterally without masses or tenderness.  Meatus normal in appearance.  No tenderness, mass, fluctuance perineum prostate 30 g with mild tenderness.  Marked pelvic floor tenderness Skin: No rashes, bruises or suspicious lesions. Neurologic: Grossly intact, no focal deficits, moving all 4 extremities. Psychiatric: Normal mood and affect.  Laboratory Data:  Urinalysis Dipstick trace blood Microscopy 3-10 RBC   Assessment & Plan:  1.  Perineal pain He does have mild prostatic and marked pelvic floor tenderness Trial tamsulosin 0.4 mg daily  2.  Microhematuria UA results returned after patient had left the office.  He does have 3-10 RBC.  He will be contacted with recommendations of scheduling a CT urogram.  If no obvious abnormality on CTU will schedule cystoscopy   Abbie Sons, MD  [facility] [address] [place], [place] [address] [phone]

## 2022-08-22 ENCOUNTER — Encounter: Payer: Self-pay | Admitting: Urology

## 2022-08-22 ENCOUNTER — Encounter: Payer: Self-pay | Admitting: *Deleted

## 2022-08-23 ENCOUNTER — Other Ambulatory Visit: Payer: Self-pay | Admitting: Family Medicine

## 2022-08-23 MED ORDER — SILDENAFIL CITRATE 100 MG PO TABS: ORAL_TABLET | ORAL | 3 refills | Status: DC

## 2022-08-26 ENCOUNTER — Ambulatory Visit: Payer: Medicare HMO

## 2022-08-26 DIAGNOSIS — I428 Other cardiomyopathies: Secondary | ICD-10-CM | POA: Diagnosis not present

## 2022-08-27 LAB — CUP PACEART REMOTE DEVICE CHECK
Date Time Interrogation Session: 20240221230906
Implantable Pulse Generator Implant Date: 20221228

## 2022-09-03 ENCOUNTER — Encounter (INDEPENDENT_AMBULATORY_CARE_PROVIDER_SITE_OTHER): Payer: Medicare HMO

## 2022-09-03 DIAGNOSIS — Z0000 Encounter for general adult medical examination without abnormal findings: Secondary | ICD-10-CM

## 2022-09-03 NOTE — Progress Notes (Signed)
This encounter was created in error - please disregard.

## 2022-09-04 ENCOUNTER — Ambulatory Visit: Admission: RE | Admit: 2022-09-04 | Payer: Medicare HMO | Source: Ambulatory Visit

## 2022-09-05 ENCOUNTER — Ambulatory Visit: Admission: RE | Admit: 2022-09-05 | Payer: Medicare HMO | Source: Ambulatory Visit

## 2022-09-09 NOTE — Progress Notes (Signed)
Carelink Summary Report / Loop Recorder 

## 2022-09-14 NOTE — Patient Instructions (Signed)

## 2022-09-20 ENCOUNTER — Ambulatory Visit (INDEPENDENT_AMBULATORY_CARE_PROVIDER_SITE_OTHER): Payer: Medicare HMO | Admitting: Nurse Practitioner

## 2022-09-20 ENCOUNTER — Encounter: Payer: Self-pay | Admitting: Nurse Practitioner

## 2022-09-20 VITALS — BP 127/71 | HR 54 | Temp 97.6°F | Ht 67.01 in | Wt 121.8 lb

## 2022-09-20 DIAGNOSIS — I10 Essential (primary) hypertension: Secondary | ICD-10-CM | POA: Diagnosis not present

## 2022-09-20 DIAGNOSIS — R3914 Feeling of incomplete bladder emptying: Secondary | ICD-10-CM | POA: Diagnosis not present

## 2022-09-20 DIAGNOSIS — Z114 Encounter for screening for human immunodeficiency virus [HIV]: Secondary | ICD-10-CM | POA: Diagnosis not present

## 2022-09-20 DIAGNOSIS — F419 Anxiety disorder, unspecified: Secondary | ICD-10-CM

## 2022-09-20 DIAGNOSIS — F331 Major depressive disorder, recurrent, moderate: Secondary | ICD-10-CM | POA: Diagnosis not present

## 2022-09-20 DIAGNOSIS — Z0000 Encounter for general adult medical examination without abnormal findings: Secondary | ICD-10-CM

## 2022-09-20 DIAGNOSIS — B182 Chronic viral hepatitis C: Secondary | ICD-10-CM

## 2022-09-20 DIAGNOSIS — I429 Cardiomyopathy, unspecified: Secondary | ICD-10-CM | POA: Diagnosis not present

## 2022-09-20 DIAGNOSIS — G894 Chronic pain syndrome: Secondary | ICD-10-CM | POA: Diagnosis not present

## 2022-09-20 DIAGNOSIS — H401133 Primary open-angle glaucoma, bilateral, severe stage: Secondary | ICD-10-CM

## 2022-09-20 DIAGNOSIS — N401 Enlarged prostate with lower urinary tract symptoms: Secondary | ICD-10-CM | POA: Diagnosis not present

## 2022-09-20 DIAGNOSIS — I456 Pre-excitation syndrome: Secondary | ICD-10-CM

## 2022-09-20 DIAGNOSIS — I5022 Chronic systolic (congestive) heart failure: Secondary | ICD-10-CM | POA: Insufficient documentation

## 2022-09-20 DIAGNOSIS — G459 Transient cerebral ischemic attack, unspecified: Secondary | ICD-10-CM

## 2022-09-20 LAB — MICROALBUMIN, URINE WAIVED
Creatinine, Urine Waived: 50 mg/dL (ref 10–300)
Microalb, Ur Waived: 80 mg/L — ABNORMAL HIGH (ref 0–19)

## 2022-09-20 MED ORDER — LOSARTAN POTASSIUM 100 MG PO TABS: 100.0000 mg | ORAL_TABLET | Freq: Every day | ORAL | 4 refills | Status: DC

## 2022-09-20 MED ORDER — ASPIRIN 81 MG PO TBEC: 81.0000 mg | DELAYED_RELEASE_TABLET | Freq: Every day | ORAL | 4 refills | Status: DC

## 2022-09-20 MED ORDER — CARDIZEM CD 180 MG PO CP24: 180.0000 mg | ORAL_CAPSULE | Freq: Every day | ORAL | 4 refills | Status: DC

## 2022-09-20 MED ORDER — ROSUVASTATIN CALCIUM 10 MG PO TABS: 10.0000 mg | ORAL_TABLET | Freq: Every day | ORAL | 4 refills | Status: DC

## 2022-09-20 MED ORDER — CARVEDILOL 12.5 MG PO TABS: 12.5000 mg | ORAL_TABLET | Freq: Two times a day (BID) | ORAL | 4 refills | Status: DC

## 2022-09-20 NOTE — Assessment & Plan Note (Signed)
Chronic, ongoing.  Continue collaboration with cardiology.  Recent notes reviewed.  He is agreeable to trial of statin therapy.  Start Rosuvastatin 10 MG daily and adjust as needed based on levels.  Educated him on medication and side effects.

## 2022-09-20 NOTE — Progress Notes (Signed)
BP 127/71   Pulse (!) 54   Temp 97.6 F (36.4 C) (Oral)   Ht 5' 7.01" (1.702 m)   Wt 121 lb 12.8 oz (55.2 kg)   SpO2 99%   BMI 19.07 kg/m    Subjective:    Patient ID: Jesus Price, male    DOB: [birth year], 62 y.o.   MRN: [id number]  HPI: Jesus Price is a 62 y.o. male  Chief Complaint  Patient presents with   Hypertension   Hematuria    Seen a doctor last week sometime for this   HYPERTENSION  Continues on Carvedilol, Cardizem, and Losartan.  Currently wearing heart monitor and is to return in June to change out.  Follows with cardiology, last visit 12/19/21.  Has had since high school.  Has WPW.    Is a current every day smoker, used to smoke 1/2 PPD, but now smoking less.  Has smoked since he was 62 years old.  No alcohol use at home.  Has Chronic Hep C, no recent GI visits.  Saw urology on 08/21/22 = for microscopic hematuria.  They recommended a CT scan, but can not pay $300 OOP for scan.  He is frustrated by this, as reports his insurance will not cover this.  Has chronic pain to groin area for several years and he would like to have imaging done.   Hypertension status: controlled  Satisfied with current treatment? yes Duration of hypertension: chronic BP monitoring frequency:  not checking BP range:  BP medication side effects:  no Medication compliance: good compliance Aspirin: yes Recurrent headaches: no Visual changes: no Palpitations: yes Dyspnea: occasional Chest pain:  occasional with anxiety Lower extremity edema: no Dizzy/lightheaded: no  The ASCVD Risk score (Arnett DK, et al., 2019) failed to calculate for the following reasons:   The patient has a prior MI or stroke diagnosis   DEPRESSION Currently no medications, does not wish to take anything.  Has taken medication in past, but made him feel worse.  He would like to see a therapist.   Mood status: stable Satisfied with current treatment?: yes Symptom severity: mild Duration of  current treatment : chronic Side effects: no Medication compliance: good compliance Depressed mood: no Anxious mood: no Anhedonia: no Significant weight loss or gain: no Insomnia: none Fatigue: no Feelings of worthlessness or guilt: no Impaired concentration/indecisiveness: no Suicidal ideations: no Hopelessness: no Crying spells: no    09/20/2022   10:40 AM 03/22/2022   10:45 AM 09/24/2021    5:43 PM 09/14/2021    9:06 AM 07/18/2021    9:29 AM  Depression screen PHQ 2/9  Decreased Interest 0 1 2 3  0  Down, Depressed, Hopeless 2 1 2 2  0  PHQ - 2 Score 2 2 4 5  0  Altered sleeping 1 1 2 2    Tired, decreased energy 3 2 2 3    Change in appetite 1 2 2 3    Feeling bad or failure about yourself  0 0 0 0   Trouble concentrating 0 0 0 0   Moving slowly or fidgety/restless 0 1 0 0   Suicidal thoughts 0 0 0 0   PHQ-9 Score 7 8 10 13    Difficult doing work/chores Not difficult at all  Somewhat difficult Somewhat difficult        09/20/2022   10:40 AM 03/22/2022   10:45 AM 09/14/2021    9:07 AM 05/15/2021    9:10 AM  GAD 7 : Generalized Anxiety Score  Nervous, Anxious, on Edge 0 1 2 0  Control/stop worrying 1 0 2 1  Worry too much - different things 0 1 3 2   Trouble relaxing 1 1 2 2   Restless 0 0 1 1  Easily annoyed or irritable 2 2 3 3   Afraid - awful might happen 0 0 0 0  Total GAD 7 Score 4 5 13 9   Anxiety Difficulty Not difficult at all  Somewhat difficult Not difficult at all   Relevant past medical, surgical, family and social history reviewed and updated as indicated. Interim medical history since our last visit reviewed. Allergies and medications reviewed and updated.  Review of Systems  Constitutional:  Negative for activity change, diaphoresis, fatigue and fever.  Respiratory:  Negative for cough, chest tightness, shortness of breath and wheezing.   Cardiovascular:  Negative for chest pain, palpitations and leg swelling.  Gastrointestinal: Negative.   Musculoskeletal:   Positive for arthralgias.  Neurological: Negative.   Psychiatric/Behavioral:  Negative for decreased concentration, self-injury, sleep disturbance and suicidal ideas. The patient is nervous/anxious.     Per HPI unless specifically indicated above     Objective:    BP 127/71   Pulse (!) 54   Temp 97.6 F (36.4 C) (Oral)   Ht 5' 7.01" (1.702 m)   Wt 121 lb 12.8 oz (55.2 kg)   SpO2 99%   BMI 19.07 kg/m   Wt Readings from Last 3 Encounters:  09/20/22 121 lb 12.8 oz (55.2 kg)  08/21/22 117 lb (53.1 kg)  03/22/22 119 lb 11.2 oz (54.3 kg)    Physical Exam Vitals and nursing note reviewed.  Constitutional:      General: He is awake. He is not in acute distress.    Appearance: He is well-developed, well-groomed and underweight. He is not ill-appearing or toxic-appearing.  HENT:     Head: Normocephalic.     Right Ear: Hearing and external ear normal.     Left Ear: Hearing and external ear normal.  Eyes:     General: Lids are normal.     Extraocular Movements: Extraocular movements intact.     Conjunctiva/sclera: Conjunctivae normal.  Neck:     Thyroid: No thyromegaly.     Vascular: No carotid bruit.  Cardiovascular:     Rate and Rhythm: Regular rhythm. Bradycardia present.     Heart sounds: Normal heart sounds.  Pulmonary:     Effort: No accessory muscle usage or respiratory distress.     Breath sounds: Normal breath sounds.  Abdominal:     General: Bowel sounds are normal. There is no distension.     Palpations: Abdomen is soft.     Tenderness: There is no abdominal tenderness.  Musculoskeletal:     Cervical back: Full passive range of motion without pain.     Right lower leg: No edema.     Left lower leg: No edema.  Lymphadenopathy:     Cervical: No cervical adenopathy.  Skin:    General: Skin is warm.     Capillary Refill: Capillary refill takes less than 2 seconds.  Neurological:     Mental Status: He is alert and oriented to person, place, and time.     Deep  Tendon Reflexes: Reflexes are normal and symmetric.     Reflex Scores:      Brachioradialis reflexes are 2+ on the right side and 2+ on the left side.      Patellar reflexes are 2+ on the right side and 2+  on the left side. Psychiatric:        Attention and Perception: Attention normal.        Mood and Affect: Mood normal.        Speech: Speech normal.        Behavior: Behavior normal. Behavior is cooperative.        Thought Content: Thought content normal.    Results for orders placed or performed in visit on 09/20/22  Microalbumin, Urine Waived  Result Value Ref Range   Microalb, Ur Waived 80 (H) 0 - 19 mg/L   Creatinine, Urine Waived 50 10 - 300 mg/dL   Microalb/Creat Ratio 30-300 (H) <30 mg/g      Assessment & Plan:   Problem List Items Addressed This Visit       Cardiovascular and Mediastinum   Cardiomyopathy ([place]ec[place]-Bishop) - Primary    Chronic, ongoing.  Continue collaboration with cardiology.  Recent notes reviewed.  He is agreeable to trial of statin therapy.  Start Rosuvastatin 10 MG daily and adjust as needed based on levels.  Educated him on medication and side effects.      Relevant Medications   rosuvastatin (CRESTOR) 10 MG tablet   losartan (COZAAR) 100 MG tablet   carvedilol (COREG) 12.5 MG tablet   CARDIZEM CD 180 MG 24 hr capsule   aspirin EC 81 MG tablet   Other Relevant Orders   Comprehensive metabolic panel   Lipid Panel w/o Chol/HDL Ratio   Essential hypertension    Chronic, stable.  BP at goal today.  Recommend he monitor BP at least a few mornings a week at home and document.  DASH diet at home.  Continue current medication regimen and adjust as needed.  Labs today: CBC, CMP, TSH, Lipid, urine ALB.  Urine ALB 80 March 2024, continue Losartan for kidney protection.  Return in 6 months.       Relevant Medications   rosuvastatin (CRESTOR) 10 MG tablet   losartan (COZAAR) 100 MG tablet   carvedilol (COREG) 12.5 MG tablet   CARDIZEM CD 180 MG 24 hr capsule    aspirin EC 81 MG tablet   Other Relevant Orders   Microalbumin, Urine Waived (Completed)   CBC with Differential/Platelet   Comprehensive metabolic panel   Lipid Panel w/o Chol/HDL Ratio   TSH   TIA (transient ischemic attack)    History of per patient.  Discussed at length with him recommendations for statin use.  He is agreeable to trial of statin therapy.  Start Rosuvastatin 10 MG daily and adjust as needed based on levels.  Educated him on medication and side effects.      Relevant Medications   rosuvastatin (CRESTOR) 10 MG tablet   losartan (COZAAR) 100 MG tablet   carvedilol (COREG) 12.5 MG tablet   CARDIZEM CD 180 MG 24 hr capsule   aspirin EC 81 MG tablet   Wolff-Parkinson-White (WPW) syndrome    Chronic, stable. Continue to follow with cardiology and medication regimen as prescribed by them.      Relevant Medications   rosuvastatin (CRESTOR) 10 MG tablet   losartan (COZAAR) 100 MG tablet   carvedilol (COREG) 12.5 MG tablet   CARDIZEM CD 180 MG 24 hr capsule   aspirin EC 81 MG tablet     Digestive   Hepatitis C, chronic (HCC)    Chronic, stable.  No recent GI visits, will have him return as needed.        Genitourinary   Benign prostatic hyperplasia  with incomplete bladder emptying    Chronic, ongoing.  Followed by urology.  Recommend he reach out to insurance about his CT and cost.  PSA on labs today.  Continue current medication regimen.      Relevant Orders   PSA     Other   Anxiety    Refer to depression plan of care.      Chronic pain syndrome    Chronic, ongoing.  Is waiting on CT to determine further needs, have recommended he reach out to insurance on this and coverage.  Check labs today.      Relevant Medications   aspirin EC 81 MG tablet   Other Relevant Orders   Magnesium   Vitamin B12   VITAMIN D 25 Hydroxy (Vit-D Deficiency, Fractures)   Uric acid   Moderate episode of recurrent major depressive disorder (HCC)    Chronic, stable without  medication.  Would like a referral for therapy, which would be beneficial for him.  Referral in place.      Relevant Orders   Ambulatory referral to Psychology   Other Visit Diagnoses     Encounter for screening for HIV       HIV screen on labs today per guidelines for one time screening, discussed with patient.   Relevant Orders   HIV Antibody (routine testing w rflx)        Follow up plan: Return in about 6 months (around 03/23/2023) for HTN/HLD, DEPRESSION.

## 2022-09-20 NOTE — Assessment & Plan Note (Signed)
Chronic, ongoing.  Is waiting on CT to determine further needs, have recommended he reach out to insurance on this and coverage.  Check labs today.

## 2022-09-20 NOTE — Assessment & Plan Note (Signed)
Chronic, stable. Continue to follow with cardiology and medication regimen as prescribed by them.

## 2022-09-20 NOTE — Assessment & Plan Note (Signed)
History of per patient.  Discussed at length with him recommendations for statin use.  He is agreeable to trial of statin therapy.  Start Rosuvastatin 10 MG daily and adjust as needed based on levels.  Educated him on medication and side effects.

## 2022-09-20 NOTE — Assessment & Plan Note (Signed)
Refer to depression plan of care. 

## 2022-09-20 NOTE — Assessment & Plan Note (Signed)
Chronic, ongoing.  Followed by urology.  Recommend he reach out to insurance about his CT and cost.  PSA on labs today.  Continue current medication regimen.

## 2022-09-20 NOTE — Assessment & Plan Note (Signed)
Chronic, stable.  No recent GI visits, will have him return as needed.

## 2022-09-20 NOTE — Assessment & Plan Note (Signed)
Chronic, stable without medication.  Would like a referral for therapy, which would be beneficial for him.  Referral in place.

## 2022-09-20 NOTE — Assessment & Plan Note (Signed)
Chronic, stable.  BP at goal today.  Recommend he monitor BP at least a few mornings a week at home and document.  DASH diet at home.  Continue current medication regimen and adjust as needed.  Labs today: CBC, CMP, TSH, Lipid, urine ALB.  Urine ALB 80 March 2024, continue Losartan for kidney protection.  Return in 6 months.

## 2022-09-21 LAB — CBC WITH DIFFERENTIAL/PLATELET
Basophils Absolute: 0 10*3/uL (ref 0.0–0.2)
Basos: 1 %
EOS (ABSOLUTE): 0.1 10*3/uL (ref 0.0–0.4)
Eos: 2 %
Hematocrit: 38.8 % (ref 37.5–51.0)
Hemoglobin: 13.1 g/dL (ref 13.0–17.7)
Immature Grans (Abs): 0 10*3/uL (ref 0.0–0.1)
Immature Granulocytes: 0 %
Lymphocytes Absolute: 1.8 10*3/uL (ref 0.7–3.1)
Lymphs: 38 %
MCH: 32.2 pg (ref 26.6–33.0)
MCHC: 33.8 g/dL (ref 31.5–35.7)
MCV: 95 fL (ref 79–97)
Monocytes Absolute: 0.2 10*3/uL (ref 0.1–0.9)
Monocytes: 5 %
Neutrophils Absolute: 2.6 10*3/uL (ref 1.4–7.0)
Neutrophils: 54 %
Platelets: 201 10*3/uL (ref 150–450)
RBC: 4.07 x10E6/uL — ABNORMAL LOW (ref 4.14–5.80)
RDW: 12.5 % (ref 11.6–15.4)
WBC: 4.8 10*3/uL (ref 3.4–10.8)

## 2022-09-21 LAB — COMPREHENSIVE METABOLIC PANEL
ALT: 7 IU/L (ref 0–44)
AST: 14 IU/L (ref 0–40)
Albumin/Globulin Ratio: 1.3 (ref 1.2–2.2)
Albumin: 4.2 g/dL (ref 3.9–4.9)
Alkaline Phosphatase: 75 IU/L (ref 44–121)
BUN/Creatinine Ratio: 14 (ref 10–24)
BUN: 13 mg/dL (ref 8–27)
Bilirubin Total: 0.4 mg/dL (ref 0.0–1.2)
CO2: 23 mmol/L (ref 20–29)
Calcium: 9.1 mg/dL (ref 8.6–10.2)
Chloride: 103 mmol/L (ref 96–106)
Creatinine, Ser: 0.94 mg/dL (ref 0.76–1.27)
Globulin, Total: 3.2 g/dL (ref 1.5–4.5)
Glucose: 80 mg/dL (ref 70–99)
Potassium: 4.3 mmol/L (ref 3.5–5.2)
Sodium: 139 mmol/L (ref 134–144)
Total Protein: 7.4 g/dL (ref 6.0–8.5)
eGFR: 91 mL/min/{1.73_m2} (ref >59)

## 2022-09-21 LAB — MAGNESIUM: Magnesium: 2.1 mg/dL (ref 1.6–2.3)

## 2022-09-21 LAB — VITAMIN B12: Vitamin B-12: 464 pg/mL (ref 232–1245)

## 2022-09-21 LAB — URIC ACID: Uric Acid: 4 mg/dL (ref 3.8–8.4)

## 2022-09-21 LAB — LIPID PANEL W/O CHOL/HDL RATIO
Cholesterol, Total: 158 mg/dL (ref 100–199)
HDL: 59 mg/dL (ref >39)
LDL Chol Calc (NIH): 88 mg/dL (ref 0–99)
Triglycerides: 56 mg/dL (ref 0–149)
VLDL Cholesterol Cal: 11 mg/dL (ref 5–40)

## 2022-09-21 LAB — TSH: TSH: 2.05 u[IU]/mL (ref 0.450–4.500)

## 2022-09-21 LAB — VITAMIN D 25 HYDROXY (VIT D DEFICIENCY, FRACTURES): Vit D, 25-Hydroxy: 21 ng/mL — ABNORMAL LOW (ref 30.0–100.0)

## 2022-09-21 LAB — PSA: Prostate Specific Ag, Serum: 0.2 ng/mL (ref 0.0–4.0)

## 2022-09-21 LAB — HIV ANTIBODY (ROUTINE TESTING W REFLEX): HIV Screen 4th Generation wRfx: NONREACTIVE

## 2022-09-21 NOTE — Progress Notes (Signed)
Contacted via [place] afternoon Jesus Price, your labs have returned: - CBC shows no anemia or infection. - Vitamin D level on lower side, I would recommend you start taking over the counter Vitamin D3 2000 units daily for overall bone and muscle health. We will monitor levels. - Kidney function, creatinine and eGFR, remains normal, as is liver function, AST and ALT.  - Cholesterol labs, I would like to see LDL <55 with your history which the Rosuvastatin we are starting may be able to help [place] get to, so start this and let me know if any issues. - Remainder of your labs are in normal ranges, including the one time HIV screening that is recommended on all adults.  Overall stable labs with exception of Vitamin D.  Any questions? Keep being amazing!!  Thank you for allowing me to participate in your care.  I appreciate you. Kindest regards, Jesus Price

## 2022-09-24 ENCOUNTER — Ambulatory Visit (INDEPENDENT_AMBULATORY_CARE_PROVIDER_SITE_OTHER): Payer: Medicare HMO

## 2022-09-24 VITALS — Ht 67.0 in | Wt 121.0 lb

## 2022-09-24 DIAGNOSIS — Z0000 Encounter for general adult medical examination without abnormal findings: Secondary | ICD-10-CM

## 2022-09-24 NOTE — Patient Instructions (Signed)
[name] , Thank you for taking time to come for your Medicare Wellness Visit. I appreciate your ongoing commitment to your health goals. Please review the following plan we discussed and let me know if I can assist you in the future.   These are the goals we discussed:  Goals       DIET - EAT MORE FRUITS AND VEGETABLES      Patient Stated      Would like to decrease some of pain in back      Reduce and Manage My Symptoms of Anxiety and Depression. (pt-stated)      Timeframe:  Short-Term Goal Priority:  High Start Date:  09/21/2021                      Expected End Date:  12/21/2021                       Patient Goals/Self-Care Activities: Begin personal counseling with LCSW, on a bi-weekly basis, to reduce and manage symptoms of Anxiety, Depression, and Chronic Pain Syndrome, until well-established with a community mental health provider.   Incorporate into daily practice - relaxation techniques, deep breathing exercises and mindfulness meditation strategies. Review list of reputable psychiatrists, psychologists, licensed professional counselors, and licensed clinical social workers in [place], accepting [employer], mailed to you on 09/21/2021, and be prepared to discuss during our next scheduled telephone outreach call. Contact LCSW directly (# [phone]), if you have questions, need assistance, or if additional social work needs are identified.    Follow-Up Date:  09/27/2021 at 10:00 am         This is a list of the screening recommended for you and due dates:  Health Maintenance  Topic Date Due   Flu Shot  09/29/2022*   COVID-19 Vaccine (3 - Pfizer risk series) 10/09/2022*   Zoster (Shingles) Vaccine (1 of 2) 12/24/2022*   DTaP/Tdap/Td vaccine (4 - Td or Tdap) 12/12/2022   Medicare Annual Wellness Visit  09/24/2023   Colon Cancer Screening  07/01/2026   Hepatitis C Screening: USPSTF Recommendation to screen - Ages 18-79 yo.  Completed   HIV Screening   Completed   HPV Vaccine  Aged Out  *Topic was postponed. The date shown is not the original due date.    Advanced directives: no  Conditions/risks identified: none  Next appointment: Follow up in one year for your annual wellness visit 09/30/23 @ 11:15 pm by phone  Preventive Care 40-64 Years, Male Preventive care refers to lifestyle choices and visits with your health care provider that can promote health and wellness. What does preventive care include? A yearly physical exam. This is also called an annual well check. Dental exams once or twice a year. Routine eye exams. Ask your health care provider how often you should have your eyes checked. Personal lifestyle choices, including: Daily care of your teeth and gums. Regular physical activity. Eating a healthy diet. Avoiding tobacco and drug use. Limiting alcohol use. Practicing safe sex. Taking low-dose aspirin every day starting at age 49. What happens during an annual well check? The services and screenings done by your health care provider during your annual well check will depend on your age, overall health, lifestyle risk factors, and family history of disease. Counseling  Your health care provider may ask you questions about your: Alcohol use. Tobacco use. Drug use. Emotional well-being. Home and relationship well-being. Sexual activity. Eating habits. Work and work  environment. Screening  You may have the following tests or measurements: Height, weight, and BMI. Blood pressure. Lipid and cholesterol levels. These may be checked every 5 years, or more frequently if you are over 58 years old. Skin check. Lung cancer screening. You may have this screening every year starting at age 7 if you have a 30-pack-year history of smoking and currently smoke or have quit within the past 15 years. Fecal occult blood test (FOBT) of the stool. You may have this test every year starting at age 24. Flexible sigmoidoscopy or  colonoscopy. You may have a sigmoidoscopy every 5 years or a colonoscopy every 10 years starting at age 32. Prostate cancer screening. Recommendations will vary depending on your family history and other risks. Hepatitis C blood test. Hepatitis B blood test. Sexually transmitted disease (STD) testing. Diabetes screening. This is done by checking your blood sugar (glucose) after you have not eaten for a while (fasting). You may have this done every 1-3 years. Discuss your test results, treatment options, and if necessary, the need for more tests with your health care provider. Vaccines  Your health care provider may recommend certain vaccines, such as: Influenza vaccine. This is recommended every year. Tetanus, diphtheria, and acellular pertussis (Tdap, Td) vaccine. You may need a Td booster every 10 years. Zoster vaccine. You may need this after age 25. Pneumococcal 13-valent conjugate (PCV13) vaccine. You may need this if you have certain conditions and have not been vaccinated. Pneumococcal polysaccharide (PPSV23) vaccine. You may need one or two doses if you smoke cigarettes or if you have certain conditions. Talk to your health care provider about which screenings and vaccines you need and how often you need them. This information is not intended to replace advice given to you by your health care provider. Make sure you discuss any questions you have with your health care provider. Document Released: 07/14/2015 Document Revised: 03/06/2016 Document Reviewed: 04/18/2015 Elsevier Interactive Patient Education  2017 [place] Prevention in the Home Falls can cause injuries. They can happen to people of all ages. There are many things you can do to make your home safe and to help prevent falls. What can I do on the outside of my home? Regularly fix the edges of walkways and driveways and fix any cracks. Remove anything that might make you trip as you walk through a door, such as a  raised step or threshold. Trim any bushes or trees on the path to your home. Use bright outdoor lighting. Clear any walking paths of anything that might make someone trip, such as rocks or tools. Regularly check to see if handrails are loose or broken. Make sure that both sides of any steps have handrails. Any raised decks and porches should have guardrails on the edges. Have any leaves, snow, or ice cleared regularly. Use sand or salt on walking paths during winter. Clean up any spills in your garage right away. This includes oil or grease spills. What can I do in the bathroom? Use night lights. Install grab bars by the toilet and in the tub and shower. Do not use towel bars as grab bars. Use non-skid mats or decals in the tub or shower. If you need to sit down in the shower, use a plastic, non-slip stool. Keep the floor dry. Clean up any water that spills on the floor as soon as it happens. Remove soap buildup in the tub or shower regularly. Attach bath mats securely with double-sided non-slip rug  tape. Do not have throw rugs and other things on the floor that can make you trip. What can I do in the bedroom? Use night lights. Make sure that you have a light by your bed that is easy to reach. Do not use any sheets or blankets that are too big for your bed. They should not hang down onto the floor. Have a firm chair that has side arms. You can use this for support while you get dressed. Do not have throw rugs and other things on the floor that can make you trip. What can I do in the kitchen? Clean up any spills right away. Avoid walking on wet floors. Keep items that you use a lot in easy-to-reach places. If you need to reach something above you, use a strong step stool that has a grab bar. Keep electrical cords out of the way. Do not use floor polish or wax that makes floors slippery. If you must use wax, use non-skid floor wax. Do not have throw rugs and other things on the floor  that can make you trip. What can I do with my stairs? Do not leave any items on the stairs. Make sure that there are handrails on both sides of the stairs and use them. Fix handrails that are broken or loose. Make sure that handrails are as long as the stairways. Check any carpeting to make sure that it is firmly attached to the stairs. Fix any carpet that is loose or worn. Avoid having throw rugs at the top or bottom of the stairs. If you do have throw rugs, attach them to the floor with carpet tape. Make sure that you have a light switch at the top of the stairs and the bottom of the stairs. If you do not have them, ask someone to add them for you. What else can I do to help prevent falls? Wear shoes that: Do not have high heels. Have rubber bottoms. Are comfortable and fit you well. Are closed at the toe. Do not wear sandals. If you use a stepladder: Make sure that it is fully opened. Do not climb a closed stepladder. Make sure that both sides of the stepladder are locked into place. Ask someone to hold it for you, if possible. Clearly mark and make sure that you can see: Any grab bars or handrails. First and last steps. Where the edge of each step is. Use tools that help you move around (mobility aids) if they are needed. These include: Canes. Walkers. Scooters. Crutches. Turn on the lights when you go into a dark area. Replace any light bulbs as soon as they burn out. Set up your furniture so you have a clear path. Avoid moving your furniture around. If any of your floors are uneven, fix them. If there are any pets around you, be aware of where they are. Review your medicines with your doctor. Some medicines can make you feel dizzy. This can increase your chance of falling. Ask your doctor what other things that you can do to help prevent falls. This information is not intended to replace advice given to you by your health care provider. Make sure you discuss any questions you  have with your health care provider. Document Released: 04/13/2009 Document Revised: 11/23/2015 Document Reviewed: 07/22/2014 Elsevier Interactive Patient Education  2017 [employer].

## 2022-09-24 NOTE — Progress Notes (Signed)
I connected with  Illa Level on 09/24/22 by a audio enabled telemedicine application and verified that I am speaking with the correct person using two identifiers.  Patient Location: Home  Provider Location: Office/Clinic  I discussed the limitations of evaluation and management by telemedicine. The patient expressed understanding and agreed to proceed.  Subjective:   Khyson Francavilla is a 62 y.o. male who presents for Medicare Annual/Subsequent preventive examination.  Review of Systems     Cardiac Risk Factors include: advanced age (>51men, >28 women);male gender;smoking/ tobacco exposure;sedentary lifestyle;hypertension     Objective:    Today's Vitals   09/24/22 1428  PainSc: 4    There is no height or weight on file to calculate BMI.     09/24/2022    2:33 PM 09/24/2021    5:48 PM 07/18/2021    9:05 AM  Advanced Directives  Does Patient Have a Medical Advance Directive? No No No  Would patient like information on creating a medical advance directive? No - Patient declined No - Patient declined No - Patient declined    Current Medications (verified) Outpatient Encounter Medications as of 09/24/2022  Medication Sig   aspirin EC 81 MG tablet Take 1 tablet (81 mg total) by mouth daily. Swallow whole.   CARDIZEM CD 180 MG 24 hr capsule Take 1 capsule (180 mg total) by mouth daily.   carvedilol (COREG) 12.5 MG tablet Take 1 tablet (12.5 mg total) by mouth 2 (two) times daily.   fluticasone (FLONASE) 50 MCG/ACT nasal spray Place 1 spray into both nostrils daily.   ibuprofen (ADVIL) 600 MG tablet Take 1 tablet (600 mg total) by mouth every 6 (six) hours as needed.   latanoprost (XALATAN) 0.005 % ophthalmic solution Place 1 drop into both eyes at bedtime.   losartan (COZAAR) 100 MG tablet Take 1 tablet (100 mg total) by mouth daily.   meloxicam (MOBIC) 15 MG tablet Take 1 tablet (15 mg total) by mouth daily.   methocarbamol (ROBAXIN) 500 MG tablet Take 1 tablet (500  mg total) by mouth 2 (two) times daily.   rosuvastatin (CRESTOR) 10 MG tablet Take 1 tablet (10 mg total) by mouth daily.   sildenafil (VIAGRA) 100 MG tablet Take 1 tab 1 hour  prior to incourse   tamsulosin (FLOMAX) 0.4 MG CAPS capsule Take 1 capsule (0.4 mg total) by mouth daily.   No facility-administered encounter medications on file as of 09/24/2022.    Allergies (verified) Patient has no known allergies.   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   Wolff-Parkinson-White syndrome    Past Surgical History:  Procedure Laterality Date   EYE SURGERY     Family History  Problem Relation Age of Onset   Heart disease Mother    Lupus Sister    Lupus Maternal Grandmother    Social History   Socioeconomic History   Marital status: Married    Spouse name: Joshual Morale   Number of children: Not on file   Years of education: 12   Highest education level: 12th grade  Occupational History   Not on file  Tobacco Use   Smoking status: Every Day    Packs/day: .25    Types: Cigarettes    Passive exposure: Current   Smokeless tobacco: Never   Tobacco comments:    5 cigarettes per day   Vaping Use   Vaping Use:  Never used  Substance and Sexual Activity   Alcohol use: Never   Drug use: Never   Sexual activity: Not Currently  Other Topics Concern   Not on file  Social History Narrative   Not on file   Social Determinants of Health   Financial Resource Strain: Low Risk  (09/24/2022)   Overall Financial Resource Strain (CARDIA)    Difficulty of Paying Living Expenses: Not very hard  Food Insecurity: No Food Insecurity (09/24/2022)   Hunger Vital Sign    Worried About Running Out of Food in the Last Year: Never true    Ran Out of Food in the Last Year: Never true  Transportation Needs: No Transportation Needs (09/24/2022)   PRAPARE - Hydrologist (Medical): No     Lack of Transportation (Non-Medical): No  Physical Activity: Insufficiently Active (09/24/2022)   Exercise Vital Sign    Days of Exercise per Week: 2 days    Minutes of Exercise per Session: 20 min  Stress: No Stress Concern Present (09/24/2022)   [place]    Feeling of Stress : Only a little  Social Connections: Moderately Integrated (09/24/2022)   Social Connection and Isolation Panel [NHANES]    Frequency of Communication with Friends and Family: Never    Frequency of Social Gatherings with Friends and Family: Never    Attends Religious Services: More than 4 times per year    Active Member of [employer] or Organizations: Yes    Attends Archivist Meetings: 1 to 4 times per year    Marital Status: Married    Tobacco Counseling Ready to quit: Not Answered Counseling given: Not Answered Tobacco comments: 5 cigarettes per day    Clinical Intake:  Pre-visit preparation completed: Yes  Pain Score: 4  Pain Location: Leg     Nutritional Risks: None Diabetes: No  How often do you need to have someone help you when you read instructions, pamphlets, or other written materials from your doctor or pharmacy?: 1 - Never  Diabetic?no  Interpreter Needed?: No  Information entered by :: Kirke Shaggy, LPN   Activities of Daily Living    09/24/2022    2:35 PM  In your present state of health, do you have any difficulty performing the following activities:  Hearing? 0  Vision? 0  Difficulty concentrating or making decisions? 0  Walking or climbing stairs? 1  Dressing or bathing? 0  Doing errands, shopping? 0  Preparing Food and eating ? N  Using the Toilet? N  In the past six months, have you accidently leaked urine? N  Do you have problems with loss of bowel control? N  Managing your Medications? N  Managing your Finances? N  Housekeeping or managing your Housekeeping? Y    Patient Care  Team: Venita Lick, NP as PCP - General (Nurse Practitioner) Vickie Epley, MD as PCP - Electrophysiology (Cardiology)  Indicate any recent Medical Services you may have received from other than [facility] providers in the past year (date may be approximate).     Assessment:   This is a routine wellness examination for Wong.  Hearing/Vision screen Hearing Screening - Comments:: No aids Vision Screening - Comments:: Wears glasses- [facility]  Dietary issues and exercise activities discussed: Current Exercise Habits: Home exercise routine, Type of exercise: walking, Time (Minutes): 20, Frequency (Times/Week): 2, Weekly Exercise (Minutes/Week): 40, Intensity: Mild   Goals Addressed  This Visit's Progress    DIET - EAT MORE FRUITS AND VEGETABLES         Depression Screen    09/24/2022    2:31 PM 09/20/2022   10:40 AM 03/22/2022   10:45 AM 09/24/2021    5:43 PM 09/14/2021    9:06 AM 07/18/2021    9:29 AM 05/15/2021    9:10 AM  PHQ 2/9 Scores  PHQ - 2 Score 1 2 2 4 5  0 5  PHQ- 9 Score 4 7 8 10 13  14     Fall Risk    09/24/2022    2:34 PM 09/20/2022   10:40 AM 03/22/2022   10:45 AM 09/24/2021    5:48 PM 09/14/2021    9:06 AM  Fall Risk   Falls in the past year? 1 1 0 1 1  Number falls in past yr: 0 1 0 1 1  Injury with Fall? 0 1 0 0 0  Risk for fall due to : History of fall(s) History of fall(s) No Fall Risks History of fall(s);Impaired balance/gait;Impaired mobility Impaired balance/gait  Follow up Falls evaluation completed;Falls prevention discussed Falls evaluation completed  Falls evaluation completed;Education provided;Falls prevention discussed Falls evaluation completed    FALL RISK PREVENTION PERTAINING TO THE HOME:  Any stairs in or around the home? No  If so, are there any without handrails? No  Home free of loose throw rugs in walkways, pet beds, electrical cords, etc? Yes  Adequate lighting in your home to reduce risk of falls? Yes    ASSISTIVE DEVICES UTILIZED TO PREVENT FALLS:  Life alert? No  Use of a cane, walker or w/c? No  Grab bars in the bathroom? No  Shower chair or bench in shower? No  Elevated toilet seat or a handicapped toilet? No   Cognitive Function:        09/24/2022    2:38 PM  6CIT Screen  What Year? 0 points  What month? 0 points  What time? 0 points  Count back from 20 2 points  Months in reverse 0 points  Repeat phrase 0 points  Total Score 2 points    Immunizations Immunization History  Administered Date(s) Administered   Influenza,inj,Quad PF,6+ Mos 05/15/2021   PFIZER Comirnaty(Gray Top)Covid-19 Tri-Sucrose Vaccine 11/01/2019, 11/22/2019   Pneumococcal Polysaccharide-23 01/19/2016   Td 06/27/2000   Td (Adult), 2 Lf Tetanus Toxid, Preservative Free 06/27/2000   Tdap 12/11/2012    TDAP status: Up to date  Flu Vaccine status: Declined, Education has been provided regarding the importance of this vaccine but patient still declined. Advised may receive this vaccine at local pharmacy or Health Dept. Aware to provide a copy of the vaccination record if obtained from local pharmacy or Health Dept. Verbalized acceptance and understanding.  Pneumococcal vaccine status: Up to date  Covid-19 vaccine status: Completed vaccines  Qualifies for Shingles Vaccine? Yes   Zostavax completed No   Shingrix Completed?: No.    Education has been provided regarding the importance of this vaccine. Patient has been advised to call insurance company to determine out of pocket expense if they have not yet received this vaccine. Advised may also receive vaccine at local pharmacy or Health Dept. Verbalized acceptance and understanding.  Screening Tests Health Maintenance  Topic Date Due   INFLUENZA VACCINE  09/29/2022 (Originally 01/29/2022)   COVID-19 Vaccine (3 - Pfizer risk series) 10/09/2022 (Originally 12/20/2019)   Zoster Vaccines- Shingrix (1 of 2) 12/24/2022 (Originally 08/23/1977)    DTaP/Tdap/Td (4 -  Td or Tdap) 12/12/2022   Medicare Annual Wellness (AWV)  09/24/2023   COLONOSCOPY (Pts 45-61yrs Insurance coverage will need to be confirmed)  07/01/2026   Hepatitis C Screening  Completed   HIV Screening  Completed   HPV VACCINES  Aged Out    Health Maintenance  There are no preventive care reminders to display for this patient.   Colorectal cancer screening: Type of screening: Colonoscopy. Completed 07/01/16. Repeat every 10 years  Lung Cancer Screening: (Low Dose CT Chest recommended if Age 37-80 years, 30 pack-year currently smoking OR have quit w/in 15years.) does not qualify.   Additional Screening:  Hepatitis C Screening: does qualify; Completed 09/20/22  Vision Screening: Recommended annual ophthalmology exams for early detection of glaucoma and other disorders of the eye. Is the patient up to date with their annual eye exam?  Yes  Who is the provider or what is the name of the office in which the patient attends annual eye exams? [facility] If pt is not established with a provider, would they like to be referred to a provider to establish care? No .   Dental Screening: Recommended annual dental exams for proper oral hygiene  Community Resource Referral / Chronic Care Management: CRR required this visit?  No   CCM required this visit?  No      Plan:     I have personally reviewed and noted the following in the patient's chart:   Medical and social history Use of alcohol, tobacco or illicit drugs  Current medications and supplements including opioid prescriptions. Patient is not currently taking opioid prescriptions. Functional ability and status Nutritional status Physical activity Advanced directives List of other physicians Hospitalizations, surgeries, and ER visits in previous 12 months Vitals Screenings to include cognitive, depression, and falls Referrals and appointments  In addition, I have reviewed and discussed with patient  certain preventive protocols, quality metrics, and best practice recommendations. A written personalized care plan for preventive services as well as general preventive health recommendations were provided to patient.     Dionisio David, LPN   [phone]   Nurse Notes: none

## 2022-09-30 ENCOUNTER — Ambulatory Visit (INDEPENDENT_AMBULATORY_CARE_PROVIDER_SITE_OTHER): Payer: Medicare HMO

## 2022-09-30 ENCOUNTER — Other Ambulatory Visit: Payer: Self-pay | Admitting: Physician Assistant

## 2022-09-30 DIAGNOSIS — I428 Other cardiomyopathies: Secondary | ICD-10-CM | POA: Diagnosis not present

## 2022-09-30 DIAGNOSIS — I10 Essential (primary) hypertension: Secondary | ICD-10-CM

## 2022-09-30 NOTE — Telephone Encounter (Signed)
Requested Prescriptions  Refused Prescriptions Disp Refills   carvedilol (COREG) 12.5 MG tablet [Pharmacy Med Name: CARVEDILOL 12.5 MG Tablet] 180 tablet 3    Sig: TAKE 1 TABLET TWICE DAILY     Cardiovascular: Beta Blockers 3 Passed - 09/30/2022  1:51 AM      Passed - Cr in normal range and within 360 days    Creatinine, Ser  Date Value Ref Range Status  09/20/2022 0.94 0.76 - 1.27 mg/dL Final         Passed - AST in normal range and within 360 days    AST  Date Value Ref Range Status  09/20/2022 14 0 - 40 IU/L Final         Passed - ALT in normal range and within 360 days    ALT  Date Value Ref Range Status  09/20/2022 7 0 - 44 IU/L Final         Passed - Last BP in normal range    BP Readings from Last 1 Encounters:  09/20/22 127/71         Passed - Last Heart Rate in normal range    Pulse Readings from Last 1 Encounters:  09/20/22 (!) 54         Passed - Valid encounter within last 6 months    Recent Outpatient Visits           1 week ago Cardiomyopathy, unspecified type ([place])   [place], [place] T, NP   6 months ago Essential hypertension   [place], Dani Gobble, PA-C   1 year ago [place] Vigg, Avanti, MD   1 year ago TIA (transient ischemic attack)   [place] [facility] Vigg, Avanti, MD   1 year ago Need for influenza vaccination   [place] Vigg, Avanti, MD       Future Appointments             In 5 months Cannady, Barbaraann Faster, NP [place]

## 2022-10-01 LAB — CUP PACEART REMOTE DEVICE CHECK
Date Time Interrogation Session: 20240331230407
Implantable Pulse Generator Implant Date: 20221228

## 2022-10-07 NOTE — Progress Notes (Signed)
Carelink Summary Report / Loop Recorder 

## 2022-11-04 ENCOUNTER — Ambulatory Visit (INDEPENDENT_AMBULATORY_CARE_PROVIDER_SITE_OTHER): Payer: Medicare HMO

## 2022-11-04 DIAGNOSIS — I428 Other cardiomyopathies: Secondary | ICD-10-CM

## 2022-11-04 LAB — CUP PACEART REMOTE DEVICE CHECK
Date Time Interrogation Session: 20240503230345
Implantable Pulse Generator Implant Date: 20221228

## 2022-11-11 NOTE — Progress Notes (Signed)
Carelink Summary Report / Loop Recorder 

## 2022-11-13 ENCOUNTER — Encounter: Payer: Self-pay | Admitting: Cardiology

## 2022-11-14 NOTE — Telephone Encounter (Signed)
Called patient to request picture. Patient reports the device has moved down towards his nipple.

## 2022-11-22 ENCOUNTER — Ambulatory Visit (INDEPENDENT_AMBULATORY_CARE_PROVIDER_SITE_OTHER): Payer: Medicare HMO | Admitting: Nurse Practitioner

## 2022-11-22 ENCOUNTER — Encounter: Payer: Self-pay | Admitting: Nurse Practitioner

## 2022-11-22 VITALS — BP 117/69 | HR 58 | Temp 97.7°F | Ht 67.01 in | Wt 122.6 lb

## 2022-11-22 DIAGNOSIS — N4281 Prostatodynia syndrome: Secondary | ICD-10-CM | POA: Insufficient documentation

## 2022-11-22 LAB — URINALYSIS, ROUTINE W REFLEX MICROSCOPIC
Bilirubin, UA: NEGATIVE
Glucose, UA: NEGATIVE
Ketones, UA: NEGATIVE
Leukocytes,UA: NEGATIVE
Nitrite, UA: NEGATIVE
Protein,UA: NEGATIVE
RBC, UA: NEGATIVE
Specific Gravity, UA: 1.015 (ref 1.005–1.030)
Urobilinogen, Ur: 1 mg/dL (ref 0.2–1.0)
pH, UA: 6.5 (ref 5.0–7.5)

## 2022-11-22 MED ORDER — MELOXICAM 15 MG PO TABS: 15.0000 mg | ORAL_TABLET | Freq: Every day | ORAL | 1 refills | Status: AC

## 2022-11-22 MED ORDER — METHOCARBAMOL 500 MG PO TABS: 500.0000 mg | ORAL_TABLET | Freq: Four times a day (QID) | ORAL | 4 refills | Status: AC | PRN

## 2022-11-22 NOTE — Assessment & Plan Note (Addendum)
Ongoing issue, followed by urology and missed CT imaging due to cost.  Had at length conversation with him about need for this imaging to further determine cause of his pain, discussed option of payment plans to obtain imaging.  He is agreeable to this and verbalizes understanding the importance of this, have reached out to Dr. Lonna Cobb to reorder imaging for him as wishes to obtain.  Will continue collaboration with urology at this time + Flomax as ordered. PSA up to date.  Refills sent on Meloxicam and Robaxin.  UA obtained today.  He refuses GC/Chlam screening.

## 2022-11-22 NOTE — Progress Notes (Signed)
BP 117/69   Pulse (!) 58   Temp 97.7 F (36.5 C) (Oral)   Ht 5' 7.01" (1.702 m)   Wt 122 lb 9.6 oz (55.6 kg)   SpO2 99%   BMI 19.20 kg/m    Subjective:    Patient ID: Jesus Price, male    DOB: July 05, 1960, 62 y.o.   MRN: [id number]  HPI: Jesus Price is a 62 y.o. male  Chief Complaint  Patient presents with   Prostate pain    Urology wants him to get a test done and he can't pay for that.    PROSTATE PAIN Ongoing for months. Has seen urology for this issues, last visit on 08/21/22 where he had mild prostate and marked pelvic floor tenderness.  They sent a trial of Tamsulosin. He is to have a CT urogram to further assess this and microhematuria, however he reports he can not afford to have this done -- it was scheduled for 09/04/22 and he had no show for this.  Recent PSA stable in March 2024.  He is frustrated today with pain, currently has Robaxin and Meloxicam -- however has been out of these.  No history of STDs.  He reports he is a one woman man. Duration: months Nocturia: no -- he used to before Flomax Urinary frequency:yes Incomplete voiding: yes Urgency: yes Weak urinary stream: no Straining to start stream: yes Dysuria: a little stinging recently Onset: gradual Severity: moderate Alleviating factors: Flomax Aggravating factors: unknown Treatments attempted: Flomax IPSS Questionnaire (AUA-7): 16 AUA today Over the past month.   1)  How often have you had a sensation of not emptying your bladder completely after you finish urinating?  3 - About half the time  2)  How often have you had to urinate again less than two hours after you finished urinating? 3 - About half the time  3)  How often have you found you stopped and started again several times when you urinated?  4 - More than half the time  4) How difficult have you found it to postpone urination?  4 - More than half the time  5) How often have you had a weak urinary stream?  0 - Not at all  6)  How often have you had to push or strain to begin urination?  2 - Less than half the time  7) How many times did you most typically get up to urinate from the time you went to bed until the time you got up in the morning?  0 - None  Total score:  0-7 mildly symptomatic   8-19 moderately symptomatic   20-35 severely symptomatic   Relevant past medical, surgical, family and social history reviewed and updated as indicated. Interim medical history since our last visit reviewed. Allergies and medications reviewed and updated.  Review of Systems  Constitutional:  Negative for activity change, diaphoresis, fatigue and fever.  Respiratory:  Negative for cough, chest tightness, shortness of breath and wheezing.   Cardiovascular:  Negative for chest pain, palpitations and leg swelling.  Gastrointestinal: Negative.   Endocrine: Negative for cold intolerance, heat intolerance, polydipsia, polyphagia and polyuria.  Genitourinary:  Positive for frequency, hematuria and urgency. Negative for dysuria, penile discharge, penile pain and penile swelling.  Neurological: Negative.   Psychiatric/Behavioral: Negative.     Per HPI unless specifically indicated above     Objective:    BP 117/69   Pulse (!) 58   Temp 97.7 F (36.5 C) (  Oral)   Ht 5' 7.01" (1.702 m)   Wt 122 lb 9.6 oz (55.6 kg)   SpO2 99%   BMI 19.20 kg/m   Wt Readings from Last 3 Encounters:  11/22/22 122 lb 9.6 oz (55.6 kg)  09/24/22 121 lb (54.9 kg)  09/20/22 121 lb 12.8 oz (55.2 kg)    Physical Exam Vitals and nursing note reviewed.  Constitutional:      General: He is awake. He is not in acute distress.    Appearance: He is well-developed, well-groomed and underweight. He is not ill-appearing or toxic-appearing.  HENT:     Head: Normocephalic.     Right Ear: Hearing and external ear normal.     Left Ear: Hearing and external ear normal.  Eyes:     General: Lids are normal.     Extraocular Movements: Extraocular movements  intact.     Conjunctiva/sclera: Conjunctivae normal.  Neck:     Thyroid: No thyromegaly.     Vascular: No carotid bruit.  Cardiovascular:     Rate and Rhythm: Regular rhythm. Bradycardia present.     Heart sounds: Normal heart sounds.  Pulmonary:     Effort: No accessory muscle usage or respiratory distress.     Breath sounds: Normal breath sounds.  Abdominal:     General: Bowel sounds are normal. There is no distension.     Palpations: Abdomen is soft.     Tenderness: There is no abdominal tenderness.  Genitourinary:    Comments: Deferred prostate exam per request, had recent with urology. Musculoskeletal:     Cervical back: Full passive range of motion without pain.     Right lower leg: No edema.     Left lower leg: No edema.  Lymphadenopathy:     Cervical: No cervical adenopathy.  Skin:    General: Skin is warm.     Capillary Refill: Capillary refill takes less than 2 seconds.  Neurological:     Mental Status: He is alert and oriented to person, place, and time.     Deep Tendon Reflexes: Reflexes are normal and symmetric.     Reflex Scores:      Brachioradialis reflexes are 2+ on the right side and 2+ on the left side.      Patellar reflexes are 2+ on the right side and 2+ on the left side. Psychiatric:        Attention and Perception: Attention normal.        Mood and Affect: Mood normal.        Speech: Speech normal.        Behavior: Behavior normal. Behavior is cooperative.        Thought Content: Thought content normal.    Results for orders placed or performed in visit on 11/04/22  CUP PACEART REMOTE DEVICE CHECK  Result Value Ref Range   Date Time Interrogation Session [id number]    Pulse Generator Manufacturer [employer]    Pulse Gen Model LNQ22 LINQ II    Pulse Gen Serial Number [id number]    Clinic [facility]    Implantable Pulse Generator Type ICM/ILR    Implantable Pulse Generator Implant Date [id number]       Assessment & Plan:   Problem List  Items Addressed This Visit       Other   Painful prostate - Primary    Ongoing issue, followed by urology and missed CT imaging due to cost.  Had at length conversation with him about need for this  imaging to further determine cause of his pain, discussed option of payment plans to obtain imaging.  He is agreeable to this and verbalizes understanding the importance of this, have reached out to Dr. Lonna Cobb to reorder imaging for him as wishes to obtain.  Will continue collaboration with urology at this time + Flomax as ordered. PSA up to date.  Refills sent on Meloxicam and Robaxin.  UA obtained today.  He refuses GC/Chlam screening.      Relevant Orders   Urinalysis, Routine w reflex microscopic     Follow up plan: Return for as scheduled Sept 22nd with me.

## 2022-11-22 NOTE — Patient Instructions (Signed)

## 2022-11-28 NOTE — Progress Notes (Signed)
Cardiology Office Note Date:  11/29/2022  Patient ID:  Jesus Price, DOB [birth year], MRN [id number] PCP:  Marjie Skiff, NP  Cardiologist:  None Electrophysiologist: Lanier Prude, MD   Chief Complaint: 1 year follow-up palpitations, s/p ILR  History of Present Illness: Jesus Price is a 62 y.o. male with PMH notable for NICM, HTN, palpitations, family h/o sudden cardiac death; seen today for Lanier Prude, MD for routine electrophysiology followup.  He was noted to have short PR interval, s/p EP study 2019. No accessory pathway or inducible SVT. ILR implanted at that time. Has since had ILR explanted and re-implanted.  He last saw Dr. Lalla Price 11/2021, having palpitations. BP elevated in office > inc coreg to 12.5bid.   Today, he tells me that he continues to have palpitations, usually brief episodes lasting a couple seconds. No chest pain, pressure when having. No SOB. They are manageable and he is overall happy with level of control.   No edema, good appetite.   He checks BP at home, but not regularly. Readings he remembers are 130-140/70-80s  Device Information: MDT ILR, imp 05/2021   Past Medical History:  Diagnosis Date   Arthritis    Glaucoma    Hypertension    Situational depression 06/07/2016   Wolff-Parkinson-White syndrome     Past Surgical History:  Procedure Laterality Date   EYE SURGERY      Current Outpatient Medications  Medication Instructions   aspirin EC 81 mg, Oral, Daily, Swallow whole.   Cardizem CD 180 mg, Oral, Daily   carvedilol (COREG) 6.25 mg, Oral, 2 times daily   fluticasone (FLONASE) 50 MCG/ACT nasal spray 1 spray, Each Nare, Daily   ibuprofen (ADVIL) 600 mg, Oral, Every 6 hours PRN   latanoprost (XALATAN) 0.005 % ophthalmic solution 1 drop, Both Eyes, Daily at bedtime   losartan (COZAAR) 100 mg, Oral, Daily   meloxicam (MOBIC) 15 mg, Oral, Daily   methocarbamol (ROBAXIN) 500 mg, Oral, Every 6 hours PRN    rosuvastatin (CRESTOR) 10 mg, Oral, Daily   sildenafil (VIAGRA) 100 MG tablet Take 1 tab 1 hour  prior to incourse   tamsulosin (FLOMAX) 0.4 mg, Oral, Daily    Social History:  The patient  reports that he has been smoking cigarettes. He has been smoking an average of .25 packs per day. He has been exposed to tobacco smoke. He has never used smokeless tobacco. He reports that he does not drink alcohol and does not use drugs.   Family History:  The patient's family history includes Heart disease in his mother; Lupus in his maternal grandmother and sister. 2 siblings who died early from heart disease.  A brother had open heart surgery in his 30s and a sister had bypass surgery in her 30s.   ROS:  Please see the history of present illness. All other systems are reviewed and otherwise negative.   PHYSICAL EXAM:  VS:  BP 98/64 (BP Location: Left Arm, Patient Position: Sitting, Cuff Size: Normal)   Pulse (!) 44   Ht 5\' 7"  (1.702 m)   Wt 122 lb (55.3 kg)   SpO2 98%   BMI 19.11 kg/m  BMI: Body mass index is 19.11 kg/m.  GEN- The patient is well appearing, alert and oriented x 3 today.   Lungs- Clear to ausculation bilaterally, normal work of breathing.  Heart- Regular, bradycardic rate and rhythm, no murmurs, rubs or gallops Extremities- No peripheral edema, warm, dry Skin-  ILR insertion site  well-healed, no tethering   Device interrogation reviewed on [facility] by myself:  Battery good PVC burden 1.3% HR histograms shifted left.   EKG is ordered. Personal review of EKG from today shows:  SB, rate 44bpm;   Recent Labs: 09/20/2022: ALT 7; BUN 13; Creatinine, Ser 0.94; Hemoglobin 13.1; Magnesium 2.1; Platelets 201; Potassium 4.3; Sodium 139; TSH 2.050  09/20/2022: Cholesterol, Total 158; HDL 59; LDL Chol Calc (NIH) 88; Triglycerides 56   CrCl cannot be calculated (Patient's most recent lab result is older than the maximum 21 days allowed.).   Wt Readings from Last 3 Encounters:   11/29/22 122 lb (55.3 kg)  11/22/22 122 lb 9.6 oz (55.6 kg)  09/24/22 121 lb (54.9 kg)     Additional studies reviewed include: Previous EP, cardiology notes.   TTE, 01/11/2021  1. Left ventricular ejection fraction, by estimation, is 45 to 50%. The left ventricle has mildly decreased function. The left ventricle demonstrates global hypokinesis. Left ventricular diastolic parameters are indeterminate. The average left ventricular global longitudinal strain is -11.5 %. The global longitudinal strain is abnormal.   2. Right ventricular systolic function is low normal. The right ventricular size is normal.   3. The mitral valve is normal in structure. No evidence of mitral valve regurgitation.   4. The aortic valve is tricuspid. Aortic valve regurgitation is not visualized.   5. The inferior vena cava is normal in size with greater than 50% respiratory variability, suggesting right atrial pressure of 3 mmHg.   ASSESSMENT AND PLAN:  #) palpitations #) PVC #) bradycardia Low PVC burden by device Continues to have some palpitation symptoms, but overall happy with level of control Bradycardic in office today Histograms by device correlate to bradycardia Will decrease coreg to 6.25mg  BID   #) HTN Well-controlled, perhaps a little low in office today, though unsure of readings at home Requested patient check BP 3-4 times per week and send me measurements via mychart Adjust coreg as above   Current medicines are reviewed at length with the patient today.   The patient does not have concerns regarding his medicines.  The following changes were made today:   DECREASE coreg to 6.25mg  BID  Labs/ tests ordered today include:  Orders Placed This Encounter  Procedures   EKG 12-Lead     Disposition: Follow up with Dr. Lalla Price or EP APP in 12 months   Signed, Sherie Don, NP  11/29/22  11:53 AM  Electrophysiology [facility]

## 2022-11-29 ENCOUNTER — Ambulatory Visit: Payer: Medicare HMO | Attending: Cardiology | Admitting: Cardiology

## 2022-11-29 ENCOUNTER — Encounter: Payer: Self-pay | Admitting: Cardiology

## 2022-11-29 VITALS — BP 98/64 | HR 44 | Ht 67.0 in | Wt 122.0 lb

## 2022-11-29 DIAGNOSIS — I428 Other cardiomyopathies: Secondary | ICD-10-CM | POA: Diagnosis not present

## 2022-11-29 DIAGNOSIS — R002 Palpitations: Secondary | ICD-10-CM | POA: Diagnosis not present

## 2022-11-29 DIAGNOSIS — I10 Essential (primary) hypertension: Secondary | ICD-10-CM | POA: Diagnosis not present

## 2022-11-29 LAB — CUP PACEART INCLINIC DEVICE CHECK
Date Time Interrogation Session: 20240531123432
Implantable Pulse Generator Implant Date: 20221228

## 2022-11-29 MED ORDER — CARVEDILOL 6.25 MG PO TABS: 6.2500 mg | ORAL_TABLET | Freq: Two times a day (BID) | ORAL | 3 refills | Status: DC

## 2022-11-29 NOTE — Patient Instructions (Addendum)
Medication Instructions:   Your physician has recommended you make the following change in your medication:   DECREASE Carvedilol 6.25 mg table by mouth twice daily.  *If you need a refill on your cardiac medications before your next appointment, please call your pharmacy*   Lab Work:  No labs ordered today.  If you have labs (blood work) drawn today and your tests are completely normal, you will receive your results only by: MyChart Message (if you have MyChart) OR A paper copy in the mail If you have any lab test that is abnormal or we need to change your treatment, we will call you to review the results.   Testing/Procedures:  No testing ordered today.   Follow-Up: At [facility], you and your health needs are our priority.  As part of our continuing mission to provide you with exceptional heart care, we have created designated Provider Care Teams.  These Care Teams include your primary Cardiologist (physician) and Advanced Practice Providers (APPs -  Physician Assistants and Nurse Practitioners) who all work together to provide you with the care you need, when you need it.  We recommend signing up for the patient portal called "MyChart".  Sign up information is provided on this After Visit Summary.  MyChart is used to connect with patients for Virtual Visits (Telemedicine).  Patients are able to view lab/test results, encounter notes, upcoming appointments, etc.  Non-urgent messages can be sent to your provider as well.   To learn more about what you can do with MyChart, go to [email or web address].    Your next appointment:   12 month(s)  Provider:   Sherie Don, NP    *Please check your Blood Pressure several times a week* You may send your BP pressure readings via Mychart.

## 2022-12-04 NOTE — Progress Notes (Signed)
Carelink Summary Report / Loop Recorder 

## 2022-12-07 ENCOUNTER — Other Ambulatory Visit: Payer: Self-pay | Admitting: Physician Assistant

## 2022-12-07 DIAGNOSIS — I10 Essential (primary) hypertension: Secondary | ICD-10-CM

## 2022-12-09 ENCOUNTER — Other Ambulatory Visit: Payer: Self-pay

## 2022-12-09 ENCOUNTER — Ambulatory Visit (INDEPENDENT_AMBULATORY_CARE_PROVIDER_SITE_OTHER): Payer: Medicare HMO

## 2022-12-09 DIAGNOSIS — R002 Palpitations: Secondary | ICD-10-CM | POA: Diagnosis not present

## 2022-12-09 DIAGNOSIS — I10 Essential (primary) hypertension: Secondary | ICD-10-CM

## 2022-12-09 MED ORDER — ROSUVASTATIN CALCIUM 10 MG PO TABS: 10.0000 mg | ORAL_TABLET | Freq: Every day | ORAL | 4 refills | Status: DC

## 2022-12-09 MED ORDER — CARVEDILOL 6.25 MG PO TABS
6.2500 mg | ORAL_TABLET | Freq: Two times a day (BID) | ORAL | 3 refills | Status: DC
Start: 2022-12-09 — End: 2023-12-01

## 2022-12-09 NOTE — Telephone Encounter (Signed)
Requested Prescriptions  Pending Prescriptions Disp Refills   losartan (COZAAR) 100 MG tablet [Pharmacy Med Name: LOSARTAN POTASSIUM 100 MG Tablet] 90 tablet 0    Sig: TAKE 1 TABLET EVERY DAY     Cardiovascular:  Angiotensin Receptor Blockers Passed - 12/07/2022  4:13 AM      Passed - Cr in normal range and within 180 days    Creatinine, Ser  Date Value Ref Range Status  09/20/2022 0.94 0.76 - 1.27 mg/dL Final         Passed - K in normal range and within 180 days    Potassium  Date Value Ref Range Status  09/20/2022 4.3 3.5 - 5.2 mmol/L Final         Passed - Patient is not pregnant      Passed - Last BP in normal range    BP Readings from Last 1 Encounters:  11/29/22 98/64         Passed - Valid encounter within last 6 months    Recent Outpatient Visits           2 weeks ago Painful prostate   [place] [facility] [place], Corrie Dandy T, NP   2 months ago Cardiomyopathy, unspecified type (HCC)   [place] [facility] [place], Corrie Dandy T, NP   8 months ago Essential hypertension   [place] [facility] M[place], Oswaldo Conroy, PA-C   1 year ago Anxiety   [place] [facility] Vigg, Avanti, MD   1 year ago TIA (transient ischemic attack)   [place] [facility] Vigg, Avanti, MD       Future Appointments             In 3 months Cann[place], Dorie Rank, NP [facility], [facility]

## 2022-12-10 LAB — CUP PACEART REMOTE DEVICE CHECK
Date Time Interrogation Session: 20240609230228
Implantable Pulse Generator Implant Date: 20221228

## 2022-12-12 ENCOUNTER — Encounter: Payer: Self-pay | Admitting: Nurse Practitioner

## 2022-12-13 ENCOUNTER — Encounter: Payer: Self-pay | Admitting: Urology

## 2022-12-13 ENCOUNTER — Other Ambulatory Visit: Payer: Self-pay | Admitting: *Deleted

## 2022-12-13 DIAGNOSIS — R3129 Other microscopic hematuria: Secondary | ICD-10-CM

## 2022-12-25 ENCOUNTER — Ambulatory Visit
Admission: RE | Admit: 2022-12-25 | Discharge: 2022-12-25 | Disposition: A | Discharge status: Home / Self Care | Payer: Medicare HMO | Source: Ambulatory Visit | Attending: Urology | Admitting: Urology

## 2022-12-25 DIAGNOSIS — R3129 Other microscopic hematuria: Secondary | ICD-10-CM | POA: Insufficient documentation

## 2022-12-25 DIAGNOSIS — N3289 Other specified disorders of bladder: Secondary | ICD-10-CM | POA: Diagnosis not present

## 2022-12-25 DIAGNOSIS — K7689 Other specified diseases of liver: Secondary | ICD-10-CM | POA: Diagnosis not present

## 2022-12-25 DIAGNOSIS — R319 Hematuria, unspecified: Secondary | ICD-10-CM | POA: Diagnosis not present

## 2022-12-25 MED ORDER — IOHEXOL 300 MG/ML  SOLN
125.0000 mL | Freq: Once | INTRAMUSCULAR | Status: AC | PRN
  Administered 2022-12-25: 125 mL via INTRAVENOUS

## 2023-01-01 NOTE — Progress Notes (Signed)
Carelink Summary Report / Loop Recorder 

## 2023-01-10 DIAGNOSIS — H2511 Age-related nuclear cataract, right eye: Secondary | ICD-10-CM | POA: Diagnosis not present

## 2023-01-10 DIAGNOSIS — H34832 Tributary (branch) retinal vein occlusion, left eye, with macular edema: Secondary | ICD-10-CM | POA: Diagnosis not present

## 2023-01-10 DIAGNOSIS — H43813 Vitreous degeneration, bilateral: Secondary | ICD-10-CM | POA: Diagnosis not present

## 2023-01-10 DIAGNOSIS — H401113 Primary open-angle glaucoma, right eye, severe stage: Secondary | ICD-10-CM | POA: Diagnosis not present

## 2023-01-13 ENCOUNTER — Ambulatory Visit (INDEPENDENT_AMBULATORY_CARE_PROVIDER_SITE_OTHER): Payer: Medicare HMO

## 2023-01-13 DIAGNOSIS — I428 Other cardiomyopathies: Secondary | ICD-10-CM

## 2023-01-14 LAB — CUP PACEART REMOTE DEVICE CHECK
Date Time Interrogation Session: 20240712230342
Implantable Pulse Generator Implant Date: 20221228

## 2023-01-24 ENCOUNTER — Other Ambulatory Visit: Payer: Medicare HMO | Admitting: Urology

## 2023-01-29 ENCOUNTER — Other Ambulatory Visit: Payer: Self-pay

## 2023-01-29 ENCOUNTER — Ambulatory Visit (INDEPENDENT_AMBULATORY_CARE_PROVIDER_SITE_OTHER): Payer: Medicare HMO | Admitting: Urology

## 2023-01-29 ENCOUNTER — Encounter: Payer: Self-pay | Admitting: Urology

## 2023-01-29 VITALS — BP 131/79 | HR 56 | Ht 67.0 in | Wt 122.0 lb

## 2023-01-29 DIAGNOSIS — R3129 Other microscopic hematuria: Secondary | ICD-10-CM | POA: Diagnosis not present

## 2023-01-29 DIAGNOSIS — R102 Pelvic and perineal pain: Secondary | ICD-10-CM | POA: Diagnosis not present

## 2023-01-29 LAB — URINALYSIS, COMPLETE
Bilirubin, UA: NEGATIVE
Glucose, UA: NEGATIVE
Ketones, UA: NEGATIVE
Leukocytes,UA: NEGATIVE
Nitrite, UA: NEGATIVE
Protein,UA: NEGATIVE
RBC, UA: NEGATIVE
Specific Gravity, UA: 1.01 (ref 1.005–1.030)
Urobilinogen, Ur: 0.2 mg/dL (ref 0.2–1.0)
pH, UA: 6 (ref 5.0–7.5)

## 2023-01-29 LAB — MICROSCOPIC EXAMINATION: Bacteria, UA: NONE SEEN

## 2023-01-29 MED ORDER — TAMSULOSIN HCL 0.4 MG PO CAPS: ORAL_CAPSULE | ORAL | 1 refills | Status: DC

## 2023-01-29 NOTE — Progress Notes (Signed)
   01/29/23  CC:  Chief Complaint  Patient presents with   Cysto    HPI: Refer to my prior note 08/21/2022. CTU performed 12/25/2022 showed no upper tract abnormalities.  UA today negative RBC  Blood pressure 131/79, pulse (!) 56, height 5\' 7"  (1.702 m), weight 122 lb (55.3 kg). NED. A&Ox3.   No respiratory distress   Abd soft, NT, ND Normal phallus with bilateral descended testicles  Cystoscopy Procedure Note  Patient identification was confirmed, informed consent was obtained, and patient was prepped using Betadine solution.  Lidocaine jelly was administered per urethral meatus.     Pre-Procedure: - Inspection reveals a normal caliber urethral meatus.  Procedure: The flexible cystoscope was introduced without difficulty - No urethral strictures/lesions are present. -  Mild lateral lobe enlargement  prostate  - Tight bladder neck - Bilateral ureteral orifices identified - Bladder mucosa  reveals no ulcers, tumors, or lesions - No bladder stones -Moderate trabeculation  Retroflexion shows no lesions or intravesical median lobe   Post-Procedure: - Patient tolerated the procedure well  Assessment/ Plan: No mucosal abnormalities on cystoscopy Intermittent perineal pain which has improved on tamsulosin; will increase to 0.4 mg twice daily 49-month follow-up with repeat UA    Riki Altes, MD

## 2023-01-29 NOTE — Progress Notes (Signed)
Carelink Summary Report / Loop Recorder 

## 2023-02-17 ENCOUNTER — Ambulatory Visit: Payer: Medicare HMO

## 2023-02-17 DIAGNOSIS — I428 Other cardiomyopathies: Secondary | ICD-10-CM

## 2023-02-17 LAB — CUP PACEART REMOTE DEVICE CHECK
Date Time Interrogation Session: 20240818230707
Implantable Pulse Generator Implant Date: 20221228

## 2023-02-18 ENCOUNTER — Encounter: Payer: Self-pay | Admitting: Podiatry

## 2023-02-18 ENCOUNTER — Ambulatory Visit (INDEPENDENT_AMBULATORY_CARE_PROVIDER_SITE_OTHER): Payer: Medicare HMO | Admitting: Podiatry

## 2023-02-18 DIAGNOSIS — L600 Ingrowing nail: Secondary | ICD-10-CM

## 2023-02-18 NOTE — Progress Notes (Signed)
   Chief Complaint  Patient presents with   Nail Problem    "I have an ingrown toenail." N - ingrown toenail L - hallux right D - 1 yr. O - gradually worse C - sharp pain, thick, it's black A - certain shoes T - none    HPI: 62 y.o. male presenting today for evaluation of pain and tenderness associated to a right hallux nail plate.  For several years now the right hallux nail plate has been thick and painful.  It is causing pain in any close toed shoes.  He is unable to trim his nail.  He feels as if he has an ingrown toenail.  He is established with our practice and has presented occasionally for routine debridement.  He would like more definitive solution for his nail plate.  Past Medical History:  Diagnosis Date   Arthritis    Glaucoma    Hypertension    Situational depression 06/07/2016   Wolff-Parkinson-White syndrome     Past Surgical History:  Procedure Laterality Date   EYE SURGERY      No Known Allergies   Physical Exam: General: The patient is alert and oriented x3 in no acute distress.  Dermatology: Hyperkeratotic dystrophic toenail noted to the right hallux.  Associated tenderness with palpation  Vascular: Palpable pedal pulses bilaterally. Capillary refill within normal limits.  No appreciable edema.  No erythema.  Neurological: Grossly intact via light touch  Musculoskeletal Exam: No pedal deformities noted   Assessment/Plan of Care: 1.  Painful dystrophic ingrown toenail right hallux  -Patient evaluated -Today we discussed different treatment options including simple debridement versus nail avulsion procedure.  The patient would like to have the nail totally permanently removed.  The procedure was explained to the patient and he would like to have this performed. -The toe was prepped in aseptic manner and digital block performed using 3 mL of 2% lidocaine plain.  The nail was avulsed in its entirety followed by 3 x 30-second application of  phenol. -Dressings applied.  Post care instructions also provided -Return to clinic 4 weeks       Felecia Shelling, DPM [facility]  Dr. Felecia Shelling, DPM    [address] [place], [place] [address]                Office [phone]  Fax [phone]

## 2023-02-28 NOTE — Progress Notes (Signed)
Carelink Summary Report / Loop Recorder 

## 2023-03-18 ENCOUNTER — Ambulatory Visit (INDEPENDENT_AMBULATORY_CARE_PROVIDER_SITE_OTHER): Payer: Medicare HMO | Admitting: Podiatry

## 2023-03-18 DIAGNOSIS — L600 Ingrowing nail: Secondary | ICD-10-CM | POA: Diagnosis not present

## 2023-03-18 MED ORDER — SILVER SULFADIAZINE 1 % EX CREA: 1.0000 | TOPICAL_CREAM | Freq: Every day | CUTANEOUS | 1 refills | Status: DC

## 2023-03-18 NOTE — Progress Notes (Signed)
   No chief complaint on file.   Subjective: 62 y.o. male presents today status post permanent nail avulsion procedure of the right hallux nail plate that was performed on 02/18/2023.  Patient doing well.  He has been soaking his foot and applying Neosporin as instructed.  No new complaints.   Past Medical History:  Diagnosis Date   Arthritis    Glaucoma    Hypertension    Situational depression 06/07/2016   Wolff-Parkinson-White syndrome     Objective: Neurovascular status intact.  Skin is warm, dry and supple. Nail bed and respective nail fold appears to be healing appropriately.  There is some fibrotic debris along the nailbed but after debridement there is healthy underlying granular tissue.  Routine healing noted.  No clinical indication of infection  Assessment: #1 s/p total permanent nail me right hallux nail plate   Plan of care: -Patient evaluated -Light debridement of the nailbed was performed today.  Healing routinely. -Prescription for Silvadene cream to continue applying to the nailbed until completely healed.  Discontinue Neosporin -Return to clinic as needed  Felecia Shelling, DPM [facility]  Dr. Felecia Shelling, DPM    [address] [place], [place] [address]                Office [phone]  Fax [phone]

## 2023-03-23 DIAGNOSIS — E782 Mixed hyperlipidemia: Secondary | ICD-10-CM | POA: Insufficient documentation

## 2023-03-23 DIAGNOSIS — E559 Vitamin D deficiency, unspecified: Secondary | ICD-10-CM | POA: Insufficient documentation

## 2023-03-23 NOTE — Patient Instructions (Signed)

## 2023-03-24 ENCOUNTER — Ambulatory Visit: Payer: Medicare HMO

## 2023-03-24 DIAGNOSIS — R002 Palpitations: Secondary | ICD-10-CM

## 2023-03-24 DIAGNOSIS — I428 Other cardiomyopathies: Secondary | ICD-10-CM | POA: Diagnosis not present

## 2023-03-26 LAB — CUP PACEART REMOTE DEVICE CHECK
Date Time Interrogation Session: 20240920230010
Implantable Pulse Generator Implant Date: 20221228

## 2023-03-28 ENCOUNTER — Encounter: Payer: Self-pay | Admitting: Nurse Practitioner

## 2023-03-28 ENCOUNTER — Ambulatory Visit (INDEPENDENT_AMBULATORY_CARE_PROVIDER_SITE_OTHER): Payer: Medicare HMO | Admitting: Nurse Practitioner

## 2023-03-28 VITALS — BP 114/69 | HR 66 | Temp 98.0°F | Ht 67.01 in | Wt 121.2 lb

## 2023-03-28 DIAGNOSIS — B182 Chronic viral hepatitis C: Secondary | ICD-10-CM

## 2023-03-28 DIAGNOSIS — I428 Other cardiomyopathies: Secondary | ICD-10-CM | POA: Diagnosis not present

## 2023-03-28 DIAGNOSIS — Z23 Encounter for immunization: Secondary | ICD-10-CM

## 2023-03-28 DIAGNOSIS — I456 Pre-excitation syndrome: Secondary | ICD-10-CM

## 2023-03-28 DIAGNOSIS — I10 Essential (primary) hypertension: Secondary | ICD-10-CM | POA: Diagnosis not present

## 2023-03-28 DIAGNOSIS — F419 Anxiety disorder, unspecified: Secondary | ICD-10-CM

## 2023-03-28 DIAGNOSIS — N4281 Prostatodynia syndrome: Secondary | ICD-10-CM

## 2023-03-28 DIAGNOSIS — E782 Mixed hyperlipidemia: Secondary | ICD-10-CM

## 2023-03-28 DIAGNOSIS — F331 Major depressive disorder, recurrent, moderate: Secondary | ICD-10-CM | POA: Diagnosis not present

## 2023-03-28 MED ORDER — ROSUVASTATIN CALCIUM 10 MG PO TABS: 10.0000 mg | ORAL_TABLET | Freq: Every day | ORAL | 4 refills | Status: DC

## 2023-03-28 NOTE — Assessment & Plan Note (Signed)
Chronic, ongoing.  Continue collaboration with cardiology.  Recent notes reviewed.

## 2023-03-28 NOTE — Assessment & Plan Note (Addendum)
Chronic, stable.  BP well below goal today.  Recommend he monitor BP at least a few mornings a week at home and document.  DASH diet at home.  Continue current medication regimen and adjust as needed.  Labs today: CMP and CBC.  Urine ALB 80 March 2024, continue Losartan for kidney protection.  Return in 6 months.

## 2023-03-28 NOTE — Progress Notes (Signed)
BP 114/69   Pulse 66   Temp 98 F (36.7 C) (Oral)   Ht 5' 7.01" (1.702 m)   Wt 121 lb 3.2 oz (55 kg)   SpO2 97%   BMI 18.98 kg/m    Subjective:    Patient ID: Jesus Price, male    DOB: 11-17-1960, 62 y.o.   MRN: [id number]  HPI: Jesus Price is a 62 y.o. male  Chief Complaint  Patient presents with   Hyperlipidemia   Hypertension    Pt refused flu shot but advised can get shingles/TDAP at pharmacy due to Medicare will not cover here in office   Depression    Has been in better mind state   HYPERTENSION & HYPERLIPIDEMIA Taking Carvedilol, Cardizem, and Losartan.  Last saw cardiology on 11/29/22 they decreased Coreg to 6.25 MG BID.  Has WPW. Is a current every day smoker, 5 cigarettes a day.  Has smoked since he was 62 years old.  No alcohol use at home.  Taking Rosuvastatin daily for HLD.  History of Hep C was treated in 2015 and overall has been stable since. Hypertension status: controlled  Satisfied with current treatment? yes Duration of hypertension: chronic BP monitoring frequency:  not checking BP range:  BP medication side effects:  no Medication compliance: good compliance Aspirin: yes Recurrent headaches: no Visual changes: no Palpitations: yes Dyspnea: occasional Chest pain: no Lower extremity edema: no Dizzy/lightheaded: no  The ASCVD Risk score (Arnett DK, et al., 2019) failed to calculate for the following reasons:   The patient has a prior MI or stroke diagnosis   DEPRESSION Currently no medications, does not wish to take anything.  Ordered a visit with therapist last visit, but did not attend. Mood status: stable Satisfied with current treatment?: yes Symptom severity: mild Duration of current treatment : chronic Side effects: no Medication compliance: good compliance Depressed mood: occasional Anxious mood: occasional Anhedonia: no Significant weight loss or gain: no Insomnia: none Fatigue: no Feelings of worthlessness or  guilt: no Impaired concentration/indecisiveness: no Suicidal ideations: no Hopelessness: no Crying spells: no    03/28/2023   10:46 AM 11/22/2022    1:25 PM 09/24/2022    2:31 PM 09/20/2022   10:40 AM 03/22/2022   10:45 AM  Depression screen PHQ 2/9  Decreased Interest 2 2 0 0 1  Down, Depressed, Hopeless 1 0 1 2 1   PHQ - 2 Score 3 2 1 2 2   Altered sleeping 0 0 1 1 1   Tired, decreased energy 3 1 1 3 2   Change in appetite 0 0 0 1 2  Feeling bad or failure about yourself  0 0 0 0 0  Trouble concentrating 0 0 1 0 0  Moving slowly or fidgety/restless 0 0 0 0 1  Suicidal thoughts 0 0 0 0 0  PHQ-9 Score 6 3 4 7 8   Difficult doing work/chores Somewhat difficult  Not difficult at all Not difficult at all        03/28/2023   10:47 AM 11/22/2022    1:25 PM 09/20/2022   10:40 AM 03/22/2022   10:45 AM  GAD 7 : Generalized Anxiety Score  Nervous, Anxious, on Edge 1 1 0 1  Control/stop worrying 2 0 1 0  Worry too much - different things 1 1 0 1  Trouble relaxing 0 0 1 1  Restless 0 0 0 0  Easily annoyed or irritable 2 1 2 2   Afraid - awful might happen 0  0 0 0  Total GAD 7 Score 6 3 4 5   Anxiety Difficulty Somewhat difficult  Not difficult at all    Relevant past medical, surgical, family and social history reviewed and updated as indicated. Interim medical history since our last visit reviewed. Allergies and medications reviewed and updated.  Review of Systems  Constitutional:  Negative for activity change, diaphoresis, fatigue and fever.  Respiratory:  Negative for cough, chest tightness, shortness of breath and wheezing.   Cardiovascular:  Negative for chest pain, palpitations and leg swelling.  Gastrointestinal: Negative.   Neurological: Negative.   Psychiatric/Behavioral:  Negative for decreased concentration, self-injury, sleep disturbance and suicidal ideas. The patient is nervous/anxious.    Per HPI unless specifically indicated above     Objective:    BP 114/69   Pulse  66   Temp 98 F (36.7 C) (Oral)   Ht 5' 7.01" (1.702 m)   Wt 121 lb 3.2 oz (55 kg)   SpO2 97%   BMI 18.98 kg/m   Wt Readings from Last 3 Encounters:  03/28/23 121 lb 3.2 oz (55 kg)  01/29/23 122 lb (55.3 kg)  11/29/22 122 lb (55.3 kg)    Physical Exam Vitals and nursing note reviewed.  Constitutional:      General: He is awake. He is not in acute distress.    Appearance: Normal appearance. He is well-developed, well-groomed and underweight. He is not ill-appearing or toxic-appearing.  HENT:     Head: Normocephalic.     Right Ear: Hearing and external ear normal.     Left Ear: Hearing and external ear normal.  Eyes:     General: Lids are normal.     Extraocular Movements: Extraocular movements intact.     Conjunctiva/sclera: Conjunctivae normal.  Neck:     Thyroid: No thyromegaly.     Vascular: No carotid bruit.  Cardiovascular:     Rate and Rhythm: Normal rate and regular rhythm.     Heart sounds: Normal heart sounds. No murmur heard.    No gallop.  Pulmonary:     Effort: No accessory muscle usage or respiratory distress.     Breath sounds: Normal breath sounds.  Abdominal:     General: Bowel sounds are normal. There is no distension.     Palpations: Abdomen is soft.     Tenderness: There is no abdominal tenderness.  Musculoskeletal:     Cervical back: Full passive range of motion without pain.     Right lower leg: No edema.     Left lower leg: No edema.  Lymphadenopathy:     Cervical: No cervical adenopathy.  Skin:    General: Skin is warm.     Capillary Refill: Capillary refill takes less than 2 seconds.  Neurological:     Mental Status: He is alert and oriented to person, place, and time.     Deep Tendon Reflexes: Reflexes are normal and symmetric.     Reflex Scores:      Brachioradialis reflexes are 2+ on the right side and 2+ on the left side.      Patellar reflexes are 2+ on the right side and 2+ on the left side. Psychiatric:        Attention and  Perception: Attention normal.        Mood and Affect: Mood normal.        Speech: Speech normal.        Behavior: Behavior normal. Behavior is cooperative.  Thought Content: Thought content normal.    Results for orders placed or performed in visit on 03/24/23  CUP PACEART REMOTE DEVICE CHECK  Result Value Ref Range   Date Time Interrogation Session [id number]    Pulse Generator Manufacturer MERM    Pulse Gen Model LNQ22 LINQ II    Pulse Gen Serial Number [id number]    Clinic Name [facility]    Implantable Pulse Generator Type ICM/ILR    Implantable Pulse Generator Implant Date [id number]    Eval Rhythm SR at 60 bpm       Assessment & Plan:   Problem List Items Addressed This Visit       Cardiovascular and Mediastinum   Essential hypertension    Chronic, stable.  BP well below goal today.  Recommend he monitor BP at least a few mornings a week at home and document.  DASH diet at home.  Continue current medication regimen and adjust as needed.  Labs today: CMP and CBC.  Urine ALB 80 March 2024, continue Losartan for kidney protection.  Return in 6 months.       Relevant Medications   rosuvastatin (CRESTOR) 10 MG tablet   Other Relevant Orders   Comprehensive metabolic panel   CBC with Differential/Platelet   NICM (nonischemic cardiomyopathy) (HCC)    Chronic, ongoing.  Continue collaboration with cardiology.  Recent notes reviewed.       Relevant Medications   rosuvastatin (CRESTOR) 10 MG tablet   Wolff-Parkinson-White (WPW) syndrome    Chronic, stable. Continue to follow with cardiology and medication regimen as prescribed by them.      Relevant Medications   rosuvastatin (CRESTOR) 10 MG tablet     Digestive   Hepatitis C, chronic (HCC)    Chronic, stable.  No recent GI visits, will have him return as needed.      Relevant Orders   Comprehensive metabolic panel     Other   Anxiety    Chronic, stable without medication.  Wishes to continue off  medication.  Denies SI/HI.      Mixed hyperlipidemia    Chronic, ongoing.  Continue Rosuvastatin daily and adjust as needed.  Lipid panel today.      Relevant Medications   rosuvastatin (CRESTOR) 10 MG tablet   Other Relevant Orders   CBC with Differential/Platelet   Lipid Panel w/o Chol/HDL Ratio   Moderate episode of recurrent major depressive disorder (HCC) - Primary    Chronic, stable without medication.  Wishes to continue off medication.  Denies SI/HI.        Follow up plan: Return in about 6 months (around 09/25/2023) for Annual physical after 09/20/23.

## 2023-03-28 NOTE — Assessment & Plan Note (Signed)
Chronic, stable. Continue to follow with cardiology and medication regimen as prescribed by them.

## 2023-03-28 NOTE — Assessment & Plan Note (Signed)
Chronic, stable without medication.  Wishes to continue off medication.  Denies SI/HI.

## 2023-03-28 NOTE — Assessment & Plan Note (Signed)
Chronic, ongoing.  Continue Rosuvastatin daily and adjust as needed.  Lipid panel today.

## 2023-03-28 NOTE — Assessment & Plan Note (Signed)
Chronic, stable.  No recent GI visits, will have him return as needed.

## 2023-03-29 LAB — CBC WITH DIFFERENTIAL/PLATELET
Basophils Absolute: 0.1 10*3/uL (ref 0.0–0.2)
Basos: 1 %
EOS (ABSOLUTE): 0.1 10*3/uL (ref 0.0–0.4)
Eos: 1 %
Hematocrit: 38.1 % (ref 37.5–51.0)
Hemoglobin: 12.7 g/dL — ABNORMAL LOW (ref 13.0–17.7)
Immature Grans (Abs): 0 10*3/uL (ref 0.0–0.1)
Immature Granulocytes: 0 %
Lymphocytes Absolute: 2.2 10*3/uL (ref 0.7–3.1)
Lymphs: 43 %
MCH: 32.3 pg (ref 26.6–33.0)
MCHC: 33.3 g/dL (ref 31.5–35.7)
MCV: 97 fL (ref 79–97)
Monocytes Absolute: 0.3 10*3/uL (ref 0.1–0.9)
Monocytes: 6 %
Neutrophils Absolute: 2.5 10*3/uL (ref 1.4–7.0)
Neutrophils: 49 %
Platelets: 181 10*3/uL (ref 150–450)
RBC: 3.93 x10E6/uL — ABNORMAL LOW (ref 4.14–5.80)
RDW: 12.5 % (ref 11.6–15.4)
WBC: 5.1 10*3/uL (ref 3.4–10.8)

## 2023-03-29 LAB — COMPREHENSIVE METABOLIC PANEL
ALT: 9 [IU]/L (ref 0–44)
AST: 16 [IU]/L (ref 0–40)
Albumin: 4.2 g/dL (ref 3.9–4.9)
Alkaline Phosphatase: 69 [IU]/L (ref 44–121)
BUN/Creatinine Ratio: 15 (ref 10–24)
BUN: 15 mg/dL (ref 8–27)
Bilirubin Total: 0.4 mg/dL (ref 0.0–1.2)
CO2: 24 mmol/L (ref 20–29)
Calcium: 9.2 mg/dL (ref 8.6–10.2)
Chloride: 103 mmol/L (ref 96–106)
Creatinine, Ser: 1 mg/dL (ref 0.76–1.27)
Globulin, Total: 3 g/dL (ref 1.5–4.5)
Glucose: 62 mg/dL — ABNORMAL LOW (ref 70–99)
Potassium: 4.4 mmol/L (ref 3.5–5.2)
Sodium: 141 mmol/L (ref 134–144)
Total Protein: 7.2 g/dL (ref 6.0–8.5)
eGFR: 84 mL/min/{1.73_m2} (ref >59)

## 2023-03-29 LAB — LIPID PANEL W/O CHOL/HDL RATIO
Cholesterol, Total: 121 mg/dL (ref 100–199)
HDL: 54 mg/dL (ref >39)
LDL Chol Calc (NIH): 54 mg/dL (ref 0–99)
Triglycerides: 56 mg/dL (ref 0–149)
VLDL Cholesterol Cal: 13 mg/dL (ref 5–40)

## 2023-03-30 NOTE — Progress Notes (Signed)
Contacted via MyChart   Good morning Jesus Price, your labs have returned and overall are stable with exception of mildly low hemoglobin.  We will recheck this next visit.  Ensure you are getting lots of iron rich foods into diet daily.  Any questions? Keep being stellar!!  Thank you for allowing me to participate in your care.  I appreciate you. Kindest regards, Darius Lundberg

## 2023-04-08 NOTE — Progress Notes (Signed)
Carelink Summary Report / Loop Recorder 

## 2023-04-23 ENCOUNTER — Other Ambulatory Visit: Payer: Self-pay | Admitting: Nurse Practitioner

## 2023-04-23 DIAGNOSIS — I10 Essential (primary) hypertension: Secondary | ICD-10-CM

## 2023-04-24 NOTE — Telephone Encounter (Signed)
Requested Prescriptions  Pending Prescriptions Disp Refills   losartan (COZAAR) 100 MG tablet [Pharmacy Med Name: Losartan Potassium Oral Tablet 100 MG] 90 tablet 3    Sig: TAKE 1 TABLET EVERY DAY     Cardiovascular:  Angiotensin Receptor Blockers Passed - 04/23/2023  6:04 AM      Passed - Cr in normal range and within 180 days    Creatinine, Ser  Date Value Ref Range Status  03/28/2023 1.00 0.76 - 1.27 mg/dL Final         Passed - K in normal range and within 180 days    Potassium  Date Value Ref Range Status  03/28/2023 4.4 3.5 - 5.2 mmol/L Final         Passed - Patient is not pregnant      Passed - Last BP in normal range    BP Readings from Last 1 Encounters:  03/28/23 114/69         Passed - Valid encounter within last 6 months    Recent Outpatient Visits           3 weeks ago Moderate episode of recurrent major depressive disorder (HCC)   [place] [facility] [place], Dorie Rank, NP   5 months ago Painful prostate   [place] [facility] [place], Paden T, NP   7 months ago Cardiomyopathy, unspecified type [facility])   [place] [facility] [place], Corrie Dandy T, NP   1 year ago Essential hypertension   [place] [facility] Mecum, Oswaldo Conroy, PA-C   1 year ago Anxiety   [place] [facility] Vigg, Avanti, MD       Future Appointments             In 3 months Stoioff, Verna Czech, MD [facility] [place]   In 5 months [place], Dorie Rank, NP [place] [facility]

## 2023-04-28 ENCOUNTER — Ambulatory Visit (INDEPENDENT_AMBULATORY_CARE_PROVIDER_SITE_OTHER): Payer: Medicare HMO

## 2023-04-28 DIAGNOSIS — I428 Other cardiomyopathies: Secondary | ICD-10-CM

## 2023-04-28 DIAGNOSIS — R002 Palpitations: Secondary | ICD-10-CM

## 2023-04-28 LAB — CUP PACEART REMOTE DEVICE CHECK
Date Time Interrogation Session: 20241027230811
Implantable Pulse Generator Implant Date: 20221228

## 2023-05-19 ENCOUNTER — Ambulatory Visit: Payer: Medicare HMO | Admitting: Nurse Practitioner

## 2023-05-19 NOTE — Progress Notes (Signed)
Carelink Summary Report / Loop Recorder 

## 2023-06-01 LAB — CUP PACEART REMOTE DEVICE CHECK
Date Time Interrogation Session: 20241129230252
Implantable Pulse Generator Implant Date: 20221228

## 2023-06-02 ENCOUNTER — Ambulatory Visit: Payer: Medicare HMO

## 2023-06-02 DIAGNOSIS — R002 Palpitations: Secondary | ICD-10-CM

## 2023-06-19 ENCOUNTER — Telehealth: Payer: Self-pay | Admitting: Nurse Practitioner

## 2023-06-19 NOTE — Telephone Encounter (Signed)
Medication Refill -  Most Recent Primary Care Visit:  Provider: Aura Dials T  Department: [facility]  Visit Type: OFFICE VISIT  Date: 03/28/2023  Medication: rosuvastatin (CRESTOR) 10 MG tablet  Has the patient contacted their pharmacy? Yes (Agent: If yes, when and what did the pharmacy advise?) Contact office.   Is this the correct pharmacy for this prescription? Yes This is the patient's preferred pharmacy:  [id number] [place], [place] - [address] AT [facility] [address] [place] [address] Phone: [phone] Fax: [phone]  Has the prescription been filled recently? No  Is the patient out of the medication? No  Has the patient been seen for an appointment in the last year OR does the patient have an upcoming appointment? Yes  Can we respond through MyChart? Yes  Agent: Please be advised that Rx refills may take up to 3 business days. We ask that you follow-up with your pharmacy.

## 2023-06-19 NOTE — Telephone Encounter (Signed)
[facility] called and spoke to Jesus Price, Pensions consultant about the refill(s) rosuvastatin requested. Advised it was sent on 03/28/23 #90/4 refill(s). He says it's on file and it can be ready for pickup on Saturday after 1100.

## 2023-07-07 ENCOUNTER — Ambulatory Visit (INDEPENDENT_AMBULATORY_CARE_PROVIDER_SITE_OTHER): Payer: Medicare HMO

## 2023-07-07 DIAGNOSIS — R002 Palpitations: Secondary | ICD-10-CM

## 2023-07-09 LAB — CUP PACEART REMOTE DEVICE CHECK
Date Time Interrogation Session: 20250105230922
Implantable Pulse Generator Implant Date: 20221228

## 2023-07-25 ENCOUNTER — Telehealth: Payer: Self-pay | Admitting: Nurse Practitioner

## 2023-07-25 NOTE — Telephone Encounter (Signed)
Patient dropped off document Handicap Placard, to be filled out by provider. Patient requested to send it back via Call Patient to pick up within 5-days. Document is located in providers folder. Please advise at Mobile [phone] (mobile)

## 2023-07-28 NOTE — Telephone Encounter (Signed)
Called and notified patient that form has been completed, signed, and is ready to be picked up.  While on the phone, the patient states that his wife just came home and was diagnosed with the flu. Patient states that he is not feeling well and wants to know if he can have the Tamiflu sent in for him as well. Advised patient that he may need an appointment for evaluation first. Patient verbalized understanding of this.

## 2023-07-28 NOTE — Telephone Encounter (Signed)
Paperwork started and given to provider to sign.

## 2023-07-28 NOTE — Telephone Encounter (Signed)
Please call and schedule a virtual visit per Jolene.

## 2023-07-28 NOTE — Telephone Encounter (Signed)
Appt has been scheduled.

## 2023-07-29 ENCOUNTER — Telehealth: Payer: Medicare HMO | Admitting: Nurse Practitioner

## 2023-07-29 DIAGNOSIS — J101 Influenza due to other identified influenza virus with other respiratory manifestations: Secondary | ICD-10-CM | POA: Diagnosis not present

## 2023-07-29 DIAGNOSIS — Z03818 Encounter for observation for suspected exposure to other biological agents ruled out: Secondary | ICD-10-CM | POA: Diagnosis not present

## 2023-07-29 DIAGNOSIS — R509 Fever, unspecified: Secondary | ICD-10-CM | POA: Diagnosis not present

## 2023-07-29 DIAGNOSIS — R112 Nausea with vomiting, unspecified: Secondary | ICD-10-CM | POA: Diagnosis not present

## 2023-08-01 ENCOUNTER — Ambulatory Visit: Payer: Medicare HMO | Admitting: Urology

## 2023-08-05 ENCOUNTER — Emergency Department
Admission: EM | Admit: 2023-08-05 | Discharge: 2023-08-05 | Disposition: A | Discharge status: Home / Self Care | Payer: Medicare HMO | Attending: Emergency Medicine | Admitting: Emergency Medicine

## 2023-08-05 ENCOUNTER — Emergency Department: Payer: Medicare HMO

## 2023-08-05 ENCOUNTER — Other Ambulatory Visit: Payer: Self-pay

## 2023-08-05 DIAGNOSIS — R0602 Shortness of breath: Secondary | ICD-10-CM | POA: Diagnosis not present

## 2023-08-05 DIAGNOSIS — R55 Syncope and collapse: Secondary | ICD-10-CM | POA: Insufficient documentation

## 2023-08-05 DIAGNOSIS — R42 Dizziness and giddiness: Secondary | ICD-10-CM | POA: Diagnosis not present

## 2023-08-05 DIAGNOSIS — I428 Other cardiomyopathies: Secondary | ICD-10-CM | POA: Insufficient documentation

## 2023-08-05 DIAGNOSIS — R059 Cough, unspecified: Secondary | ICD-10-CM | POA: Diagnosis not present

## 2023-08-05 DIAGNOSIS — R531 Weakness: Secondary | ICD-10-CM | POA: Diagnosis not present

## 2023-08-05 DIAGNOSIS — R002 Palpitations: Secondary | ICD-10-CM | POA: Diagnosis not present

## 2023-08-05 DIAGNOSIS — I10 Essential (primary) hypertension: Secondary | ICD-10-CM | POA: Insufficient documentation

## 2023-08-05 DIAGNOSIS — W19XXXA Unspecified fall, initial encounter: Secondary | ICD-10-CM | POA: Diagnosis not present

## 2023-08-05 LAB — BASIC METABOLIC PANEL
Anion gap: 11 (ref 5–15)
BUN: 16 mg/dL (ref 8–23)
CO2: 23 mmol/L (ref 22–32)
Calcium: 8.5 mg/dL — ABNORMAL LOW (ref 8.9–10.3)
Chloride: 100 mmol/L (ref 98–111)
Creatinine, Ser: 0.8 mg/dL (ref 0.61–1.24)
GFR, Estimated: >60 mL/min (ref >60)
Glucose, Bld: 100 mg/dL — ABNORMAL HIGH (ref 70–99)
Potassium: 4.4 mmol/L (ref 3.5–5.1)
Sodium: 134 mmol/L — ABNORMAL LOW (ref 135–145)

## 2023-08-05 LAB — CBC
HCT: 37.9 % — ABNORMAL LOW (ref 39.0–52.0)
Hemoglobin: 13.4 g/dL (ref 13.0–17.0)
MCH: 32.3 pg (ref 26.0–34.0)
MCHC: 35.4 g/dL (ref 30.0–36.0)
MCV: 91.3 fL (ref 80.0–100.0)
Platelets: 193 10*3/uL (ref 150–400)
RBC: 4.15 MIL/uL — ABNORMAL LOW (ref 4.22–5.81)
RDW: 12.1 % (ref 11.5–15.5)
WBC: 5.2 10*3/uL (ref 4.0–10.5)
nRBC: 0 % (ref 0.0–0.2)

## 2023-08-05 LAB — BRAIN NATRIURETIC PEPTIDE: B Natriuretic Peptide: 25.1 pg/mL (ref 0.0–100.0)

## 2023-08-05 LAB — TROPONIN I (HIGH SENSITIVITY)
Troponin I (High Sensitivity): 12 ng/L (ref <18)
Troponin I (High Sensitivity): 12 ng/L (ref <18)

## 2023-08-05 MED ORDER — SODIUM CHLORIDE 0.9 % IV BOLUS
1000.0000 mL | Freq: Once | INTRAVENOUS | Status: AC
  Administered 2023-08-05: 1000 mL via INTRAVENOUS

## 2023-08-05 NOTE — ED Triage Notes (Addendum)
Pt comes via EMs from home with c/o syncopal episode. Wife report pt passed out. Pt states some sob and some wheezing per EMS. Pt was 100% RA  Pt recently got over the flu week ago.   Pt has IV in right arm

## 2023-08-05 NOTE — ED Triage Notes (Signed)
Pt to ED for syncopal episode this AM. Pt had flu last week. Pt has some sort of heart monitor that has been in place for "4 years" but is not a pacemaker. Pt in NAD. EKG shown to EDP. Pt endorses SOB.  Skin dry, NAD.

## 2023-08-05 NOTE — ED Provider Notes (Signed)
 [facility] Provider Note    Event Date/Time   First MD Initiated Contact with Patient 08/05/23 1221     (approximate)   History   Loss of Consciousness   HPI Jesus Price is a 63 y.o. male with history of Wolf Parkinson White syndrome, nonischemic cardiomyopathy, HTN presenting today for syncope.  Patient states he was diagnosed with the flu 1 week ago.  He has completed treatment but still felt dehydrated since then.  Very little p.o. intake.  Today he states he was up walking around when he all of a sudden had a brief incident where he felt like he could not breathe and then syncopized.  This was witnessed by his wife.  Took a minute to fully wake back up and be oriented.  No witnessed seizure activity.  Denied palpitations, chest pain, headache, nausea, vomiting, abdominal pain, leg pain, leg swelling.  States he feels fine at this time.  Patient states he intermittently has palpitations at baseline which is not abnormal for him but that this was different today.     Physical Exam   Triage Vital Signs: ED Triage Vitals  Encounter Vitals Group     BP 08/05/23 0915 127/72     Systolic BP Percentile --      Diastolic BP Percentile --      Pulse Rate 08/05/23 0915 81     Resp 08/05/23 0915 20     Temp 08/05/23 0915 97.9 F (36.6 C)     Temp Source 08/05/23 0915 Oral     SpO2 08/05/23 0915 96 %     Weight 08/05/23 0915 111 lb (50.3 kg)     Height 08/05/23 0915 5' 7 (1.702 m)     Head Circumference --      Peak Flow --      Pain Score 08/05/23 0916 0     Pain Loc --      Pain Education --      Exclude from Growth Chart --     Most recent vital signs: Vitals:   08/05/23 1406 08/05/23 1408  BP: 126/80 116/76  Pulse: (!) 59 62  Resp: 20   Temp: 97.7 F (36.5 C)   SpO2: 96%    Physical Exam: I have reviewed the vital signs and nursing notes. General: Awake, alert, no acute distress.  Nontoxic appearing. Head:  Atraumatic,  normocephalic.   ENT:  EOM intact, PERRL. Oral mucosa is pink and moist with no lesions. Neck: Neck is supple with full range of motion, No meningeal signs. Cardiovascular:  RRR, No murmurs. Peripheral pulses palpable and equal bilaterally. Respiratory:  Symmetrical chest wall expansion.  No rhonchi, rales, or wheezes.  Good air movement throughout.  No use of accessory muscles.   Musculoskeletal:  No cyanosis or edema. Moving extremities with full ROM Abdomen:  Soft, nontender, nondistended. Neuro:  GCS 15, moving all four extremities, interacting appropriately. Speech clear. Psych:  Calm, appropriate.   Skin:  Warm, dry, no rash.    ED Results / Procedures / Treatments   Labs (all labs ordered are listed, but only abnormal results are displayed) Labs Reviewed  BASIC METABOLIC PANEL - Abnormal; Notable for the following components:      Result Value   Sodium 134 (*)    Glucose, Bld 100 (*)    Calcium  8.5 (*)    All other components within normal limits  CBC - Abnormal; Notable for the following components:   RBC 4.15 (*)  HCT 37.9 (*)    All other components within normal limits  BRAIN NATRIURETIC PEPTIDE  CBG MONITORING, ED  TROPONIN I (HIGH SENSITIVITY)  TROPONIN I (HIGH SENSITIVITY)     EKG My EKG interpretation: Rate of 72, normal sinus rhythm.  Normal axis.  No acute ST elevations or depressions.  No significantly obvious delta wave present.   RADIOLOGY Independently interpreted chest x-ray with no acute pathology   PROCEDURES:  Critical Care performed: No  Procedures   MEDICATIONS ORDERED IN ED: Medications  sodium chloride  0.9 % bolus 1,000 mL (0 mLs Intravenous Stopped 08/05/23 1342)     IMPRESSION / MDM / ASSESSMENT AND PLAN / ED COURSE  I reviewed the triage vital signs and the nursing notes.                              Differential diagnosis includes, but is not limited to, vasovagal syncope, orthostatic hypotension, cardiac arrhythmia,  electrolyte abnormality  Patient's presentation is most consistent with acute presentation with potential threat to life or bodily function.  Patient is a 63 year old male presenting today for syncopal episode at home with prior cardiac history including Wolff-Parkinson-White and nonischemic cardiomyopathy.  On arrival here, vital signs are stable and patient is rather asymptomatic.  EKG reassuring at this time.  Troponins negative x 2 for acute coronary syndrome.  BNP normal.  CBC and BMP largely unremarkable.  Chest x-ray normal.  Given his cardiac history, did discuss case with cardiology with Dr. Mady.  They evaluated his loop recorder and found no acute cardiac arrhythmias concerning for his syncopal episode today.  They do feel he is safe for discharge and outpatient follow-up with the cardiology team.  Patient felt better after receiving 1 L of fluids and ambulated without difficulty.  Will discharge at this time with strict return precautions and outpatient cardiology follow-up.  The patient is on the cardiac monitor to evaluate for evidence of arrhythmia and/or significant heart rate changes. Clinical Course as of 08/05/23 1504  Tue Aug 05, 2023  1413 Dr. Mady - will discuss with EP team. Will discuss interrogation of loop recorder first with them. If that is completely normal, could send home and follow up with EP team [DW]    Clinical Course User Index [DW] Malvina Alm DASEN, MD     FINAL CLINICAL IMPRESSION(S) / ED DIAGNOSES   Final diagnoses:  Syncope and collapse     Rx / DC Orders   ED Discharge Orders     None        Note:  This document was prepared using Dragon voice recognition software and may include unintentional dictation errors.   Malvina Alm DASEN, MD 08/05/23 [phone]

## 2023-08-05 NOTE — Discharge Instructions (Signed)
Please follow-up with your cardiology team in the near future for reassessment.  Make sure you are hydrating well over the next several days.  Please take it easy when getting up and walking around.  Please return for any more severe symptoms.

## 2023-08-05 NOTE — ED Notes (Signed)
See triage notes. Patient c/o syncopal episode this morning. Patient had the flu last week.

## 2023-08-06 ENCOUNTER — Telehealth: Payer: Self-pay

## 2023-08-06 NOTE — Telephone Encounter (Signed)
 Noted.

## 2023-08-06 NOTE — Transitions of Care (Post Inpatient/ED Visit) (Signed)
 08/06/2023  Name: Jesus Price MRN: [id number] DOB: [birth year]  Today's TOC FU Call Status: Today's TOC FU Call Status:: Successful TOC FU Call Completed TOC FU Call Complete Date: 08/06/23 Patient's Name and Date of Birth confirmed.  Transition Care Management Follow-up Telephone Call Date of Discharge: 08/05/23 Discharge Facility: [facility]) Type of Discharge: Emergency Department Reason for ED Visit: Other: (influenza) How have you been since you were released from the hospital?: Better Any questions or concerns?: No  Items Reviewed: Did you receive and understand the discharge instructions provided?: Yes Medications obtained,verified, and reconciled?: Yes (Medications Reviewed) Any new allergies since your discharge?: No Dietary orders reviewed?: Yes Do you have support at home?: Yes People in Home: spouse  Medications Reviewed Today: Medications Reviewed Today     Reviewed by Emmitt Pan, LPN (Licensed Practical Nurse) on 08/06/23 at 1206  Med List Status: <None>   Medication Order Taking? Sig Documenting Provider Last Dose Status Informant  aspirin  EC 81 MG tablet [id number] No Take 1 tablet (81 mg total) by mouth daily. Swallow whole. Valerio Moris T, NP Taking Active   CARDIZEM  CD 180 MG 24 hr capsule [id number] No Take 1 capsule (180 mg total) by mouth daily. Valerio Moris T, NP Taking Active   carvedilol  (COREG ) 6.25 MG tablet [id number] No Take 1 tablet (6.25 mg total) by mouth 2 (two) times daily. Riddle, Suzann, NP Taking Active   COSOPT 2-0.5 % ophthalmic solution [id number] No 1 drop 2 (two) times daily. [provider] Taking Active   fluticasone (FLONASE) 50 MCG/ACT nasal spray [id number] No Place 1 spray into both nostrils daily. [provider] Taking Active   ibuprofen  (ADVIL ) 600 MG tablet [id number] No Take 1 tablet (600 mg total) by mouth every 6 (six) hours as needed. Corlis Burnard DEL, NP Taking Active    latanoprost (XALATAN) 0.005 % ophthalmic solution [id number] No Place 1 drop into both eyes at bedtime. [provider] Taking Active   losartan  (COZAAR ) 100 MG tablet [id number]  TAKE 1 TABLET EVERY DAY Cannady, Jolene T, NP  Active   meloxicam  (MOBIC ) 15 MG tablet [id number] No Take 1 tablet (15 mg total) by mouth daily. Cannady, Jolene T, NP Taking Active   methocarbamol  (ROBAXIN ) 500 MG tablet [id number] No Take 1 tablet (500 mg total) by mouth every 6 (six) hours as needed for muscle spasms. Cannady, Jolene T, NP Taking Active   rosuvastatin  (CRESTOR ) 10 MG tablet [id number]  Take 1 tablet (10 mg total) by mouth daily. Cannady, Jolene T, NP  Active   sildenafil  (VIAGRA ) 100 MG tablet [id number] No Take 1 tab 1 hour  prior to incourse Stoioff, Glendia BROCKS, MD Taking Active   silver  sulfADIAZINE  (SILVADENE ) 1 % cream [id number] No Apply 1 Application topically daily. Janit Thresa HERO, DPM Taking Active   tamsulosin  (FLOMAX ) 0.4 MG CAPS capsule [id number] No Take 2 tablets once a day Stoioff, Glendia BROCKS, MD Taking Active             Home Care and Equipment/Supplies: Were Home Health Services Ordered?: NA Any new equipment or medical supplies ordered?: NA  Functional Questionnaire: Do you need assistance with bathing/showering or dressing?: No Do you need assistance with meal preparation?: No Do you need assistance with eating?: No Do you have difficulty maintaining continence: No Do you need assistance with getting out of bed/getting out of a chair/moving?: No Do you have difficulty managing or taking your medications?: No  Follow up appointments reviewed:  PCP Follow-up appointment confirmed?: No (no avail appt, sent message to staff to schedule) MD Provider Line Number:[phone] Given: No Specialist Hospital Follow-up appointment confirmed?: NA Do you need transportation to your follow-up appointment?: No Do you understand care options if your condition(s) worsen?: Yes-patient  verbalized understanding    SIGNATURE Julian Lemmings, LPN [facility] Nurse Health Advisor Direct Dial [phone]

## 2023-08-10 NOTE — Patient Instructions (Addendum)
[facility] --  Address: [address], [place], [place] [address] Hours:  Open ? Closes 5?PM Phone: [phone]  Healthy Eating, Adult Healthy eating may help you get and keep a healthy body weight, reduce the risk of chronic disease, and live a long and productive life. It is important to follow a healthy eating pattern. Your nutritional and calorie needs should be met mainly by different nutrient-rich foods. What are tips for following this plan? Reading food labels Read labels and choose the following: Reduced or low sodium products. Juices with 100% fruit juice. Foods with low saturated fats (<3 g per serving) and high polyunsaturated and monounsaturated fats. Foods with whole grains, such as whole wheat, cracked wheat, brown rice, and wild rice. Whole grains that are fortified with folic acid. This is recommended for females who are pregnant or who want to become pregnant. Read labels and do not eat or drink the following: Foods or drinks with added sugars. These include foods that contain brown sugar, corn sweetener, corn syrup, dextrose, fructose, glucose, high-fructose corn syrup, honey, invert sugar, lactose, malt syrup, maltose, molasses, raw sugar, sucrose, trehalose, or turbinado sugar. Limit your intake of added sugars to less than 10% of your total daily calories. Do not eat more than the following amounts of added sugar per day: 6 teaspoons (25 g) for females. 9 teaspoons (38 g) for males. Foods that contain processed or refined starches and grains. Refined grain products, such as white flour, degermed cornmeal, white bread, and white rice. Shopping Choose nutrient-rich snacks, such as vegetables, whole fruits, and nuts. Avoid high-calorie and high-sugar snacks, such as potato chips, fruit snacks, and candy. Use oil-based dressings and spreads on foods instead of solid fats such as butter, margarine, sour cream, or cream cheese. Limit pre-made sauces,  mixes, and "instant" products such as flavored rice, instant noodles, and ready-made pasta. Try more plant-protein sources, such as tofu, tempeh, black beans, edamame, lentils, nuts, and seeds. Explore eating plans such as the Mediterranean diet or vegetarian diet. Try heart-healthy dips made with beans and healthy fats like hummus and guacamole. Vegetables go great with these. Cooking Use oil to saut or stir-fry foods instead of solid fats such as butter, margarine, or lard. Try baking, boiling, grilling, or broiling instead of frying. Remove the fatty part of meats before cooking. Steam vegetables in water or broth. Meal planning  At meals, imagine dividing your plate into fourths: One-half of your plate is fruits and vegetables. One-fourth of your plate is whole grains. One-fourth of your plate is protein, especially lean meats, poultry, eggs, tofu, beans, or nuts. Include low-fat dairy as part of your daily diet. Lifestyle Choose healthy options in all settings, including home, work, school, restaurants, or stores. Prepare your food safely: Wash your hands after handling raw meats. Where you prepare food, keep surfaces clean by regularly washing with hot, soapy water. Keep raw meats separate from ready-to-eat foods, such as fruits and vegetables. Cook seafood, meat, poultry, and eggs to the recommended temperature. Get a food thermometer. Store foods at safe temperatures. In general: Keep cold foods at 73F (4.4C) or below. Keep hot foods at 173F (60C) or above. Keep your freezer at [facility] (-17.8C) or below. Foods are not safe to eat if they have been between the temperatures of 40-173F (4.4-60C) for more than 2 hours. What foods should I eat? Fruits Aim to eat 1-2 cups of fresh, canned (in natural juice), or frozen fruits each day. One cup  of fruit equals 1 small apple, 1 large banana, 8 large strawberries, 1 cup (237 g) canned fruit,  cup (82 g) dried fruit, or 1 cup (240  mL) 100% juice. Vegetables Aim to eat 2-4 cups of fresh and frozen vegetables each day, including different varieties and colors. One cup of vegetables equals 1 cup (91 g) broccoli or cauliflower florets, 2 medium carrots, 2 cups (150 g) raw, leafy greens, 1 large tomato, 1 large bell pepper, 1 large sweet potato, or 1 medium white potato. Grains Aim to eat 5-10 ounce-equivalents of whole grains each day. Examples of 1 ounce-equivalent of grains include 1 slice of bread, 1 cup (40 g) ready-to-eat cereal, 3 cups (24 g) popcorn, or  cup (93 g) cooked rice. Meats and other proteins Try to eat 5-7 ounce-equivalents of protein each day. Examples of 1 ounce-equivalent of protein include 1 egg,  oz nuts (12 almonds, 24 pistachios, or 7 walnut halves), 1/4 cup (90 g) cooked beans, 6 tablespoons (90 g) hummus or 1 tablespoon (16 g) peanut butter. A cut of meat or fish that is the size of a deck of cards is about 3-4 ounce-equivalents (85 g). Of the protein you eat each week, try to have at least 8 sounce (227 g) of seafood. This is about 2 servings per week. This includes salmon, trout, herring, sardines, and anchovies. Dairy Aim to eat 3 cup-equivalents of fat-free or low-fat dairy each day. Examples of 1 cup-equivalent of dairy include 1 cup (240 mL) milk, 8 ounces (250 g) yogurt, 1 ounces (44 g) natural cheese, or 1 cup (240 mL) fortified soy milk. Fats and oils Aim for about 5 teaspoons (21 g) of fats and oils per day. Choose monounsaturated fats, such as canola and olive oils, mayonnaise made with olive oil or avocado oil, avocados, peanut butter, and most nuts, or polyunsaturated fats, such as sunflower, corn, and soybean oils, walnuts, pine nuts, sesame seeds, sunflower seeds, and flaxseed. Beverages Aim for 6 eight-ounce glasses of water per day. Limit coffee to 3-5 eight-ounce cups per day. Limit caffeinated beverages that have added calories, such as soda and energy drinks. If you drink  alcohol: Limit how much you have to: 0-1 drink a day if you are male. 0-2 drinks a day if you are male. Know how much alcohol is in your drink. In the [place] one drink is one 12 oz bottle of beer (355 mL), one 5 oz glass of wine (148 mL), or one 1 oz glass of hard liquor (44 mL). Seasoning and other foods Try not to add too much salt to your food. Try using herbs and spices instead of salt. Try not to add sugar to food. This information is based on [place] nutrition guidelines. To learn more, visit DisposableNylon.be. Exact amounts may vary. You may need different amounts. This information is not intended to replace advice given to you by your health care provider. Make sure you discuss any questions you have with your health care provider. Document Revised: 03/18/2022 Document Reviewed: 03/18/2022 Elsevier Patient Education  2024 [employer].

## 2023-08-11 ENCOUNTER — Ambulatory Visit (INDEPENDENT_AMBULATORY_CARE_PROVIDER_SITE_OTHER): Payer: Medicare HMO

## 2023-08-11 DIAGNOSIS — R002 Palpitations: Secondary | ICD-10-CM

## 2023-08-11 LAB — CUP PACEART REMOTE DEVICE CHECK
Date Time Interrogation Session: 20250209231210
Implantable Pulse Generator Implant Date: 20221228

## 2023-08-13 ENCOUNTER — Ambulatory Visit (INDEPENDENT_AMBULATORY_CARE_PROVIDER_SITE_OTHER): Payer: Medicare HMO | Admitting: Nurse Practitioner

## 2023-08-13 ENCOUNTER — Encounter: Payer: Self-pay | Admitting: Nurse Practitioner

## 2023-08-13 VITALS — BP 111/61 | HR 63 | Temp 97.9°F | Ht 67.0 in | Wt 116.2 lb

## 2023-08-13 DIAGNOSIS — I428 Other cardiomyopathies: Secondary | ICD-10-CM

## 2023-08-13 DIAGNOSIS — R42 Dizziness and giddiness: Secondary | ICD-10-CM

## 2023-08-13 DIAGNOSIS — R002 Palpitations: Secondary | ICD-10-CM | POA: Diagnosis not present

## 2023-08-13 DIAGNOSIS — E782 Mixed hyperlipidemia: Secondary | ICD-10-CM

## 2023-08-13 DIAGNOSIS — I10 Essential (primary) hypertension: Secondary | ICD-10-CM

## 2023-08-13 DIAGNOSIS — I456 Pre-excitation syndrome: Secondary | ICD-10-CM

## 2023-08-13 NOTE — Assessment & Plan Note (Signed)
Chronic, ongoing.  Continue Rosuvastatin daily and adjust as needed.  Lipid panel today.

## 2023-08-13 NOTE — Assessment & Plan Note (Addendum)
Chronic, stable.  BP well below goal today.  Recommend he monitor BP at least a few mornings a week at home and document.  DASH diet at home.  Continue current medication regimen and adjust as needed.  Labs today: BMP and CBC.  Urine ALB 80 March 2024, continue Losartan for kidney protection.

## 2023-08-13 NOTE — Assessment & Plan Note (Signed)
Chronic, ongoing.  Continue collaboration with cardiology.  Recent notes reviewed.  Provided number for him to return to cardiology, recommend he schedule ASAP.

## 2023-08-13 NOTE — Assessment & Plan Note (Signed)
Chronic, stable. Continue to follow with cardiology and medication regimen as prescribed by them. Provided number for him to return to cardiology, recommend he schedule ASAP.

## 2023-08-13 NOTE — Assessment & Plan Note (Signed)
Acute with one episode during time he had flu.  Overall reports feeling better with no further episodes.  Labs today.

## 2023-08-13 NOTE — Assessment & Plan Note (Signed)
Ongoing, followed by cardiology, continue this collaboration.  Provided number for him to return to cardiology, recommend he schedule ASAP.

## 2023-08-13 NOTE — Progress Notes (Signed)
BP 111/61   Pulse 63   Temp 97.9 F (36.6 C) (Oral)   Ht 5\' 7"  (1.702 m)   Wt 116 lb 3.2 oz (52.7 kg)   SpO2 98%   BMI 18.20 kg/m    Subjective:    Patient ID: Jesus Price, male    DOB: 22-May-1961, 63 y.o.   MRN: [id number]  HPI: Jesus Price is a 63 y.o. male  Chief Complaint  Patient presents with   ER Follow Up   ER FOLLOW UP Follow-up today for ER visit on 08/05/23, had influenza A on 07/29/23 and was still not feeling good on 08/05/23.  Was seen in ER for syncopal episode.  Had been walking around house and felt off balance when went to stand up.  Last saw cardiology on 11/29/22 for palpitations and is going to schedule follow-up.  He reports feeling better today and no more syncopal episodes. Time since discharge: 8 days Hospital/facility: [facility] Diagnosis: syncope Procedures/tests: blood work and chest x-ray Consultants: none New medications: none Discharge instructions:  Follow-up with PCP Status: better   Relevant past medical, surgical, family and social history reviewed and updated as indicated. Interim medical history since our last visit reviewed. Allergies and medications reviewed and updated.  Review of Systems  Constitutional:  Negative for activity change, diaphoresis, fatigue and fever.  Respiratory:  Negative for cough, chest tightness, shortness of breath and wheezing.   Cardiovascular:  Negative for chest pain, palpitations and leg swelling.  Gastrointestinal: Negative.   Neurological: Negative.   Psychiatric/Behavioral:  Negative for decreased concentration, self-injury, sleep disturbance and suicidal ideas. The patient is not nervous/anxious.    Per HPI unless specifically indicated above     Objective:    BP 111/61   Pulse 63   Temp 97.9 F (36.6 C) (Oral)   Ht 5\' 7"  (1.702 m)   Wt 116 lb 3.2 oz (52.7 kg)   SpO2 98%   BMI 18.20 kg/m   Wt Readings from Last 3 Encounters:  08/13/23 116 lb 3.2 oz (52.7 kg)  08/05/23 111 lb (50.3  kg)  03/28/23 121 lb 3.2 oz (55 kg)    Physical Exam Vitals and nursing note reviewed.  Constitutional:      General: He is awake. He is not in acute distress.    Appearance: Normal appearance. He is well-developed, well-groomed and underweight. He is not ill-appearing or toxic-appearing.  HENT:     Head: Normocephalic.     Right Ear: Hearing and external ear normal.     Left Ear: Hearing and external ear normal.  Eyes:     General: Lids are normal.     Extraocular Movements: Extraocular movements intact.     Conjunctiva/sclera: Conjunctivae normal.  Neck:     Thyroid: No thyromegaly.     Vascular: No carotid bruit.  Cardiovascular:     Rate and Rhythm: Normal rate and regular rhythm.     Heart sounds: Normal heart sounds. No murmur heard.    No gallop.  Pulmonary:     Effort: No accessory muscle usage or respiratory distress.     Breath sounds: Normal breath sounds.  Abdominal:     General: Bowel sounds are normal. There is no distension.     Palpations: Abdomen is soft.     Tenderness: There is no abdominal tenderness.  Musculoskeletal:     Cervical back: Full passive range of motion without pain.     Right lower leg: No edema.  Left lower leg: No edema.  Lymphadenopathy:     Cervical: No cervical adenopathy.  Skin:    General: Skin is warm.     Capillary Refill: Capillary refill takes less than 2 seconds.  Neurological:     Mental Status: He is alert and oriented to person, place, and time.     Cranial Nerves: Cranial nerves 2-12 are intact.     Motor: Motor function is intact.     Coordination: Coordination is intact.     Gait: Gait is intact.     Deep Tendon Reflexes: Reflexes are normal and symmetric.     Reflex Scores:      Brachioradialis reflexes are 2+ on the right side and 2+ on the left side.      Patellar reflexes are 2+ on the right side and 2+ on the left side. Psychiatric:        Attention and Perception: Attention normal.        Mood and Affect:  Mood normal.        Speech: Speech normal.        Behavior: Behavior normal. Behavior is cooperative.        Thought Content: Thought content normal.    Results for orders placed or performed in visit on 08/11/23  CUP PACEART REMOTE DEVICE CHECK   Collection Time: 08/10/23 11:12 PM  Result Value Ref Range   Date Time Interrogation Session [id number]    Pulse Generator Manufacturer MERM    Pulse Gen Model LNQ22 LINQ II    Pulse Gen Serial Number [id number]    Clinic Name [facility]    Implantable Pulse Generator Type ICM/ILR    Implantable Pulse Generator Implant Date [id number]    Eval Rhythm SR at 80 bpm       Assessment & Plan:   Problem List Items Addressed This Visit       Cardiovascular and Mediastinum   Essential hypertension   Chronic, stable.  BP well below goal today.  Recommend he monitor BP at least a few mornings a week at home and document.  DASH diet at home.  Continue current medication regimen and adjust as needed.  Labs today: BMP and CBC.  Urine ALB 80 March 2024, continue Losartan for kidney protection.        Relevant Orders   Basic metabolic panel   CBC with Differential/Platelet   TSH   NICM (nonischemic cardiomyopathy) (HCC) - Primary   Chronic, ongoing.  Continue collaboration with cardiology.  Recent notes reviewed.  Provided number for him to return to cardiology, recommend he schedule ASAP.      Wolff-Parkinson-White (WPW) syndrome   Chronic, stable. Continue to follow with cardiology and medication regimen as prescribed by them. Provided number for him to return to cardiology, recommend he schedule ASAP.        Other   Mixed hyperlipidemia   Chronic, ongoing.  Continue Rosuvastatin daily and adjust as needed.  Lipid panel today.      Relevant Orders   Basic metabolic panel   Lipid Panel w/o Chol/HDL Ratio   Palpitations   Ongoing, followed by cardiology, continue this collaboration.  Provided number for him to return to cardiology,  recommend he schedule ASAP.      Vertigo   Acute with one episode during time he had flu.  Overall reports feeling better with no further episodes.  Labs today.        Follow up plan: Return for as scheduled March 28th.

## 2023-08-14 ENCOUNTER — Encounter: Payer: Self-pay | Admitting: Nurse Practitioner

## 2023-08-14 ENCOUNTER — Encounter: Payer: Self-pay | Admitting: Cardiology

## 2023-08-14 ENCOUNTER — Other Ambulatory Visit: Payer: Self-pay | Admitting: Nurse Practitioner

## 2023-08-14 DIAGNOSIS — R7989 Other specified abnormal findings of blood chemistry: Secondary | ICD-10-CM

## 2023-08-14 LAB — LIPID PANEL W/O CHOL/HDL RATIO
Cholesterol, Total: 102 mg/dL (ref 100–199)
HDL: 47 mg/dL (ref >39)
LDL Chol Calc (NIH): 44 mg/dL (ref 0–99)
Triglycerides: 42 mg/dL (ref 0–149)
VLDL Cholesterol Cal: 11 mg/dL (ref 5–40)

## 2023-08-14 LAB — CBC WITH DIFFERENTIAL/PLATELET
Basophils Absolute: 0 10*3/uL (ref 0.0–0.2)
Basos: 0 %
EOS (ABSOLUTE): 0.1 10*3/uL (ref 0.0–0.4)
Eos: 2 %
Hematocrit: 36.1 % — ABNORMAL LOW (ref 37.5–51.0)
Hemoglobin: 12.8 g/dL — ABNORMAL LOW (ref 13.0–17.7)
Immature Grans (Abs): 0 10*3/uL (ref 0.0–0.1)
Immature Granulocytes: 0 %
Lymphocytes Absolute: 2.8 10*3/uL (ref 0.7–3.1)
Lymphs: 47 %
MCH: 33.2 pg — ABNORMAL HIGH (ref 26.6–33.0)
MCHC: 35.5 g/dL (ref 31.5–35.7)
MCV: 94 fL (ref 79–97)
Monocytes Absolute: 0.4 10*3/uL (ref 0.1–0.9)
Monocytes: 7 %
Neutrophils Absolute: 2.7 10*3/uL (ref 1.4–7.0)
Neutrophils: 44 %
Platelets: 292 10*3/uL (ref 150–450)
RBC: 3.86 x10E6/uL — ABNORMAL LOW (ref 4.14–5.80)
RDW: 12.6 % (ref 11.6–15.4)
WBC: 6 10*3/uL (ref 3.4–10.8)

## 2023-08-14 LAB — BASIC METABOLIC PANEL
BUN/Creatinine Ratio: 20 (ref 10–24)
BUN: 21 mg/dL (ref 8–27)
CO2: 22 mmol/L (ref 20–29)
Calcium: 9.1 mg/dL (ref 8.6–10.2)
Chloride: 104 mmol/L (ref 96–106)
Creatinine, Ser: 1.04 mg/dL (ref 0.76–1.27)
Glucose: 81 mg/dL (ref 70–99)
Potassium: 4.7 mmol/L (ref 3.5–5.2)
Sodium: 141 mmol/L (ref 134–144)
eGFR: 80 mL/min/{1.73_m2} (ref >59)

## 2023-08-14 LAB — TSH: TSH: 1.35 u[IU]/mL (ref 0.450–4.500)

## 2023-08-14 NOTE — Progress Notes (Signed)
Contacted via MyChart -- lab only visit in 4 weeks please   Good evening Mosi, your labs have returned: - CBC is showing slightly low hemoglobin and hematocrit -- possible mild anemia.  I would like to recheck this outpatient in 4 weeks please.  My staff will call to schedule lab only visit. - Remainder of labs are stable, no medication changes needed.  Any questions? Keep being stellar!!  Thank you for allowing me to participate in your care.  I appreciate you. Kindest regards, Jaloni Davoli

## 2023-08-18 NOTE — Addendum Note (Signed)
Addended by: Geralyn Flash D on: 08/18/2023 04:39 PM   Modules accepted: Orders

## 2023-08-18 NOTE — Progress Notes (Signed)
 Carelink Summary Report / Loop Recorder

## 2023-08-22 ENCOUNTER — Ambulatory Visit (INDEPENDENT_AMBULATORY_CARE_PROVIDER_SITE_OTHER): Payer: Medicare HMO | Admitting: Urology

## 2023-08-22 VITALS — BP 115/64 | HR 69 | Ht 67.0 in | Wt 117.0 lb

## 2023-08-22 DIAGNOSIS — R102 Pelvic and perineal pain: Secondary | ICD-10-CM | POA: Diagnosis not present

## 2023-08-22 DIAGNOSIS — R3129 Other microscopic hematuria: Secondary | ICD-10-CM

## 2023-08-22 DIAGNOSIS — N401 Enlarged prostate with lower urinary tract symptoms: Secondary | ICD-10-CM

## 2023-08-22 LAB — URINALYSIS, COMPLETE
Bilirubin, UA: NEGATIVE
Glucose, UA: NEGATIVE
Ketones, UA: NEGATIVE
Leukocytes,UA: NEGATIVE
Nitrite, UA: NEGATIVE
Protein,UA: NEGATIVE
RBC, UA: NEGATIVE
Specific Gravity, UA: 1.015 (ref 1.005–1.030)
Urobilinogen, Ur: 0.2 mg/dL (ref 0.2–1.0)
pH, UA: 7 (ref 5.0–7.5)

## 2023-08-22 LAB — MICROSCOPIC EXAMINATION
Bacteria, UA: NONE SEEN
Epithelial Cells (non renal): NONE SEEN /[HPF] (ref 0–10)
RBC, Urine: NONE SEEN /[HPF] (ref 0–2)

## 2023-08-22 MED ORDER — TAMSULOSIN HCL 0.4 MG PO CAPS: ORAL_CAPSULE | ORAL | 11 refills | Status: DC

## 2023-08-22 MED ORDER — SILDENAFIL CITRATE 100 MG PO TABS: ORAL_TABLET | ORAL | 6 refills | Status: DC

## 2023-08-22 NOTE — Progress Notes (Signed)
 I, Jesus Price, acting as a scribe for Jesus Altes, MD., have documented all relevant documentation on the behalf of Jesus Altes, MD, as directed by Jesus Altes, MD while in the presence of Jesus Altes, MD.  08/22/2023 3:38 PM   Jesus Price 23-Nov-1960 [id number]  Referring provider: Marjie Skiff, NP [address] [place],  [place] [address]  Chief Complaint  Patient presents with   Hematuria   Urologic History: 1. Perineal pain Significant pelvic floor tenderness on exam,   2. BPH with a LUTS Tamsulosin 0.4 mg twice daily.   3. Microhematuria CT 11/2022 showed no upper tract abnormalities.  Cystoscopy with mild lateral lobe enlargement prostate and tight bladder neck.  HPI: Jesus Price is a 63 y.o. male presents for annual follow-up.  Has noted significant improvement in his lower urinary tract symptoms on tamsulosin and his pelvic perineal pain has improved.  Denies gross hematuria.   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 If you have any questions, ask your nurse or doctor.          aspirin EC 81 MG tablet Take 1 tablet (81 mg total) by mouth daily. Swallow whole.   Cardizem CD 180 MG 24 hr capsule Generic drug: diltiazem Take 1 capsule (180 mg total) by mouth daily.   carvedilol 6.25 MG tablet Commonly known as: COREG Take 1 tablet (6.25 mg total) by mouth 2 (two) times daily.   Cosopt 2-0.5 % ophthalmic solution Generic drug: dorzolamide-timolol 1 drop 2 (two) times daily.   fluticasone 50 MCG/ACT nasal spray Commonly known as: FLONASE Place 1 spray into both  nostrils daily.   ibuprofen 600 MG tablet Commonly known as: ADVIL Take 1 tablet (600 mg total) by mouth every 6 (six) hours as needed.   latanoprost 0.005 % ophthalmic solution Commonly known as: XALATAN Place 1 drop into both eyes at bedtime.   losartan 100 MG tablet Commonly known as: COZAAR TAKE 1 TABLET EVERY DAY   meloxicam 15 MG tablet Commonly known as: MOBIC Take 1 tablet (15 mg total) by mouth daily.   methocarbamol 500 MG tablet Commonly known as: ROBAXIN Take 1 tablet (500 mg total) by mouth every 6 (six) hours as needed for muscle spasms.   rosuvastatin 10 MG tablet Commonly known as: Crestor Take 1 tablet (10 mg total) by mouth daily.   sildenafil 100 MG tablet Commonly known as: Viagra Take 1 tab 1 hour  prior to incourse   silver sulfADIAZINE 1 % cream Commonly known as: Silvadene Apply 1 Application topically daily.   tamsulosin 0.4 MG Caps capsule Commonly known as: FLOMAX Take 2 tablets once a day        Allergies: No Known Allergies  Family History: Family History  Problem Relation Age of Onset   Heart disease Mother    Lupus Sister    Lupus Maternal Grandmother     Social History:  reports that he has been smoking cigarettes. He started smoking about 45 years ago.  He has a 11.3 pack-year smoking history. He has been exposed to tobacco smoke. He has never used smokeless tobacco. He reports that he does not drink alcohol and does not use drugs.   Physical Exam: BP 115/64   Pulse 69   Ht 5\' 7"  (1.702 m)   Wt 117 lb (53.1 kg)   BMI 18.32 kg/m   Constitutional:  Alert and oriented, No acute distress. HEENT: [place] AT Respiratory: Normal respiratory effort, no increased work of breathing. Psychiatric: Normal mood and affect.   Urinalysis Dipstick/microscopy negative   Assessment & Plan:    1. Perineal pain Improved on tamsulosin  We did discuss he may benefit from pelvic floor physical therapy and he was interested in a consult.    2. BPH with LUTS Continue tamsulosin  3. Microhematuria Previous negative evaluation Urinalysis today clear.  I have reviewed the above documentation for accuracy and completeness, and I agree with the above.   Jesus Altes, MD  [facility] [address] [place], [place] [address] [phone]

## 2023-08-24 ENCOUNTER — Encounter: Payer: Self-pay | Admitting: Urology

## 2023-09-15 ENCOUNTER — Other Ambulatory Visit: Payer: Medicare HMO

## 2023-09-15 ENCOUNTER — Ambulatory Visit (INDEPENDENT_AMBULATORY_CARE_PROVIDER_SITE_OTHER): Payer: Medicare HMO

## 2023-09-15 DIAGNOSIS — R002 Palpitations: Secondary | ICD-10-CM | POA: Diagnosis not present

## 2023-09-15 DIAGNOSIS — I428 Other cardiomyopathies: Secondary | ICD-10-CM

## 2023-09-16 LAB — CUP PACEART REMOTE DEVICE CHECK
Date Time Interrogation Session: 20250316231029
Implantable Pulse Generator Implant Date: 20221228

## 2023-09-19 ENCOUNTER — Other Ambulatory Visit

## 2023-09-19 DIAGNOSIS — R7989 Other specified abnormal findings of blood chemistry: Secondary | ICD-10-CM

## 2023-09-20 ENCOUNTER — Encounter: Payer: Self-pay | Admitting: Nurse Practitioner

## 2023-09-20 LAB — CBC WITH DIFFERENTIAL/PLATELET
Basophils Absolute: 0 10*3/uL (ref 0.0–0.2)
Basos: 1 %
EOS (ABSOLUTE): 0.1 10*3/uL (ref 0.0–0.4)
Eos: 1 %
Hematocrit: 40.4 % (ref 37.5–51.0)
Hemoglobin: 13.5 g/dL (ref 13.0–17.7)
Immature Grans (Abs): 0 10*3/uL (ref 0.0–0.1)
Immature Granulocytes: 0 %
Lymphocytes Absolute: 1.9 10*3/uL (ref 0.7–3.1)
Lymphs: 34 %
MCH: 32.5 pg (ref 26.6–33.0)
MCHC: 33.4 g/dL (ref 31.5–35.7)
MCV: 97 fL (ref 79–97)
Monocytes Absolute: 0.2 10*3/uL (ref 0.1–0.9)
Monocytes: 4 %
Neutrophils Absolute: 3.3 10*3/uL (ref 1.4–7.0)
Neutrophils: 60 %
Platelets: 197 10*3/uL (ref 150–450)
RBC: 4.15 x10E6/uL (ref 4.14–5.80)
RDW: 13.5 % (ref 11.6–15.4)
WBC: 5.5 10*3/uL (ref 3.4–10.8)

## 2023-09-20 LAB — IRON: Iron: 83 ug/dL (ref 38–169)

## 2023-09-20 LAB — FERRITIN: Ferritin: 140 ng/mL (ref 30–400)

## 2023-09-20 NOTE — Progress Notes (Signed)
Contacted via MyChart

## 2023-09-21 ENCOUNTER — Encounter: Payer: Self-pay | Admitting: Cardiology

## 2023-09-22 NOTE — Progress Notes (Signed)
 Carelink Summary Report / Loop Recorder

## 2023-09-26 ENCOUNTER — Encounter: Payer: Self-pay | Admitting: Nurse Practitioner

## 2023-09-27 NOTE — Patient Instructions (Signed)

## 2023-09-30 ENCOUNTER — Ambulatory Visit: Payer: Self-pay

## 2023-09-30 VITALS — Ht 67.0 in | Wt 118.0 lb

## 2023-09-30 DIAGNOSIS — Z0000 Encounter for general adult medical examination without abnormal findings: Secondary | ICD-10-CM | POA: Diagnosis not present

## 2023-09-30 DIAGNOSIS — F419 Anxiety disorder, unspecified: Secondary | ICD-10-CM

## 2023-09-30 NOTE — Progress Notes (Signed)
 Subjective:   Jesus Price is a 63 y.o. who presents for a Medicare Wellness preventive visit.  Visit Complete: Virtual I connected with  [name] on 09/30/23 by a audio enabled telemedicine application and verified that I am speaking with the correct person using two identifiers.  Patient Location: Home  Provider Location: Office/Clinic  I discussed the limitations of evaluation and management by telemedicine. The patient expressed understanding and agreed to proceed.  Vital Signs: Because this visit was a virtual/telehealth visit, some criteria may be missing or patient reported. Any vitals not documented were not able to be obtained and vitals that have been documented are patient reported.  VideoDeclined- This patient declined Librarian, academic. Therefore the visit was completed with audio only.  Persons Participating in Visit: Patient.  AWV Questionnaire: No: Patient Medicare AWV questionnaire was not completed prior to this visit.  Cardiac Risk Factors include: advanced age (>71men, >59 women);male gender;dyslipidemia;hypertension;smoking/ tobacco exposure     Objective:    Today's Vitals   09/30/23 1516  Weight: 118 lb (53.5 kg)  Height: 5\' 7"  (1.702 m)   Body mass index is 18.48 kg/m.     09/30/2023    3:32 PM 08/05/2023    9:17 AM 09/24/2022    2:33 PM 09/24/2021    5:48 PM 07/18/2021    9:05 AM  Advanced Directives  Does Patient Have a Medical Advance Directive? No No No No No  Would patient like information on creating a medical advance directive? Yes (MAU/Ambulatory/Procedural Areas - Information given)  No - Patient declined No - Patient declined No - Patient declined    Current Medications (verified) Outpatient Encounter Medications as of 09/30/2023  Medication Sig   aspirin EC 81 MG tablet Take 1 tablet (81 mg total) by mouth daily. Swallow whole.   CARDIZEM CD 180 MG 24 hr capsule Take 1 capsule (180 mg total)  by mouth daily.   carvedilol (COREG) 6.25 MG tablet Take 1 tablet (6.25 mg total) by mouth 2 (two) times daily.   COSOPT 2-0.5 % ophthalmic solution 1 drop 2 (two) times daily.   fluticasone (FLONASE) 50 MCG/ACT nasal spray Place 1 spray into both nostrils daily.   ibuprofen (ADVIL) 600 MG tablet Take 1 tablet (600 mg total) by mouth every 6 (six) hours as needed.   latanoprost (XALATAN) 0.005 % ophthalmic solution Place 1 drop into both eyes at bedtime.   losartan (COZAAR) 100 MG tablet TAKE 1 TABLET EVERY DAY   meloxicam (MOBIC) 15 MG tablet Take 1 tablet (15 mg total) by mouth daily.   methocarbamol (ROBAXIN) 500 MG tablet Take 1 tablet (500 mg total) by mouth every 6 (six) hours as needed for muscle spasms.   rosuvastatin (CRESTOR) 10 MG tablet Take 1 tablet (10 mg total) by mouth daily.   sildenafil (VIAGRA) 100 MG tablet Take 1 tab 1 hour  prior to incourse   tamsulosin (FLOMAX) 0.4 MG CAPS capsule Take 2 tablets once a day   silver sulfADIAZINE (SILVADENE) 1 % cream Apply 1 Application topically daily. (Patient not taking: Reported on 09/30/2023)   No facility-administered encounter medications on file as of 09/30/2023.    Allergies (verified) Patient has no known allergies.   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   Wolff-Parkinson-White syndrome    Past Surgical History:  Procedure Laterality Date   EYE SURGERY  Family History  Problem Relation Age of Onset   Heart disease Mother    Lupus Sister    Lupus Maternal Grandmother    Social History   Socioeconomic History   Marital status: Married    Spouse name: Garrit Marrow   Number of children: 3   Years of education: 12   Highest education level: 12th grade  Occupational History   Not on file  Tobacco Use   Smoking status: Every Day    Current packs/day: 0.25    Average packs/day: 0.3 packs/day for 45.2 years (11.3 ttl pk-yrs)     Types: Cigarettes    Start date: 08/04/1978    Passive exposure: Current   Smokeless tobacco: Never   Tobacco comments:    5 cigarettes per day   Vaping Use   Vaping status: Never Used  Substance and Sexual Activity   Alcohol use: Never   Drug use: Never   Sexual activity: Not Currently  Other Topics Concern   Not on file  Social History Narrative   3 children and 17 grandchildren and 5 great-grandchildren   Social Drivers of Health   Financial Resource Strain: Medium Risk (09/30/2023)   Overall Financial Resource Strain (CARDIA)    Difficulty of Paying Living Expenses: Somewhat hard  Food Insecurity: No Food Insecurity (09/30/2023)   Hunger Vital Sign    Worried About Running Out of Food in the Last Year: Never true    Ran Out of Food in the Last Year: Never true  Transportation Needs: No Transportation Needs (09/30/2023)   PRAPARE - Administrator, Civil Service (Medical): No    Lack of Transportation (Non-Medical): No  Physical Activity: Inactive (09/30/2023)   Exercise Vital Sign    Days of Exercise per Week: 0 days    Minutes of Exercise per Session: 0 min  Stress: No Stress Concern Present (09/30/2023)   [employer] of Occupational Health - Occupational Stress Questionnaire    Feeling of Stress : Only a little  Social Connections: Moderately Integrated (09/30/2023)   Social Connection and Isolation Panel [NHANES]    Frequency of Communication with Friends and Family: Twice a week    Frequency of Social Gatherings with Friends and Family: Once a week    Attends Religious Services: More than 4 times per year    Active Member of [employer] or Organizations: No    Attends Engineer, structural: Never    Marital Status: Married    Tobacco Counseling Ready to quit: Not Answered Counseling given: Not Answered Tobacco comments: 5 cigarettes per day     Clinical Intake:  Pre-visit preparation completed: Yes  Pain : No/denies pain     BMI -  recorded: 18.48 Nutritional Status: BMI <19  Underweight Nutritional Risks: None Diabetes: No  Lab Results  Component Value Date   HGBA1C 5.4 09/14/2021     How often do you need to have someone help you when you read instructions, pamphlets, or other written materials from your doctor or pharmacy?: 3 - Sometimes What is the last grade level you completed in school?: 12th  Interpreter Needed?: No  Information entered by :: Tora Kindred, CMA   Activities of Daily Living     09/30/2023    3:18 PM  In your present state of health, do you have any difficulty performing the following activities:  Hearing? 0  Vision? 1  Comment can't see to drive at night  Difficulty concentrating or making decisions? 0  Walking  or climbing stairs? 1  Comment due to sob  Dressing or bathing? 0  Doing errands, shopping? 1  Comment wife Insurance claims handler and eating ? N  Using the Toilet? N  In the past six months, have you accidently leaked urine? N  Do you have problems with loss of bowel control? N  Managing your Medications? N  Managing your Finances? N  Housekeeping or managing your Housekeeping? N    Patient Care Team: Marjie Skiff, NP as PCP - General (Nurse Practitioner) Lanier Prude, MD as PCP - Electrophysiology (Cardiology) Riki Altes, MD (Urology)  Indicate any recent Medical Services you may have received from other than [facility] providers in the past year (date may be approximate).     Assessment:   This is a routine wellness examination for Makyi.  Hearing/Vision screen Hearing Screening - Comments:: Denies hearing loss Vision Screening - Comments:: Gets eye exams, [facility]   Goals Addressed             This Visit's Progress    Patient Stated       Gain body mass by lifting weights       Depression Screen     09/30/2023    3:25 PM 08/13/2023    3:13 PM 03/28/2023   10:46 AM 11/22/2022    1:25 PM 09/24/2022    2:31 PM 09/20/2022   10:40  AM 03/22/2022   10:45 AM  PHQ 2/9 Scores  PHQ - 2 Score 6 2 3 2 1 2 2   PHQ- 9 Score 11 9 6 3 4 7 8     Fall Risk     09/30/2023    3:33 PM 08/13/2023    3:12 PM 03/28/2023   10:46 AM 11/22/2022    1:25 PM 09/24/2022    2:34 PM  Fall Risk   Falls in the past year? 1 1 1 1 1   Number falls in past yr: 1 1 1 1  0  Injury with Fall? 1 0 1 1 0  Risk for fall due to : History of fall(s);Impaired balance/gait;Orthopedic patient No Fall Risks Impaired balance/gait History of fall(s) History of fall(s)  Follow up Falls prevention discussed;Falls evaluation completed;Education provided Falls evaluation completed Falls evaluation completed;Education provided;Falls prevention discussed Falls evaluation completed Falls evaluation completed;Falls prevention discussed    MEDICARE RISK AT HOME:  Medicare Risk at Home Any stairs in or around the home?: No If so, are there any without handrails?: No Home free of loose throw rugs in walkways, pet beds, electrical cords, etc?: Yes Adequate lighting in your home to reduce risk of falls?: Yes Life alert?: No Use of a cane, walker or w/c?: No Grab bars in the bathroom?: Yes Shower chair or bench in shower?: No Elevated toilet seat or a handicapped toilet?: No  TIMED UP AND GO:  Was the test performed?  No  Cognitive Function: 6CIT completed        09/30/2023    3:34 PM 09/24/2022    2:38 PM  6CIT Screen  What Year? 0 points 0 points  What month? 0 points 0 points  What time? 0 points 0 points  Count back from 20 0 points 2 points  Months in reverse 2 points 0 points  Repeat phrase 2 points 0 points  Total Score 4 points 2 points    Immunizations Immunization History  Administered Date(s) Administered   Influenza,inj,Quad PF,6+ Mos 05/15/2021   PFIZER Comirnaty(Gray Top)Covid-19 Tri-Sucrose Vaccine 11/01/2019, 11/22/2019  PFIZER(Purple Top)SARS-COV-2 Vaccination 11/01/2019, 11/22/2019   Pneumococcal Polysaccharide-23 01/19/2016   Td  06/27/2000   Td (Adult), 2 Lf Tetanus Toxid, Preservative Free 06/27/2000   Tdap 12/11/2012    Screening Tests Health Maintenance  Topic Date Due   Pneumonia Vaccine 46+ Years old (2 of 2 - PCV) 01/18/2017   COVID-19 Vaccine (5 - 2024-25 season) 10/13/2023 (Originally 03/02/2023)   Zoster Vaccines- Shingrix (1 of 2) 11/10/2023 (Originally 08/23/1977)   DTaP/Tdap/Td (4 - Td or Tdap) 08/12/2024 (Originally 12/12/2022)   INFLUENZA VACCINE  01/30/2024   Medicare Annual Wellness (AWV)  09/29/2024   Colonoscopy  07/01/2026   Hepatitis C Screening  Completed   HIV Screening  Completed   HPV VACCINES  Aged Out    Health Maintenance  Health Maintenance Due  Topic Date Due   Pneumonia Vaccine 62+ Years old (2 of 2 - PCV) 01/18/2017   Health Maintenance Items Addressed: See Nurse Notes  Additional Screening:  Vision Screening: Recommended annual ophthalmology exams for early detection of glaucoma and other disorders of the eye.  Dental Screening: Recommended annual dental exams for proper oral hygiene  Community Resource Referral / Chronic Care Management: CRR required this visit?  Yes   CCM required this visit?  No     Plan:     I have personally reviewed and noted the following in the patient's chart:   Medical and social history Use of alcohol, tobacco or illicit drugs  Current medications and supplements including opioid prescriptions. Patient is not currently taking opioid prescriptions. Functional ability and status Nutritional status Physical activity Advanced directives List of other physicians Hospitalizations, surgeries, and ER visits in previous 12 months Vitals Screenings to include cognitive, depression, and falls Referrals and appointments  In addition, I have reviewed and discussed with patient certain preventive protocols, quality metrics, and best practice recommendations. A written personalized care plan for preventive services as well as general  preventive health recommendations were provided to patient.     Tora Kindred, CMA   09/30/2023   After Visit Summary: (Mail) Due to this being a telephonic visit, the after visit summary with patients personalized plan was offered to patient via mail   Notes:  6 CIT Score - 11 I have placed a referral to social worker to talk with patient about his depression symptoms Needs pneumonia and Tdap vaccines Declined covid and shingles vaccines.

## 2023-09-30 NOTE — Patient Instructions (Addendum)
 [name] , Thank you for taking time to come for your Medicare Wellness Visit. I appreciate your ongoing commitment to your health goals. Please review the following plan we discussed and let me know if I can assist you in the future.   Referrals/Orders/Follow-Ups/Clinician Recommendations: I have placed a referral to community resources for someone to call and talk to you about feeling depressed. Someone will be calling you. Get the pneumonia and tetanus vaccines at your convenience.  This is a list of the screening recommended for you and due dates:  Health Maintenance  Topic Date Due   Pneumonia Vaccine (2 of 2 - PCV) 01/18/2017   COVID-19 Vaccine (5 - 2024-25 season) 10/13/2023*   Zoster (Shingles) Vaccine (1 of 2) 11/10/2023*   DTaP/Tdap/Td vaccine (4 - Td or Tdap) 08/12/2024*   Flu Shot  01/30/2024   Medicare Annual Wellness Visit  09/29/2024   Colon Cancer Screening  07/01/2026   Hepatitis C Screening  Completed   HIV Screening  Completed   HPV Vaccine  Aged Out  *Topic was postponed. The date shown is not the original due date.    Advanced directives: (ACP Link)Information on Advanced Care Planning can be found at [facility] Advance Health Care Directives Advance Health Care Directives. [email or web address] You may also get these forms at your doctor's office.  Once you have completed the forms, please bring a copy of your health care power of attorney and living will to the office to be added to your chart at your convenience.   Next Medicare Annual Wellness Visit scheduled for next year: Yes, 10/12/24 @ 10:00am (phone visit)  Fall Prevention in the Home, Adult Falls can cause injuries and affect people of all ages. There are many simple things that you can do to make your home safe and to help prevent falls. If you need it, ask for help making these changes. What actions can I take to prevent falls? General information Use good lighting in all rooms. Make sure  to: Replace any light bulbs that burn out. Turn on lights if it is dark and use night-lights. Keep items that you use often in easy-to-reach places. Lower the shelves around your home if needed. Move furniture so that there are clear paths around it. Do not keep throw rugs or other things on the floor that can make you trip. If any of your floors are uneven, fix them. Add color or contrast paint or tape to clearly mark and help you see: Grab bars or handrails. First and last steps of staircases. Where the edge of each step is. If you use a ladder or stepladder: Make sure that it is fully opened. Do not climb a closed ladder. Make sure the sides of the ladder are locked in place. Have someone hold the ladder while you use it. Know where your pets are as you move through your home. What can I do in the bathroom?     Keep the floor dry. Clean up any water that is on the floor right away. Remove soap buildup in the bathtub or shower. Buildup makes bathtubs and showers slippery. Use non-skid mats or decals on the floor of the bathtub or shower. Attach bath mats securely with double-sided, non-slip rug tape. If you need to sit down while you are in the shower, use a non-slip stool. Install grab bars by the toilet and in the bathtub and shower. Do not use towel bars as grab bars. What can I  do in the bedroom? Make sure that you have a light by your bed that is easy to reach. Do not use any sheets or blankets on your bed that hang to the floor. Have a firm bench or chair with side arms that you can use for support when you get dressed. What can I do in the kitchen? Clean up any spills right away. If you need to reach something above you, use a sturdy step stool that has a grab bar. Keep electrical cables out of the way. Do not use floor polish or wax that makes floors slippery. What can I do with my stairs? Do not leave anything on the stairs. Make sure that you have a light switch at  the top and the bottom of the stairs. Have them installed if you do not have them. Make sure that there are handrails on both sides of the stairs. Fix handrails that are broken or loose. Make sure that handrails are as long as the staircases. Install non-slip stair treads on all stairs in your home if they do not have carpet. Avoid having throw rugs at the top or bottom of stairs, or secure the rugs with carpet tape to prevent them from moving. Choose a carpet design that does not hide the edge of steps on the stairs. Make sure that carpet is firmly attached to the stairs. Fix any carpet that is loose or worn. What can I do on the outside of my home? Use bright outdoor lighting. Repair the edges of walkways and driveways and fix any cracks. Clear paths of anything that can make you trip, such as tools or rocks. Add color or contrast paint or tape to clearly mark and help you see high doorway thresholds. Trim any bushes or trees on the main path into your home. Check that handrails are securely fastened and in good repair. Both sides of all steps should have handrails. Install guardrails along the edges of any raised decks or porches. Have leaves, snow, and ice cleared regularly. Use sand, salt, or ice melt on walkways during winter months if you live where there is ice and snow. In the garage, clean up any spills right away, including grease or oil spills. What other actions can I take? Review your medicines with your health care provider. Some medicines can make you confused or feel dizzy. This can increase your chance of falling. Wear closed-toe shoes that fit well and support your feet. Wear shoes that have rubber soles and low heels. Use a cane, walker, scooter, or crutches that help you move around if needed. Talk with your provider about other ways that you can decrease your risk of falls. This may include seeing a physical therapist to learn to do exercises to improve movement and  strength. Where to find more information Centers for Disease Control and Prevention, STEADI: TonerPromos.no [employer] on Aging: BaseRingTones.pl National Institute on Aging: BaseRingTones.pl Contact a health care provider if: You are afraid of falling at home. You feel weak, drowsy, or dizzy at home. You fall at home. Get help right away if you: Lose consciousness or have trouble moving after a fall. Have a fall that causes a head injury. These symptoms may be an emergency. Get help right away. Call 911. Do not wait to see if the symptoms will go away. Do not drive yourself to the hospital. This information is not intended to replace advice given to you by your health care provider. Make sure you discuss any  questions you have with your health care provider. Document Revised: 02/18/2022 Document Reviewed: 02/18/2022 Elsevier Patient Education  2024 [employer].

## 2023-10-01 ENCOUNTER — Telehealth: Payer: Self-pay

## 2023-10-01 NOTE — Progress Notes (Signed)
 Complex Care Management Note  Care Guide Note 10/01/2023 Name: Jesus Price MRN: [id number] DOB: 1961/06/20  Jesus Price is a 63 y.o. year old male who sees Aura Dials T, NP for primary care. I reached out to [name] by phone today to offer complex care management services.  Mr. Jerger was given information about Complex Care Management services today including:   The Complex Care Management services include support from the care team which includes your Nurse Care Manager, Clinical Social Worker, or Pharmacist.  The Complex Care Management team is here to help remove barriers to the health concerns and goals most important to you. Complex Care Management services are voluntary, and the patient may decline or stop services at any time by request to their care team member.   Complex Care Management Consent Status: Patient agreed to services and verbal consent obtained.   Follow up plan:  Telephone appointment with complex care management team member scheduled for:  10/02/2023  Encounter Outcome:  Patient Scheduled  Penne Lash , RMA     [place]  [facility], [facility] Guide  Direct Dial: [phone]  Website: [email or web address]

## 2023-10-02 ENCOUNTER — Ambulatory Visit: Payer: Self-pay | Admitting: Licensed Clinical Social Worker

## 2023-10-03 NOTE — Patient Outreach (Signed)
 Care Coordination   Initial Visit Note   10/03/2023 Name: Jesus Price MRN: [id number] DOB: 03-May-1961  Jesus Price is a 63 y.o. year old male who sees Jesus Dials T, NP for primary care. I spoke with  Jesus Price by phone today.  What matters to the patients health and wellness today?  Jesus Price reports depressed mood due to feeling alone.     Goals Addressed             This Visit's Progress    Care Coordination       Patient Stated       Activities and task to complete in order to accomplish goals.   Start / continue relaxed breathing 3 times daily Self Support options  (contact therapy options given to begin in person therapy. ) Visit senior rec center           SDOH assessments and interventions completed:  No   Already completed 09/30/2023 Care Coordination Interventions:  Yes, provided  Interventions Today    Flowsheet Row Most Recent Value  Education Interventions   Education Provided Provided Education  Provided Verbal Education On Community Resources  [provided Jesus Price address, hours and phone number of local community rec centers to decrease social isolation]  Mental Health Interventions   Mental Health Discussed/Reviewed Mental Health Discussed, Depression, Coping Strategies  [Pt reports social isolation and depressed mood.]       Follow up plan: Follow up call scheduled for 10/23/2023    Encounter Outcome:  Patient Visit Completed   Jesus Price MSW, LCSW Licensed Clinical Social Worker  [facility], Population Health Direct Dial: [phone]  Fax: [phone]

## 2023-10-03 NOTE — Patient Instructions (Signed)
 Visit Information  Thank you for taking time to visit with me today. Please don't hesitate to contact me if I can be of assistance to you.   Following are the goals we discussed today:   Goals Addressed             This Visit's Progress    Care Coordination       Patient Stated       Activities and task to complete in order to accomplish goals.   Start / continue relaxed breathing 3 times daily Self Support options  (contact therapy options given to begin in person therapy. ) Visit senior rec center           Our next appointment is by telephone on 10/23/2023 at 10am  Please call the care guide team at [phone] if you need to cancel or reschedule your appointment.   If you are experiencing a Mental Health or Behavioral Health Crisis or need someone to talk to, please call 911   Patient verbalizes understanding of instructions and care plan provided today and agrees to view in MyChart. Active MyChart status and patient understanding of how to access instructions and care plan via MyChart confirmed with patient.     Telephone follow up appointment with care management team member scheduled for:10/23/2023  Gwyndolyn Saxon MSW, LCSW Licensed Clinical Social Worker  [facility], Population Health Direct Dial: [phone]  Fax: [phone]

## 2023-10-06 ENCOUNTER — Ambulatory Visit (INDEPENDENT_AMBULATORY_CARE_PROVIDER_SITE_OTHER): Payer: Medicare HMO | Admitting: Nurse Practitioner

## 2023-10-06 ENCOUNTER — Encounter: Payer: Self-pay | Admitting: Nurse Practitioner

## 2023-10-06 VITALS — BP 94/54 | HR 59 | Temp 97.7°F | Ht 67.0 in | Wt 119.8 lb

## 2023-10-06 DIAGNOSIS — F419 Anxiety disorder, unspecified: Secondary | ICD-10-CM

## 2023-10-06 DIAGNOSIS — E782 Mixed hyperlipidemia: Secondary | ICD-10-CM | POA: Diagnosis not present

## 2023-10-06 DIAGNOSIS — Z23 Encounter for immunization: Secondary | ICD-10-CM

## 2023-10-06 DIAGNOSIS — F17200 Nicotine dependence, unspecified, uncomplicated: Secondary | ICD-10-CM | POA: Insufficient documentation

## 2023-10-06 DIAGNOSIS — I428 Other cardiomyopathies: Secondary | ICD-10-CM | POA: Diagnosis not present

## 2023-10-06 DIAGNOSIS — B182 Chronic viral hepatitis C: Secondary | ICD-10-CM | POA: Diagnosis not present

## 2023-10-06 DIAGNOSIS — F331 Major depressive disorder, recurrent, moderate: Secondary | ICD-10-CM

## 2023-10-06 DIAGNOSIS — R3914 Feeling of incomplete bladder emptying: Secondary | ICD-10-CM

## 2023-10-06 DIAGNOSIS — Z0000 Encounter for general adult medical examination without abnormal findings: Secondary | ICD-10-CM

## 2023-10-06 DIAGNOSIS — I456 Pre-excitation syndrome: Secondary | ICD-10-CM | POA: Diagnosis not present

## 2023-10-06 DIAGNOSIS — E559 Vitamin D deficiency, unspecified: Secondary | ICD-10-CM

## 2023-10-06 DIAGNOSIS — N401 Enlarged prostate with lower urinary tract symptoms: Secondary | ICD-10-CM | POA: Diagnosis not present

## 2023-10-06 DIAGNOSIS — I10 Essential (primary) hypertension: Secondary | ICD-10-CM

## 2023-10-06 LAB — MICROALBUMIN, URINE WAIVED
Creatinine, Urine Waived: 300 mg/dL (ref 10–300)
Microalb, Ur Waived: 80 mg/L — ABNORMAL HIGH (ref 0–19)

## 2023-10-06 MED ORDER — ROSUVASTATIN CALCIUM 10 MG PO TABS: 10.0000 mg | ORAL_TABLET | Freq: Every day | ORAL | 4 refills | Status: DC

## 2023-10-06 MED ORDER — LOSARTAN POTASSIUM 50 MG PO TABS
50.0000 mg | ORAL_TABLET | Freq: Every day | ORAL | 3 refills | Status: DC
Start: 2023-10-06 — End: 2023-11-10

## 2023-10-06 MED ORDER — ASPIRIN 81 MG PO TBEC: 81.0000 mg | DELAYED_RELEASE_TABLET | Freq: Every day | ORAL | Status: AC

## 2023-10-06 MED ORDER — CARDIZEM CD 180 MG PO CP24: 180.0000 mg | ORAL_CAPSULE | Freq: Every day | ORAL | 4 refills | Status: DC

## 2023-10-06 NOTE — Assessment & Plan Note (Signed)
 I have recommended complete cessation of tobacco use. I have discussed various options available for assistance with tobacco cessation including over the counter methods (Nicotine gum, patch and lozenges). We also discussed prescription options (Chantix, Nicotine Inhaler / Nasal Spray). The patient is not interested in pursuing any prescription tobacco cessation options at this time.

## 2023-10-06 NOTE — Assessment & Plan Note (Signed)
 Chronic, ongoing.  Followed by urology.  PSA on labs today.  Continue current medication regimen.

## 2023-10-06 NOTE — Assessment & Plan Note (Signed)
 Chronic, ongoing.  Continue Rosuvastatin daily and adjust as needed.  Lipid panel today.

## 2023-10-06 NOTE — Assessment & Plan Note (Signed)
 Chronic, stable. Continue to follow with cardiology and medication regimen as prescribed by them. To schedule visit for May 2025.

## 2023-10-06 NOTE — Progress Notes (Signed)
 BP (!) 94/54   Pulse (!) 59   Temp 97.7 F (36.5 C) (Oral)   Ht 5\' 7"  (1.702 m)   Wt 119 lb 12.8 oz (54.3 kg)   SpO2 96%   BMI 18.76 kg/m    Subjective:    Patient ID: Jesus Price, male    DOB: 1960/12/05, 63 y.o.   MRN: [id number]  HPI: Jesus Price is a 63 y.o. male presenting on 10/06/2023 for comprehensive medical examination. Current medical complaints include:none  He currently lives with: wife Interim Problems from his last visit: no  Continues on Flomax for BPH, last saw urology 08/22/23.  HYPERTENSION & HYPERLIPIDEMIA Continues Carvedilol, Cardizem, and Losartan.  Cardiology last 11/29/22.  Has WPW. Current every day smoker, 4 cigarettes a day.  Has smoked since he was 63 years old.  No alcohol use at home.  Taking Rosuvastatin daily for HLD.   History of Hep C was treated in 2015 and overall has been stable since. Hypertension status: controlled  Satisfied with current treatment? yes Duration of hypertension: chronic BP monitoring frequency:  weekly BP range: <100/60 on average, has been working on stress and improving this BP medication side effects:  no Medication compliance: good compliance Aspirin: yes Recurrent headaches: no Visual changes: no Palpitations: no Dyspnea: no Chest pain: no Lower extremity edema: no Dizzy/lightheaded: no   DEPRESSION Currently no medications, does not wish to take anything.  Has been on self help techniques.  Vitamin D level low in past, taking supplement. Mood status: stable Satisfied with current treatment?: yes Symptom severity: mild Duration of current treatment : chronic Side effects: no Medication compliance: good compliance Depressed mood: occasional Anxious mood: occasional Anhedonia: no Significant weight loss or gain: no Insomnia: none Fatigue: no Feelings of worthlessness or guilt: no Impaired concentration/indecisiveness: no Suicidal ideations: no Hopelessness: no Crying spells: no     10/06/2023    3:06 PM 09/30/2023    3:25 PM 08/13/2023    3:13 PM 03/28/2023   10:46 AM 11/22/2022    1:25 PM  Depression screen PHQ 2/9  Decreased Interest 3 3 2 2 2   Down, Depressed, Hopeless 0 3 0 1 0  PHQ - 2 Score 3 6 2 3 2   Altered sleeping 1 0 1 0 0  Tired, decreased energy 1 3 3 3 1   Change in appetite 1 2 3  0 0  Feeling bad or failure about yourself  0 0 0 0 0  Trouble concentrating 0 0 0 0 0  Moving slowly or fidgety/restless 0 0 0 0 0  Suicidal thoughts 0 0 0 0 0  PHQ-9 Score 6 11 9 6 3   Difficult doing work/chores Not difficult at all Somewhat difficult Not difficult at all Somewhat difficult       10/06/2023    3:06 PM 08/13/2023    3:13 PM 03/28/2023   10:47 AM 11/22/2022    1:25 PM  GAD 7 : Generalized Anxiety Score  Nervous, Anxious, on Edge 0 1 1 1   Control/stop worrying 0 1 2 0  Worry too much - different things 0 1 1 1   Trouble relaxing 1 0 0 0  Restless 0 0 0 0  Easily annoyed or irritable 1 2 2 1   Afraid - awful might happen 0 1 0 0  Total GAD 7 Score 2 6 6 3   Anxiety Difficulty Somewhat difficult Not difficult at all Somewhat difficult         11/22/2022  1:25 PM 03/28/2023   10:46 AM 08/13/2023    3:12 PM 09/30/2023    3:33 PM 10/06/2023    3:03 PM  Fall Risk  Falls in the past year? 1 1 1 1 1   Was there an injury with Fall? 1 1 0 1 0  Fall Risk Category Calculator 3 3 2 3 2   Patient at Risk for Falls Due to History of fall(s) Impaired balance/gait No Fall Risks History of fall(s);Impaired balance/gait;Orthopedic patient History of fall(s);Impaired balance/gait;Orthopedic patient  Fall risk Follow up Falls evaluation completed Falls evaluation completed;Education provided;Falls prevention discussed Falls evaluation completed Falls prevention discussed;Falls evaluation completed;Education provided Falls evaluation completed    Functional Status Survey: Is the patient deaf or have difficulty hearing?: No Does the patient have difficulty seeing, even when  wearing glasses/contacts?: No Does the patient have difficulty concentrating, remembering, or making decisions?: No Does the patient have difficulty walking or climbing stairs?: No Does the patient have difficulty dressing or bathing?: No Does the patient have difficulty doing errands alone such as visiting a doctor's office or shopping?: No   Past Medical History:  Past Medical History:  Diagnosis Date   Arthritis    Glaucoma    Hypertension    Situational depression 06/07/2016   Wolff-Parkinson-White syndrome     Surgical History:  Past Surgical History:  Procedure Laterality Date   EYE SURGERY      Medications:  Current Outpatient Medications on File Prior to Visit  Medication Sig   carvedilol (COREG) 6.25 MG tablet Take 1 tablet (6.25 mg total) by mouth 2 (two) times daily.   COSOPT 2-0.5 % ophthalmic solution 1 drop 2 (two) times daily.   fluticasone (FLONASE) 50 MCG/ACT nasal spray Place 1 spray into both nostrils daily.   ibuprofen (ADVIL) 600 MG tablet Take 1 tablet (600 mg total) by mouth every 6 (six) hours as needed.   latanoprost (XALATAN) 0.005 % ophthalmic solution Place 1 drop into both eyes at bedtime.   meloxicam (MOBIC) 15 MG tablet Take 1 tablet (15 mg total) by mouth daily.   methocarbamol (ROBAXIN) 500 MG tablet Take 1 tablet (500 mg total) by mouth every 6 (six) hours as needed for muscle spasms.   sildenafil (VIAGRA) 100 MG tablet Take 1 tab 1 hour  prior to incourse   tamsulosin (FLOMAX) 0.4 MG CAPS capsule Take 2 tablets once a day   No current facility-administered medications on file prior to visit.    Allergies:  No Known Allergies  Social History:  Social History   Socioeconomic History   Marital status: Married    Spouse name: Animal nutritionist   Number of children: 3   Years of education: 12   Highest education level: 12th grade  Occupational History   Not on file  Tobacco Use   Smoking status: Every Day    Current packs/day: 0.25     Average packs/day: 0.3 packs/day for 45.2 years (11.3 ttl pk-yrs)    Types: Cigarettes    Start date: 08/04/1978    Passive exposure: Current   Smokeless tobacco: Never   Tobacco comments:    5 cigarettes per day   Vaping Use   Vaping status: Never Used  Substance and Sexual Activity   Alcohol use: Never   Drug use: Never   Sexual activity: Not Currently  Other Topics Concern   Not on file  Social History Narrative   3 children and 17 grandchildren and 5 great-grandchildren   Social Drivers of Health  Financial Resource Strain: Medium Risk (09/30/2023)   Overall Financial Resource Strain (CARDIA)    Difficulty of Paying Living Expenses: Somewhat hard  Food Insecurity: No Food Insecurity (09/30/2023)   Hunger Vital Sign    Worried About Running Out of Food in the Last Year: Never true    Ran Out of Food in the Last Year: Never true  Transportation Needs: No Transportation Needs (09/30/2023)   PRAPARE - Administrator, Civil Service (Medical): No    Lack of Transportation (Non-Medical): No  Physical Activity: Inactive (09/30/2023)   Exercise Vital Sign    Days of Exercise per Week: 0 days    Minutes of Exercise per Session: 0 min  Stress: No Stress Concern Present (09/30/2023)   [employer] of Occupational Health - Occupational Stress Questionnaire    Feeling of Stress : Only a little  Social Connections: Moderately Integrated (09/30/2023)   Social Connection and Isolation Panel [NHANES]    Frequency of Communication with Friends and Family: Twice a week    Frequency of Social Gatherings with Friends and Family: Once a week    Attends Religious Services: More than 4 times per year    Active Member of [employer] or Organizations: No    Attends Banker Meetings: Never    Marital Status: Married  Catering manager Violence: Not At Risk (09/30/2023)   Humiliation, Afraid, Rape, and Kick questionnaire    Fear of Current or Ex-Partner: No    Emotionally Abused: No     Physically Abused: No    Sexually Abused: No   Social History   Tobacco Use  Smoking Status Every Day   Current packs/day: 0.25   Average packs/day: 0.3 packs/day for 45.2 years (11.3 ttl pk-yrs)   Types: Cigarettes   Start date: 08/04/1978   Passive exposure: Current  Smokeless Tobacco Never  Tobacco Comments   5 cigarettes per day    Social History   Substance and Sexual Activity  Alcohol Use Never    Family History:  Family History  Problem Relation Age of Onset   Heart disease Mother    Lupus Sister    Lupus Maternal Grandmother     Past medical history, surgical history, medications, allergies, family history and social history reviewed with patient today and changes made to appropriate areas of the chart.   ROS All other ROS negative except what is listed above and in the HPI.      Objective:    BP (!) 94/54   Pulse (!) 59   Temp 97.7 F (36.5 C) (Oral)   Ht 5\' 7"  (1.702 m)   Wt 119 lb 12.8 oz (54.3 kg)   SpO2 96%   BMI 18.76 kg/m   Wt Readings from Last 3 Encounters:  10/06/23 119 lb 12.8 oz (54.3 kg)  09/30/23 118 lb (53.5 kg)  08/22/23 117 lb (53.1 kg)    Physical Exam Vitals and nursing note reviewed.  Constitutional:      General: He is awake. He is not in acute distress.    Appearance: He is well-developed, well-groomed and underweight. He is not ill-appearing or toxic-appearing.  HENT:     Head: Normocephalic and atraumatic.     Right Ear: Hearing, tympanic membrane, ear canal and external ear normal. No drainage.     Left Ear: Hearing, tympanic membrane, ear canal and external ear normal. No drainage.     Nose: Nose normal.     Mouth/Throat:     Pharynx:  Uvula midline.  Eyes:     General: Lids are normal.        Right eye: No discharge.        Left eye: No discharge.     Extraocular Movements: Extraocular movements intact.     Conjunctiva/sclera: Conjunctivae normal.     Pupils: Pupils are equal, round, and reactive to light.      Visual Fields: Right eye visual fields normal and left eye visual fields normal.  Neck:     Thyroid: No thyromegaly.     Vascular: No carotid bruit or JVD.     Trachea: Trachea normal.  Cardiovascular:     Rate and Rhythm: Normal rate and regular rhythm.     Heart sounds: Normal heart sounds, S1 normal and S2 normal. No murmur heard.    No gallop.  Pulmonary:     Effort: Pulmonary effort is normal. No accessory muscle usage or respiratory distress.     Breath sounds: Normal breath sounds.  Abdominal:     General: Bowel sounds are normal.     Palpations: Abdomen is soft. There is no hepatomegaly or splenomegaly.     Tenderness: There is no abdominal tenderness.  Musculoskeletal:        General: Normal range of motion.     Cervical back: Normal range of motion and neck supple.     Right lower leg: No edema.     Left lower leg: No edema.  Lymphadenopathy:     Head:     Right side of head: No submental, submandibular, tonsillar, preauricular or posterior auricular adenopathy.     Left side of head: No submental, submandibular, tonsillar, preauricular or posterior auricular adenopathy.     Cervical: No cervical adenopathy.  Skin:    General: Skin is warm and dry.     Capillary Refill: Capillary refill takes less than 2 seconds.     Findings: No rash.  Neurological:     Mental Status: He is alert and oriented to person, place, and time.     Gait: Gait is intact.     Deep Tendon Reflexes: Reflexes are normal and symmetric.     Reflex Scores:      Brachioradialis reflexes are 2+ on the right side and 2+ on the left side.      Patellar reflexes are 2+ on the right side and 2+ on the left side. Psychiatric:        Attention and Perception: Attention normal.        Mood and Affect: Mood normal.        Speech: Speech normal.        Behavior: Behavior normal. Behavior is cooperative.        Thought Content: Thought content normal.        Cognition and Memory: Cognition normal.        09/30/2023    3:34 PM 09/24/2022    2:38 PM  6CIT Screen  What Year? 0 points 0 points  What month? 0 points 0 points  What time? 0 points 0 points  Count back from 20 0 points 2 points  Months in reverse 2 points 0 points  Repeat phrase 2 points 0 points  Total Score 4 points 2 points   Results for orders placed or performed in visit on 09/19/23  CBC with Differential/Platelet   Collection Time: 09/19/23 11:01 AM  Result Value Ref Range   WBC 5.5 3.4 - 10.8 x10E3/uL   RBC 4.15 4.14 -  5.80 x10E6/uL   Hemoglobin 13.5 13.0 - 17.7 g/dL   Hematocrit 16.1 09.6 - 51.0 %   MCV 97 79 - 97 fL   MCH 32.5 26.6 - 33.0 pg   MCHC 33.4 31.5 - 35.7 g/dL   RDW 04.5 40.9 - 81.1 %   Platelets 197 150 - 450 x10E3/uL   Neutrophils 60 Not Estab. %   Lymphs 34 Not Estab. %   Monocytes 4 Not Estab. %   Eos 1 Not Estab. %   Basos 1 Not Estab. %   Neutrophils Absolute 3.3 1.4 - 7.0 x10E3/uL   Lymphocytes Absolute 1.9 0.7 - 3.1 x10E3/uL   Monocytes Absolute 0.2 0.1 - 0.9 x10E3/uL   EOS (ABSOLUTE) 0.1 0.0 - 0.4 x10E3/uL   Basophils Absolute 0.0 0.0 - 0.2 x10E3/uL   Immature Granulocytes 0 Not Estab. %   Immature Grans (Abs) 0.0 0.0 - 0.1 x10E3/uL  Iron   Collection Time: 09/19/23 11:01 AM  Result Value Ref Range   Iron 83 38 - 169 ug/dL  Ferritin   Collection Time: 09/19/23 11:01 AM  Result Value Ref Range   Ferritin 140 30 - 400 ng/mL      Assessment & Plan:   Problem List Items Addressed This Visit       Cardiovascular and Mediastinum   Essential hypertension   Chronic, stable.  BP well below goal today, on lower side, and on lower side at home.  Recommend he monitor BP at least a few mornings a week at home and document.  DASH diet at home.  Continue current medication regimen and adjust as needed, with exception of reducing Losartan to 50 MG daily.  Educated him on this.  Labs today: CBC, CMP, TSH.  Urine ALB 80 March 2024, continue Losartan for kidney protection.        Relevant  Medications   losartan (COZAAR) 50 MG tablet   CARDIZEM CD 180 MG 24 hr capsule   aspirin EC 81 MG tablet   rosuvastatin (CRESTOR) 10 MG tablet   Other Relevant Orders   Microalbumin, Urine Waived   CBC with Differential/Platelet   Comprehensive metabolic panel with GFR   TSH   NICM (nonischemic cardiomyopathy) (HCC)   Chronic, ongoing.  Continue collaboration with cardiology.  Recent notes reviewed.  To schedule follow-up for visit with them in May.      Relevant Medications   losartan (COZAAR) 50 MG tablet   CARDIZEM CD 180 MG 24 hr capsule   aspirin EC 81 MG tablet   rosuvastatin (CRESTOR) 10 MG tablet   Wolff-Parkinson-White (WPW) syndrome   Chronic, stable. Continue to follow with cardiology and medication regimen as prescribed by them. To schedule visit for May 2025.      Relevant Medications   losartan (COZAAR) 50 MG tablet   CARDIZEM CD 180 MG 24 hr capsule   aspirin EC 81 MG tablet   rosuvastatin (CRESTOR) 10 MG tablet     Digestive   Hepatitis C, chronic (HCC)   Chronic, stable.  No recent GI visits, will have him return as needed.      Relevant Orders   Comprehensive metabolic panel with GFR     Genitourinary   Benign prostatic hyperplasia with incomplete bladder emptying   Chronic, ongoing.  Followed by urology.  PSA on labs today.  Continue current medication regimen.      Relevant Orders   PSA     Other   Anxiety   Chronic, stable without medication.  Wishes to continue off medication.  Denies SI/HI.      Mixed hyperlipidemia   Chronic, ongoing.  Continue Rosuvastatin daily and adjust as needed.  Lipid panel today.      Relevant Medications   losartan (COZAAR) 50 MG tablet   CARDIZEM CD 180 MG 24 hr capsule   aspirin EC 81 MG tablet   rosuvastatin (CRESTOR) 10 MG tablet   Other Relevant Orders   Comprehensive metabolic panel with GFR   Lipid Panel w/o Chol/HDL Ratio   Moderate episode of recurrent major depressive disorder (HCC) - Primary    Chronic, stable without medication.  Wishes to continue off medication.  Denies SI/HI.      Nicotine dependence with current use   I have recommended complete cessation of tobacco use. I have discussed various options available for assistance with tobacco cessation including over the counter methods (Nicotine gum, patch and lozenges). We also discussed prescription options (Chantix, Nicotine Inhaler / Nasal Spray). The patient is not interested in pursuing any prescription tobacco cessation options at this time.       Relevant Orders   [place] [facility] MEDICARE SCREENING   Vitamin D deficiency   Chronic, ongoing.  Taking supplement, continue this and check level today.      Relevant Orders   VITAMIN D 25 Hydroxy (Vit-D Deficiency, Fractures)   Other Visit Diagnoses       Encounter for annual physical exam       Annual physical today with labs and health maintenance reviewed, discussed with patient.       Discussed aspirin prophylaxis for myocardial infarction prevention and decision was made to continue ASA  LABORATORY TESTING:  Health maintenance labs ordered today as discussed above.   The natural history of prostate cancer and ongoing controversy regarding screening and potential treatment outcomes of prostate cancer has been discussed with the patient. The meaning of a false positive PSA and a false negative PSA has been discussed. He indicates understanding of the limitations of this screening test and wishes to proceed with screening PSA testing.  IMMUNIZATIONS:   - Tdap: Tetanus vaccination status reviewed: Refused. - Influenza: Refused - Pneumovax: Refused - Prevnar: Refused - Zostavax vaccine: Refused  SCREENING: - Colonoscopy: Up to date  Discussed with patient purpose of the colonoscopy is to detect colon cancer at curable precancerous or early stages   - AAA Screening: Ordered today  -Hearing Test: Not applicable  -Spirometry: Not applicable   PATIENT COUNSELING:     Sexuality: Discussed sexually transmitted diseases, partner selection, use of condoms, avoidance of unintended pregnancy  and contraceptive alternatives.   Advised to avoid cigarette smoking.  I discussed with the patient that most people either abstain from alcohol or drink within safe limits (<=14/week and <=4 drinks/occasion for males, <=7/weeks and <= 3 drinks/occasion for females) and that the risk for alcohol disorders and other health effects rises proportionally with the number of drinks per week and how often a drinker exceeds daily limits.  Discussed cessation/primary prevention of drug use and availability of treatment for abuse.   Diet: Encouraged to adjust caloric intake to maintain  or achieve ideal body weight, to reduce intake of dietary saturated fat and total fat, to limit sodium intake by avoiding high sodium foods and not adding table salt, and to maintain adequate dietary potassium and calcium preferably from fresh fruits, vegetables, and low-fat dairy products.    Stressed the importance of regular exercise  Injury prevention: Discussed safety belts, safety helmets,  smoke detector, smoking near bedding or upholstery.   Dental health: Discussed importance of regular tooth brushing, flossing, and dental visits.   Follow up plan: NEXT PREVENTATIVE PHYSICAL DUE IN 1 YEAR. Return in about 4 weeks (around 11/03/2023) for HTN -- reduced Losartan to 50 MG.

## 2023-10-06 NOTE — Assessment & Plan Note (Signed)
Chronic, stable without medication.  Wishes to continue off medication.  Denies SI/HI.

## 2023-10-06 NOTE — Assessment & Plan Note (Signed)
 Chronic, stable.  BP well below goal today, on lower side, and on lower side at home.  Recommend he monitor BP at least a few mornings a week at home and document.  DASH diet at home.  Continue current medication regimen and adjust as needed, with exception of reducing Losartan to 50 MG daily.  Educated him on this.  Labs today: CBC, CMP, TSH.  Urine ALB 80 March 2024, continue Losartan for kidney protection.

## 2023-10-06 NOTE — Assessment & Plan Note (Signed)
 Chronic, ongoing.  Taking supplement, continue this and check level today.

## 2023-10-06 NOTE — Assessment & Plan Note (Signed)
Chronic, stable.  No recent GI visits, will have him return as needed.

## 2023-10-06 NOTE — Assessment & Plan Note (Signed)
 Chronic, ongoing.  Continue collaboration with cardiology.  Recent notes reviewed.  To schedule follow-up for visit with them in May.

## 2023-10-07 ENCOUNTER — Encounter: Payer: Self-pay | Admitting: Nurse Practitioner

## 2023-10-07 LAB — COMPREHENSIVE METABOLIC PANEL WITH GFR
ALT: 12 IU/L (ref 0–44)
AST: 18 IU/L (ref 0–40)
Albumin: 4.3 g/dL (ref 3.9–4.9)
Alkaline Phosphatase: 73 IU/L (ref 44–121)
BUN/Creatinine Ratio: 16 (ref 10–24)
BUN: 16 mg/dL (ref 8–27)
Bilirubin Total: 0.2 mg/dL (ref 0.0–1.2)
CO2: 23 mmol/L (ref 20–29)
Calcium: 8.9 mg/dL (ref 8.6–10.2)
Chloride: 102 mmol/L (ref 96–106)
Creatinine, Ser: 0.99 mg/dL (ref 0.76–1.27)
Globulin, Total: 2.8 g/dL (ref 1.5–4.5)
Glucose: 115 mg/dL — ABNORMAL HIGH (ref 70–99)
Potassium: 4.2 mmol/L (ref 3.5–5.2)
Sodium: 139 mmol/L (ref 134–144)
Total Protein: 7.1 g/dL (ref 6.0–8.5)
eGFR: 85 mL/min/{1.73_m2} (ref >59)

## 2023-10-07 LAB — CBC WITH DIFFERENTIAL/PLATELET
Basophils Absolute: 0 10*3/uL (ref 0.0–0.2)
Basos: 1 %
EOS (ABSOLUTE): 0.1 10*3/uL (ref 0.0–0.4)
Eos: 1 %
Hematocrit: 38.3 % (ref 37.5–51.0)
Hemoglobin: 12.8 g/dL — ABNORMAL LOW (ref 13.0–17.7)
Immature Grans (Abs): 0 10*3/uL (ref 0.0–0.1)
Immature Granulocytes: 0 %
Lymphocytes Absolute: 1.9 10*3/uL (ref 0.7–3.1)
Lymphs: 38 %
MCH: 32.7 pg (ref 26.6–33.0)
MCHC: 33.4 g/dL (ref 31.5–35.7)
MCV: 98 fL — ABNORMAL HIGH (ref 79–97)
Monocytes Absolute: 0.2 10*3/uL (ref 0.1–0.9)
Monocytes: 5 %
Neutrophils Absolute: 2.7 10*3/uL (ref 1.4–7.0)
Neutrophils: 55 %
Platelets: 193 10*3/uL (ref 150–450)
RBC: 3.91 x10E6/uL — ABNORMAL LOW (ref 4.14–5.80)
RDW: 12.9 % (ref 11.6–15.4)
WBC: 4.9 10*3/uL (ref 3.4–10.8)

## 2023-10-07 LAB — LIPID PANEL W/O CHOL/HDL RATIO
Cholesterol, Total: 112 mg/dL (ref 100–199)
HDL: 54 mg/dL (ref >39)
LDL Chol Calc (NIH): 42 mg/dL (ref 0–99)
Triglycerides: 77 mg/dL (ref 0–149)
VLDL Cholesterol Cal: 16 mg/dL (ref 5–40)

## 2023-10-07 LAB — VITAMIN D 25 HYDROXY (VIT D DEFICIENCY, FRACTURES): Vit D, 25-Hydroxy: 31.7 ng/mL (ref 30.0–100.0)

## 2023-10-07 LAB — PSA: Prostate Specific Ag, Serum: 0.2 ng/mL (ref 0.0–4.0)

## 2023-10-07 LAB — TSH: TSH: 0.933 u[IU]/mL (ref 0.450–4.500)

## 2023-10-07 NOTE — Progress Notes (Signed)
 Contacted via MyChart   Good morning Jesus Price, your labs have returned: - CBC overall stable, hemoglobin a little low (very mild).  Continue to eat an iron rich diet at home and we will monitor closely. This level fluctuates for you. - Kidney function, creatinine and eGFR, remains normal, as is liver function, AST and ALT.  - TSH, thyroid lab, and PSA, prostate level, are normal. - Lipid panel shows levels well at goal, continue Rosuvastatin daily. - Vitamin D level in normal range.  Any questions? Keep being amazing!!  Thank you for allowing me to participate in your care.  I appreciate you. Kindest regards, Leor Whyte

## 2023-10-15 ENCOUNTER — Encounter: Payer: Self-pay | Admitting: Nurse Practitioner

## 2023-10-15 ENCOUNTER — Ambulatory Visit
Admission: RE | Admit: 2023-10-15 | Discharge: 2023-10-15 | Disposition: A | Discharge status: Home / Self Care | Source: Ambulatory Visit | Attending: Nurse Practitioner | Admitting: Nurse Practitioner

## 2023-10-15 DIAGNOSIS — Z136 Encounter for screening for cardiovascular disorders: Secondary | ICD-10-CM | POA: Diagnosis not present

## 2023-10-15 DIAGNOSIS — F17210 Nicotine dependence, cigarettes, uncomplicated: Secondary | ICD-10-CM | POA: Diagnosis not present

## 2023-10-15 DIAGNOSIS — F17200 Nicotine dependence, unspecified, uncomplicated: Secondary | ICD-10-CM | POA: Diagnosis present

## 2023-10-15 DIAGNOSIS — Z87891 Personal history of nicotine dependence: Secondary | ICD-10-CM | POA: Diagnosis not present

## 2023-10-15 NOTE — Progress Notes (Signed)
 Contacted via MyChart   Good afternoon Jesus Price, your imaging has returned and I have good news.  No aortic aneurysm.:)

## 2023-10-20 ENCOUNTER — Ambulatory Visit: Payer: Medicare HMO | Attending: Cardiology | Admitting: Cardiology

## 2023-10-20 ENCOUNTER — Ambulatory Visit: Payer: Medicare HMO

## 2023-10-20 VITALS — BP 132/82 | HR 59 | Ht 67.0 in | Wt 117.8 lb

## 2023-10-20 DIAGNOSIS — I10 Essential (primary) hypertension: Secondary | ICD-10-CM | POA: Diagnosis not present

## 2023-10-20 DIAGNOSIS — I428 Other cardiomyopathies: Secondary | ICD-10-CM

## 2023-10-20 DIAGNOSIS — F17210 Nicotine dependence, cigarettes, uncomplicated: Secondary | ICD-10-CM | POA: Diagnosis not present

## 2023-10-20 DIAGNOSIS — R002 Palpitations: Secondary | ICD-10-CM

## 2023-10-20 LAB — CUP PACEART REMOTE DEVICE CHECK
Date Time Interrogation Session: 20250420230914
Implantable Pulse Generator Implant Date: 20221228

## 2023-10-20 NOTE — Patient Instructions (Signed)
 Medication Instructions:  The current medical regimen is effective;  continue present plan and medications as directed. Please refer to the Current Medication list given to you today.   *If you need a refill on your cardiac medications before your next appointment, please call your pharmacy*  Testing/Procedures: Your physician has requested that you have an echocardiogram. Echocardiography is a painless test that uses sound waves to create images of your heart. It provides your doctor with information about the size and shape of your heart and how well your heart's chambers and valves are working.   You may receive an ultrasound enhancing agent through an IV if needed to better visualize your heart during the echo. This procedure takes approximately one hour.  There are no restrictions for this procedure.  This will take place at [address] [facility] [address], [place] [address]  Please note: We ask at that you not bring children with you during ultrasound (echo/ vascular) testing. Due to room size and safety concerns, children are not allowed in the ultrasound rooms during exams. Our front office staff cannot provide observation of children in our lobby area while testing is being conducted. An adult accompanying a patient to their appointment will only be allowed in the ultrasound room at the discretion of the ultrasound technician under special circumstances. We apologize for any inconvenience.   Follow-Up: At [facility], you and your health needs are our priority.  As part of our continuing mission to provide you with exceptional heart care, our providers are all part of one team.  This team includes your primary Cardiologist (physician) and Advanced Practice Providers or APPs (Physician Assistants and Nurse Practitioners) who all work together to provide you with the care you need, when you need it.  Your next appointment:   2-3 month(s)  Provider:   You may  see Dr.Agbor-Etang or one of the following Advanced Practice Providers on your designated Care Team:   Laneta Pintos, NP Gildardo Labrador, PA-C Varney Gentleman, PA-C Cadence Macon, PA-C Ronald Cockayne, NP Morey Ar, NP    We recommend signing up for the patient portal called "MyChart".  Sign up information is provided on this After Visit Summary.  MyChart is used to connect with patients for Virtual Visits (Telemedicine).  Patients are able to view lab/test results, encounter notes, upcoming appointments, etc.  Non-urgent messages can be sent to your provider as well.   To learn more about what you can do with MyChart, go to [email or web address].

## 2023-10-20 NOTE — Progress Notes (Signed)
 Cardiology Office Note:    Date:  10/20/2023   ID:  Jesus Price, DOB [birth year], MRN [id number]  PCP:  Lemar Pyles, NP   [facility] Providers Cardiologist:  None Electrophysiologist:  Boyce Byes, MD     Referring MD: Lemar Pyles, NP   No chief complaint on file.   History of Present Illness:    Jesus Price is a 63 y.o. male with a hx of nonischemic cardiomyopathy, hypertension, palpitations s/p ILR, current smoker x 40+ years presenting for follow-up.  Previously seen by electrophysiology team due to history of palpitations.  Workup with ILR showed no significant arrhythmias.  Previously had an EP study in 2019 with no evidence of accessory pathway or inducible SVT.  Patient was started on carvedilol , also takes Cardizem .  States feeling well, denies palpitations, dizziness, shortness of breath.  Prior notes/testing Echo 12/2020 EF 45 to 50% Echo 2019 at [facility] EF 55% CMR at [facility] 09/2017 EF 39%. Echo 2017 at [facility] EF 50%. Left heart cath 2017 and 0, no CAD.  Past Medical History:  Diagnosis Date   Arthritis    Glaucoma    Hypertension    Situational depression 06/07/2016   Wolff-Parkinson-White syndrome     Past Surgical History:  Procedure Laterality Date   EYE SURGERY      Current Medications: Current Meds  Medication Sig   aspirin  EC 81 MG tablet Take 1 tablet (81 mg total) by mouth daily. Swallow whole.   CARDIZEM  CD 180 MG 24 hr capsule Take 1 capsule (180 mg total) by mouth daily.   carvedilol  (COREG ) 6.25 MG tablet Take 1 tablet (6.25 mg total) by mouth 2 (two) times daily.   COSOPT 2-0.5 % ophthalmic solution 1 drop 2 (two) times daily.   fluticasone (FLONASE) 50 MCG/ACT nasal spray Place 1 spray into both nostrils daily.   ibuprofen  (ADVIL ) 600 MG tablet Take 1 tablet (600 mg total) by mouth every 6 (six) hours as needed.   latanoprost (XALATAN) 0.005 % ophthalmic solution Place 1 drop into both eyes at bedtime.    losartan  (COZAAR ) 50 MG tablet Take 1 tablet (50 mg total) by mouth daily.   meloxicam  (MOBIC ) 15 MG tablet Take 1 tablet (15 mg total) by mouth daily.   methocarbamol  (ROBAXIN ) 500 MG tablet Take 1 tablet (500 mg total) by mouth every 6 (six) hours as needed for muscle spasms.   rosuvastatin  (CRESTOR ) 10 MG tablet Take 1 tablet (10 mg total) by mouth daily.   sildenafil  (VIAGRA ) 100 MG tablet Take 1 tab 1 hour  prior to incourse   tamsulosin  (FLOMAX ) 0.4 MG CAPS capsule Take 2 tablets once a day     Allergies:   Patient has no known allergies.   Social History   Socioeconomic History   Marital status: Married    Spouse name: Caylor Tallarico   Number of children: 3   Years of education: 12   Highest education level: 12th grade  Occupational History   Not on file  Tobacco Use   Smoking status: Every Day    Current packs/day: 0.25    Average packs/day: 0.3 packs/day for 45.2 years (11.3 ttl pk-yrs)    Types: Cigarettes    Start date: 08/04/1978    Passive exposure: Current   Smokeless tobacco: Never   Tobacco comments:    5 cigarettes per day   Vaping Use   Vaping status: Never Used  Substance and Sexual Activity   Alcohol  use: Never   Drug use: Never   Sexual activity: Not Currently  Other Topics Concern   Not on file  Social History Narrative   3 children and 17 grandchildren and 5 great-grandchildren   Social Drivers of Health   Financial Resource Strain: Medium Risk (09/30/2023)   Overall Financial Resource Strain (CARDIA)    Difficulty of Paying Living Expenses: Somewhat hard  Food Insecurity: No Food Insecurity (09/30/2023)   Hunger Vital Sign    Worried About Running Out of Food in the Last Year: Never true    Ran Out of Food in the Last Year: Never true  Transportation Needs: No Transportation Needs (09/30/2023)   PRAPARE - Administrator, Civil Service (Medical): No    Lack of Transportation (Non-Medical): No  Physical Activity: Inactive (09/30/2023)    Exercise Vital Sign    Days of Exercise per Week: 0 days    Minutes of Exercise per Session: 0 min  Stress: No Stress Concern Present (09/30/2023)   [employer] of Occupational Health - Occupational Stress Questionnaire    Feeling of Stress : Only a little  Social Connections: Moderately Integrated (09/30/2023)   Social Connection and Isolation Panel [NHANES]    Frequency of Communication with Friends and Family: Twice a week    Frequency of Social Gatherings with Friends and Family: Once a week    Attends Religious Services: More than 4 times per year    Active Member of [employer] or Organizations: No    Attends Banker Meetings: Never    Marital Status: Married     Family History: The patient's family history includes Heart disease in his mother; Lupus in his maternal grandmother and sister.  ROS:   Please see the history of present illness.     All other systems reviewed and are negative.  EKGs/Labs/Other Studies Reviewed:    The following studies were reviewed today:  EKG Interpretation Date/Time:  Monday October 20 2023 09:57:31 EDT Ventricular Rate:  59 PR Interval:  160 QRS Duration:  92 QT Interval:  430 QTC Calculation: 425 R Axis:   30  Text Interpretation: Sinus bradycardia Minimal voltage criteria for LVH, may be normal variant ( Sokolow-Lyon ) Confirmed by Constancia Delton ([id number]) on 10/20/2023 10:13:11 AM    Recent Labs: 08/05/2023: B Natriuretic Peptide 25.1 10/06/2023: ALT 12; BUN 16; Creatinine, Ser 0.99; Hemoglobin 12.8; Platelets 193; Potassium 4.2; Sodium 139; TSH 0.933  Recent Lipid Panel    Component Value Date/Time   CHOL 112 10/06/2023 1531   TRIG 77 10/06/2023 1531   HDL 54 10/06/2023 1531   CHOLHDL 2.9 09/14/2021 0904   LDLCALC 42 10/06/2023 1531     Risk Assessment/Calculations:             Physical Exam:    VS:  BP 132/82 (BP Location: Left Arm, Patient Position: Sitting, Cuff Size: Normal)   Pulse (!) 59   Ht 5\' 7"  (1.702  m)   Wt 117 lb 12.8 oz (53.4 kg)   SpO2 99%   BMI 18.45 kg/m     Wt Readings from Last 3 Encounters:  10/20/23 117 lb 12.8 oz (53.4 kg)  10/06/23 119 lb 12.8 oz (54.3 kg)  09/30/23 118 lb (53.5 kg)     GEN:  Well nourished, well developed in no acute distress HEENT: Normal NECK: No JVD; No carotid bruits CARDIAC: RRR, no murmurs, rubs, gallops RESPIRATORY:  Clear to auscultation without rales, wheezing or rhonchi  ABDOMEN: Soft,  non-tender, non-distended MUSCULOSKELETAL:  No edema; No deformity  SKIN: Warm and dry NEUROLOGIC:  Alert and oriented x 3 PSYCHIATRIC:  Normal affect   ASSESSMENT:    1. NICM (nonischemic cardiomyopathy) (HCC)   2. Primary hypertension   3. Palpitations   4. Smoking greater than 40 pack years    PLAN:    In order of problems listed above:  Nonischemic cardiomyopathy, echo 2022 EF 45 to 50%.  Coreg  6.25 mg twice daily, losartan  50 mg daily.  Obtain echocardiogram.  If EF less than 50%, switch losartan  to Entresto.  Patient otherwise euvolemic, asymptomatic. Hypertension, BP controlled.  Continue Coreg , losartan , Cardizem . Palpitations, controlled with Cardizem  and Coreg .  S/p ILR, no significant sustained arrhythmias.  Continue follow-up as per EP. Current smoker, smoking cessation advised.  Follow-up in 3 months.     Medication Adjustments/Labs and Tests Ordered: Current medicines are reviewed at length with the patient today.  Concerns regarding medicines are outlined above.  Orders Placed This Encounter  Procedures   EKG 12-Lead   ECHOCARDIOGRAM COMPLETE   No orders of the defined types were placed in this encounter.   Patient Instructions  Medication Instructions:  The current medical regimen is effective;  continue present plan and medications as directed. Please refer to the Current Medication list given to you today.   *If you need a refill on your cardiac medications before your next appointment, please call your  pharmacy*  Testing/Procedures: Your physician has requested that you have an echocardiogram. Echocardiography is a painless test that uses sound waves to create images of your heart. It provides your doctor with information about the size and shape of your heart and how well your heart's chambers and valves are working.   You may receive an ultrasound enhancing agent through an IV if needed to better visualize your heart during the echo. This procedure takes approximately one hour.  There are no restrictions for this procedure.  This will take place at [address] [facility] [facility], [place] [address]  Please note: We ask at that you not bring children with you during ultrasound (echo/ vascular) testing. Due to room size and safety concerns, children are not allowed in the ultrasound rooms during exams. Our front office staff cannot provide observation of children in our lobby area while testing is being conducted. An adult accompanying a patient to their appointment will only be allowed in the ultrasound room at the discretion of the ultrasound technician under special circumstances. We apologize for any inconvenience.   Follow-Up: At [facility], you and your health needs are our priority.  As part of our continuing mission to provide you with exceptional heart care, our providers are all part of one team.  This team includes your primary Cardiologist (physician) and Advanced Practice Providers or APPs (Physician Assistants and Nurse Practitioners) who all work together to provide you with the care you need, when you need it.  Your next appointment:   2-3 month(s)  Provider:   You may see Dr.Agbor-Etang or one of the following Advanced Practice Providers on your designated Care Team:   Laneta Pintos, NP Gildardo Labrador, PA-C Varney Gentleman, PA-C Cadence Bar Nunn, PA-C Ronald Cockayne, NP Morey Ar, NP    We recommend signing up for the patient portal called  "MyChart".  Sign up information is provided on this After Visit Summary.  MyChart is used to connect with patients for Virtual Visits (Telemedicine).  Patients are able to view lab/test results, encounter  notes, upcoming appointments, etc.  Non-urgent messages can be sent to your provider as well.   To learn more about what you can do with MyChart, go to [email or web address].        Signed, Constancia Delton, MD  10/20/2023 10:46 AM    [facility]

## 2023-10-21 ENCOUNTER — Encounter: Payer: Self-pay | Admitting: Cardiology

## 2023-10-23 ENCOUNTER — Ambulatory Visit: Payer: Self-pay | Admitting: Licensed Clinical Social Worker

## 2023-10-23 NOTE — Patient Outreach (Signed)
 Complex Care Management   Visit Note  10/23/2023  Name:  Jesus Price MRN: [id number] DOB: January 09, 1961  Situation: Referral received for Complex Care Management related to SDOH Barriers:  Depression Family conflict  I obtained verbal consent from Patient.  Visit completed with Gray Layman  on the phone  Background:   Past Medical History:  Diagnosis Date   Arthritis    Glaucoma    Hypertension    Situational depression 06/07/2016   Wolff-Parkinson-White syndrome     Assessment: Patient Reported Symptoms:  Cognitive        Neurological      HEENT        Cardiovascular      Respiratory      Endocrine      Gastrointestinal        Genitourinary      Integumentary      Musculoskeletal          Psychosocial Psychosocial Symptoms Reported: Anxiety - if selected complete GAD     Do you feel physically threatened by others?: Yes      10/23/2023    9:51 AM  Depression screen PHQ 2/9  Decreased Interest 1  Down, Depressed, Hopeless 1  PHQ - 2 Score 2  Altered sleeping 1  Tired, decreased energy 1  Change in appetite 0  Feeling bad or failure about yourself  0  Trouble concentrating 0  Moving slowly or fidgety/restless 0  Suicidal thoughts 0  PHQ-9 Score 4    There were no vitals filed for this visit.  Medications Reviewed Today     Reviewed by Fletcher Humble, LCSW (Social Worker) on 10/23/23 at 1409  Med List Status: <None>   Medication Order Taking? Sig Documenting Provider Last Dose Status Informant  aspirin  EC 81 MG tablet [id number] No Take 1 tablet (81 mg total) by mouth daily. Swallow whole. Doran Galloway T, NP Taking Active   CARDIZEM  CD 180 MG 24 hr capsule [id number] No Take 1 capsule (180 mg total) by mouth daily. Doran Galloway T, NP Taking Active   carvedilol  (COREG ) 6.25 MG tablet [id number] No Take 1 tablet (6.25 mg total) by mouth 2 (two) times daily. Riddle, Suzann, NP Taking Active   COSOPT 2-0.5 % ophthalmic solution [id number]  No 1 drop 2 (two) times daily. [provider] Taking Active   fluticasone (FLONASE) 50 MCG/ACT nasal spray [id number] No Place 1 spray into both nostrils daily. [provider] Taking Active   ibuprofen  (ADVIL ) 600 MG tablet [id number] No Take 1 tablet (600 mg total) by mouth every 6 (six) hours as needed. Wellington Half, NP Taking Active   latanoprost (XALATAN) 0.005 % ophthalmic solution [id number] No Place 1 drop into both eyes at bedtime. [provider] Taking Active   losartan  (COZAAR ) 50 MG tablet [id number] No Take 1 tablet (50 mg total) by mouth daily. Cannady, Jolene T, NP Taking Active   meloxicam  (MOBIC ) 15 MG tablet [id number] No Take 1 tablet (15 mg total) by mouth daily. Cannady, Jolene T, NP Taking Active   methocarbamol  (ROBAXIN ) 500 MG tablet [id number] No Take 1 tablet (500 mg total) by mouth every 6 (six) hours as needed for muscle spasms. Cannady, Jolene T, NP Taking Active   rosuvastatin  (CRESTOR ) 10 MG tablet [id number] No Take 1 tablet (10 mg total) by mouth daily. Doran Galloway T, NP Taking Active   sildenafil  (VIAGRA ) 100 MG tablet [id number] No Take 1 tab 1 hour  prior to [employer], Scott C,  MD Taking Active   tamsulosin  (FLOMAX ) 0.4 MG CAPS capsule [id number] No Take 2 tablets once a day Stoioff, Kizzie Perks, MD Taking Active             Recommendation:   Avoid contact with family conflict, contact [facility] for counseling and communicate needs with spouse   Follow Up Plan:   Telephone follow-up 11/06/2023  Fletcher Humble MSW, LCSW Licensed Clinical Social Worker  [facility], Population Health Direct Dial: [phone]  Fax: [phone]

## 2023-10-23 NOTE — Patient Instructions (Signed)
 Visit Information  Thank you for taking time to visit with me today. Please don't hesitate to contact me if I can be of assistance to you before our next scheduled appointment.  Your next care management appointment is by telephone on 11/06/2023 at 11am  Face to Face appointment date/time: 11/06/2023  Please call the care guide team at [phone] if you need to cancel, schedule, or reschedule an appointment.   Please call 911 if you are experiencing a Mental Health or Behavioral Health Crisis or need someone to talk to.  Fletcher Humble MSW, LCSW Licensed Clinical Social Worker  [facility], Population Health Direct Dial: [phone]  Fax: [phone]

## 2023-10-31 ENCOUNTER — Ambulatory Visit (INDEPENDENT_AMBULATORY_CARE_PROVIDER_SITE_OTHER): Admitting: Podiatry

## 2023-10-31 DIAGNOSIS — Z91199 Patient's noncompliance with other medical treatment and regimen due to unspecified reason: Secondary | ICD-10-CM

## 2023-10-31 NOTE — Progress Notes (Signed)
   Complete physical exam  Patient: Jesus Price   DOB: 04/20/2001   63 y.o. Male  MRN: [id number]  Subjective:    No chief complaint on file.   Jesus Price is a 63 y.o. male who presents today for a complete physical exam. She reports consuming a {diet types:17450} diet. {types:19826} She generally feels {DESC; WELL/FAIRLY WELL/POORLY:18703}. She reports sleeping {DESC; WELL/FAIRLY WELL/POORLY:18703}. She {does/does not:200015} have additional problems to discuss today.    Most recent fall risk assessment:    12/26/2021   10:42 AM  Fall Risk   Falls in the past year? 0  Number falls in past yr: 0  Injury with Fall? 0  Risk for fall due to : No Fall Risks  Follow up Falls evaluation completed     Most recent depression screenings:    12/26/2021   10:42 AM 11/16/2020   10:46 AM  PHQ 2/9 Scores  PHQ - 2 Score 0 0  PHQ- 9 Score 5     {VISON DENTAL STD PSA (Optional):27386}  {History (Optional):23778}  Patient Care Team: Christen Butter, NP as PCP - General (Nurse Practitioner)   Outpatient Medications Prior to Visit  Medication Sig   fluticasone (FLONASE) 50 MCG/ACT nasal spray Place 2 sprays into both nostrils in the morning and at bedtime. After 7 days, reduce to once daily.   norgestimate-ethinyl estradiol (SPRINTEC 28) 0.25-35 MG-MCG tablet Take 1 tablet by mouth daily.   Nystatin POWD Apply liberally to affected area 2 times per day   spironolactone (ALDACTONE) 100 MG tablet Take 1 tablet (100 mg total) by mouth daily.   No facility-administered medications prior to visit.    ROS        Objective:     There were no vitals taken for this visit. {Vitals History (Optional):23777}  Physical Exam   No results found for any visits on 01/31/22. {Show previous labs (optional):23779}    Assessment & Plan:    Routine Health Maintenance and Physical Exam  Immunization History  Administered Date(s) Administered   DTaP 07/04/1999, 08/30/1999,  11/08/1999, 07/24/2000, 02/07/2004   Hepatitis A 12/04/2007, 12/09/2008   Hepatitis B 04/21/1999, 05/29/1999, 11/08/1999   HiB (PRP-OMP) 07/04/1999, 08/30/1999, 11/08/1999, 07/24/2000   IPV 07/04/1999, 08/30/1999, 04/28/2000, 02/07/2004   Influenza,inj,Quad PF,6+ Mos 03/11/2014   Influenza-Unspecified 06/10/2012   MMR 04/28/2001, 02/07/2004   Meningococcal Polysaccharide 12/09/2011   Pneumococcal Conjugate-13 07/24/2000   Pneumococcal-Unspecified 11/08/1999, 01/22/2000   Tdap 12/09/2011   Varicella 04/28/2000, 12/04/2007    Health Maintenance  Topic Date Due   HIV Screening  Never done   Hepatitis C Screening  Never done   INFLUENZA VACCINE  01/29/2022   PAP-Cervical Cytology Screening  01/31/2022 (Originally 04/19/2020)   PAP SMEAR-Modifier  01/31/2022 (Originally 04/19/2020)   TETANUS/TDAP  01/31/2022 (Originally 12/08/2021)   HPV VACCINES  Discontinued   COVID-19 Vaccine  Discontinued    Discussed health benefits of physical activity, and encouraged her to engage in regular exercise appropriate for her age and condition.  Problem List Items Addressed This Visit   None Visit Diagnoses     Annual physical exam    -  Primary   Cervical cancer screening       Need for Tdap vaccination          No follow-ups on file.     Christen Butter, NP

## 2023-11-04 ENCOUNTER — Other Ambulatory Visit: Payer: Self-pay | Admitting: Licensed Clinical Social Worker

## 2023-11-04 NOTE — Patient Outreach (Signed)
 Complex Care Management   Visit Note  11/04/2023  Name:  Jesus Price MRN: [id number] DOB: [birth year]  Situation: Referral received for Complex Care Management related to Menta/Behavioral Health diagnosis anxiety  I obtained verbal consent from Patient.  Visit completed with Mr and Mrs. Orvil Bland  on the phone  Background:   Past Medical History:  Diagnosis Date   Arthritis    Glaucoma    Hypertension    Situational depression 06/07/2016   Wolff-Parkinson-White syndrome     Assessment: Patient Reported Symptoms:  Cognitive        Neurological      HEENT        Cardiovascular      Respiratory      Endocrine      Gastrointestinal        Genitourinary      Integumentary      Musculoskeletal          Psychosocial              10/23/2023    9:51 AM  Depression screen PHQ 2/9  Decreased Interest 1  Down, Depressed, Hopeless 1  PHQ - 2 Score 2  Altered sleeping 1  Tired, decreased energy 1  Change in appetite 0  Feeling bad or failure about yourself  0  Trouble concentrating 0  Moving slowly or fidgety/restless 0  Suicidal thoughts 0  PHQ-9 Score 4    There were no vitals filed for this visit.  Medications Reviewed Today     Reviewed by Fletcher Humble, LCSW (Social Worker) on 11/04/23 at 1632  Med List Status: <None>   Medication Order Taking? Sig Documenting Provider Last Dose Status Informant  aspirin  EC 81 MG tablet [id number] No Take 1 tablet (81 mg total) by mouth daily. Swallow whole. Doran Galloway T, NP Taking Active   CARDIZEM  CD 180 MG 24 hr capsule [id number] No Take 1 capsule (180 mg total) by mouth daily. Doran Galloway T, NP Taking Active   carvedilol  (COREG ) 6.25 MG tablet [id number] No Take 1 tablet (6.25 mg total) by mouth 2 (two) times daily. Riddle, Suzann, NP Taking Active   COSOPT 2-0.5 % ophthalmic solution [id number] No 1 drop 2 (two) times daily. [provider] Taking Active   fluticasone (FLONASE) 50 MCG/ACT  nasal spray [id number] No Place 1 spray into both nostrils daily. [provider] Taking Active   ibuprofen  (ADVIL ) 600 MG tablet [id number] No Take 1 tablet (600 mg total) by mouth every 6 (six) hours as needed. Wellington Half, NP Taking Active   latanoprost (XALATAN) 0.005 % ophthalmic solution [id number] No Place 1 drop into both eyes at bedtime. [provider] Taking Active   losartan  (COZAAR ) 50 MG tablet [id number] No Take 1 tablet (50 mg total) by mouth daily. Cannady, Jolene T, NP Taking Active   meloxicam  (MOBIC ) 15 MG tablet [id number] No Take 1 tablet (15 mg total) by mouth daily. Cannady, Jolene T, NP Taking Active   methocarbamol  (ROBAXIN ) 500 MG tablet [id number] No Take 1 tablet (500 mg total) by mouth every 6 (six) hours as needed for muscle spasms. Cannady, Jolene T, NP Taking Active   rosuvastatin  (CRESTOR ) 10 MG tablet [id number] No Take 1 tablet (10 mg total) by mouth daily. Doran Galloway T, NP Taking Active   sildenafil  (VIAGRA ) 100 MG tablet [id number] No Take 1 tab 1 hour  prior to incourse Stoioff, Kizzie Perks, MD Taking Active   tamsulosin  (FLOMAX ) 0.4 MG CAPS capsule [id number] No  Take 2 tablets once a day Stoioff, Kizzie Perks, MD Taking Active             Recommendation:   Continue to engage in self care, communicate needs with spouse   Follow Up Plan:   Telephone follow up appointment date/time:  11/19/2023   Fletcher Humble MSW, LCSW Licensed Clinical Social Worker  [facility], Population Health Direct Dial: [phone]  Fax: [phone]

## 2023-11-04 NOTE — Patient Instructions (Signed)
 Visit Information  Thank you for taking time to visit with me today. Please don't hesitate to contact me if I can be of assistance to you before our next scheduled appointment.  Your next care management appointment is by telephone on 11/19/2023 at 1pm  Telephone follow up appointment date/time:  11/19/2023  Please call the care guide team at [phone] if you need to cancel, schedule, or reschedule an appointment.   Please call 911 if you are experiencing a Mental Health or Behavioral Health Crisis or need someone to talk to.  Fletcher Humble MSW, LCSW Licensed Clinical Social Worker  [facility], Population Health Direct Dial: [phone]  Fax: [phone]

## 2023-11-05 NOTE — Progress Notes (Signed)
 Carelink Summary Report / Loop Recorder

## 2023-11-05 NOTE — Addendum Note (Signed)
 Addended by: Edra Govern D on: 11/05/2023 04:24 PM   Modules accepted: Orders

## 2023-11-06 ENCOUNTER — Telehealth: Payer: Self-pay | Admitting: Licensed Clinical Social Worker

## 2023-11-08 NOTE — Patient Instructions (Signed)

## 2023-11-10 ENCOUNTER — Encounter: Payer: Self-pay | Admitting: Nurse Practitioner

## 2023-11-10 ENCOUNTER — Ambulatory Visit (INDEPENDENT_AMBULATORY_CARE_PROVIDER_SITE_OTHER): Admitting: Nurse Practitioner

## 2023-11-10 ENCOUNTER — Telehealth: Payer: Self-pay | Admitting: Licensed Clinical Social Worker

## 2023-11-10 VITALS — BP 106/58 | HR 64 | Temp 98.0°F | Ht 67.0 in | Wt 118.8 lb

## 2023-11-10 DIAGNOSIS — F17200 Nicotine dependence, unspecified, uncomplicated: Secondary | ICD-10-CM

## 2023-11-10 DIAGNOSIS — I10 Essential (primary) hypertension: Secondary | ICD-10-CM

## 2023-11-10 NOTE — Assessment & Plan Note (Signed)
 Chronic, stable.  BP well below goal today, on lower side, and stable levels at home.  Attempted reduction of Losartan  to 50 MG, but he did not tolerate this.  Recommend he monitor BP at least a few mornings a week at home and document.  DASH diet at home.  Continue current medication regimen and adjust as needed. Labs today: up to date.  Urine ALB 80 March 2025, continue Losartan  for kidney protection.

## 2023-11-10 NOTE — Assessment & Plan Note (Signed)
 I have recommended complete cessation of tobacco use. I have discussed various options available for assistance with tobacco cessation including over the counter methods (Nicotine gum, patch and lozenges). We also discussed prescription options (Chantix, Nicotine Inhaler / Nasal Spray). The patient is not interested in pursuing any prescription tobacco cessation options at this time.

## 2023-11-10 NOTE — Progress Notes (Signed)
 BP (!) 106/58   Pulse 64   Temp 98 F (36.7 C) (Oral)   Ht 5\' 7"  (1.702 m)   Wt 118 lb 12.8 oz (53.9 kg)   SpO2 95%   BMI 18.61 kg/m    Subjective:    Patient ID: Jesus Price, male    DOB: [birth year], 63 y.o.   MRN: [id number]  HPI: Jesus Price is a 63 y.o. male  Chief Complaint  Patient presents with   Hypertension    Patient states he went back to taking the 100 mg tablet, states he tried reducing to 50 mg as directed but states it was giving him a headache.    HYPERTENSION without Chronic Kidney Disease Follow-up today for medication changes on 10/06/23 when reduced Losartan  to 50 MG.  He returned to 100 MG as when he tried to reduce he was getting headaches.  Saw cardiology last on 10/20/23 with plan for echo repeat and medication changes if reduced EF. Continues to smoke two cigarettes a day, unless something gets on his nerves. Hypertension status: stable  Satisfied with current treatment? yes Duration of hypertension: chronic BP monitoring frequency:  weekly BP range: 100-110/60-70 BP medication side effects:  no Medication compliance: good compliance Aspirin : yes Recurrent headaches: no Visual changes: no Palpitations: no Dyspnea: present at baseline Chest pain: rarely, every now and then Lower extremity edema: no Dizzy/lightheaded: no   Relevant past medical, surgical, family and social history reviewed and updated as indicated. Interim medical history since our last visit reviewed. Allergies and medications reviewed and updated.  Review of Systems  Constitutional:  Negative for activity change, diaphoresis, fatigue and fever.  Respiratory:  Positive for shortness of breath (at baseline). Negative for cough, chest tightness and wheezing.   Cardiovascular:  Positive for chest pain (rarely). Negative for palpitations and leg swelling.  Gastrointestinal: Negative.   Neurological: Negative.   Psychiatric/Behavioral:  Negative for decreased  concentration, self-injury, sleep disturbance and suicidal ideas. The patient is not nervous/anxious.    Per HPI unless specifically indicated above     Objective:     BP (!) 106/58   Pulse 64   Temp 98 F (36.7 C) (Oral)   Ht 5\' 7"  (1.702 m)   Wt 118 lb 12.8 oz (53.9 kg)   SpO2 95%   BMI 18.61 kg/m   Wt Readings from Last 3 Encounters:  11/10/23 118 lb 12.8 oz (53.9 kg)  10/20/23 117 lb 12.8 oz (53.4 kg)  10/06/23 119 lb 12.8 oz (54.3 kg)    Physical Exam Vitals and nursing note reviewed.  Constitutional:      General: He is awake. He is not in acute distress.    Appearance: He is well-developed, well-groomed and underweight. He is not ill-appearing or toxic-appearing.  HENT:     Head: Normocephalic.     Right Ear: Hearing and external ear normal.     Left Ear: Hearing and external ear normal.  Eyes:     General: Lids are normal.     Extraocular Movements: Extraocular movements intact.     Conjunctiva/sclera: Conjunctivae normal.  Neck:     Thyroid : No thyromegaly.     Vascular: No carotid bruit.  Cardiovascular:     Rate and Rhythm: Normal rate and regular rhythm.     Heart sounds: Normal heart sounds. No murmur heard.    No gallop.  Pulmonary:     Effort: No accessory muscle usage or respiratory distress.     Breath  sounds: Normal breath sounds.  Abdominal:     General: Bowel sounds are normal. There is no distension.     Palpations: Abdomen is soft.     Tenderness: There is no abdominal tenderness.  Musculoskeletal:     Cervical back: Full passive range of motion without pain.     Right lower leg: No edema.     Left lower leg: No edema.  Lymphadenopathy:     Cervical: No cervical adenopathy.  Skin:    General: Skin is warm.     Capillary Refill: Capillary refill takes less than 2 seconds.  Neurological:     Mental Status: He is alert and oriented to person, place, and time.     Deep Tendon Reflexes: Reflexes are normal and symmetric.     Reflex  Scores:      Brachioradialis reflexes are 2+ on the right side and 2+ on the left side.      Patellar reflexes are 2+ on the right side and 2+ on the left side. Psychiatric:        Attention and Perception: Attention normal.        Mood and Affect: Mood normal.        Speech: Speech normal.        Behavior: Behavior normal. Behavior is cooperative.        Thought Content: Thought content normal.     Results for orders placed or performed in visit on 10/20/23  CUP PACEART REMOTE DEVICE CHECK   Collection Time: 10/19/23 11:09 PM  Result Value Ref Range   Date Time Interrogation Session [id number]    Pulse Generator Manufacturer MERM    Pulse Gen Model LNQ22 LINQ II    Pulse Gen Serial Number [id number]    Clinic [facility]    Implantable Pulse Generator Type ICM/ILR    Implantable Pulse Generator Implant Date [id number]       Assessment & Plan:   Problem List Items Addressed This Visit       Cardiovascular and Mediastinum   Essential hypertension - Primary   Chronic, stable.  BP well below goal today, on lower side, and stable levels at home.  Attempted reduction of Losartan  to 50 MG, but he did not tolerate this.  Recommend he monitor BP at least a few mornings a week at home and document.  DASH diet at home.  Continue current medication regimen and adjust as needed. Labs today: up to date.  Urine ALB 80 March 2025, continue Losartan  for kidney protection.        Relevant Medications   losartan  (COZAAR ) 100 MG tablet     Other   Nicotine dependence with current use   I have recommended complete cessation of tobacco use. I have discussed various options available for assistance with tobacco cessation including over the counter methods (Nicotine gum, patch and lozenges). We also discussed prescription options (Chantix, Nicotine Inhaler / Nasal Spray). The patient is not interested in pursuing any prescription tobacco cessation options at this time.         Follow  up plan: Return in about 6 months (around 04/30/2024) for HTN/HLD, Depression, ANXIETY, WPW.

## 2023-11-20 ENCOUNTER — Ambulatory Visit: Attending: Cardiology

## 2023-11-20 DIAGNOSIS — I428 Other cardiomyopathies: Secondary | ICD-10-CM | POA: Diagnosis not present

## 2023-11-20 LAB — ECHOCARDIOGRAM COMPLETE
AR max vel: 3.62 cm2
AV Area VTI: 3.79 cm2
AV Area mean vel: 3.35 cm2
AV Mean grad: 2 mmHg
AV Peak grad: 3.1 mmHg
Ao pk vel: 0.88 m/s
Area-P 1/2: 3.85 cm2
S' Lateral: 3.77 cm

## 2023-11-24 ENCOUNTER — Ambulatory Visit: Payer: Self-pay | Admitting: Cardiology

## 2023-11-25 ENCOUNTER — Other Ambulatory Visit: Payer: Self-pay

## 2023-11-25 ENCOUNTER — Ambulatory Visit (INDEPENDENT_AMBULATORY_CARE_PROVIDER_SITE_OTHER): Payer: Medicare HMO

## 2023-11-25 DIAGNOSIS — I428 Other cardiomyopathies: Secondary | ICD-10-CM

## 2023-11-25 DIAGNOSIS — R002 Palpitations: Secondary | ICD-10-CM

## 2023-11-25 LAB — CUP PACEART REMOTE DEVICE CHECK
Date Time Interrogation Session: 20250526231323
Implantable Pulse Generator Implant Date: 20221228

## 2023-11-25 MED ORDER — ENTRESTO 49-51 MG PO TABS: 1.0000 | ORAL_TABLET | Freq: Two times a day (BID) | ORAL | 3 refills | Status: DC

## 2023-11-26 ENCOUNTER — Telehealth: Payer: Self-pay | Admitting: Cardiology

## 2023-11-26 ENCOUNTER — Ambulatory Visit: Payer: Self-pay | Admitting: Cardiology

## 2023-11-26 NOTE — Telephone Encounter (Signed)
 Spoke with patient. Patient states that Entresto is going to cost him $200 out of pocket and his is unable to afford the medication. Will forward to patient assistance to see if he qualifies.

## 2023-11-26 NOTE — Telephone Encounter (Signed)
 Pt c/o medication issue:  1. Name of Medication: sacubitril-valsartan (ENTRESTO ) 49-51 MG   2. How are you currently taking this medication (dosage and times per day)?    3. Are you having a reaction (difficulty breathing--STAT)? no  4. What is your medication issue? Patient states the medication is too expensive. Please advise

## 2023-11-28 ENCOUNTER — Other Ambulatory Visit: Payer: Self-pay

## 2023-11-28 MED ORDER — LOSARTAN POTASSIUM 50 MG PO TABS: 50.0000 mg | ORAL_TABLET | Freq: Every day | ORAL | 3 refills | Status: DC

## 2023-11-29 ENCOUNTER — Other Ambulatory Visit: Payer: Self-pay | Admitting: Cardiology

## 2023-11-29 DIAGNOSIS — I10 Essential (primary) hypertension: Secondary | ICD-10-CM

## 2023-12-02 ENCOUNTER — Encounter: Payer: Self-pay | Admitting: Cardiology

## 2023-12-02 ENCOUNTER — Ambulatory Visit: Payer: Medicare HMO | Attending: Cardiology | Admitting: Cardiology

## 2023-12-02 VITALS — BP 144/88 | HR 74 | Ht 67.0 in | Wt 118.2 lb

## 2023-12-02 DIAGNOSIS — I5022 Chronic systolic (congestive) heart failure: Secondary | ICD-10-CM | POA: Diagnosis not present

## 2023-12-02 DIAGNOSIS — I493 Ventricular premature depolarization: Secondary | ICD-10-CM

## 2023-12-02 DIAGNOSIS — I428 Other cardiomyopathies: Secondary | ICD-10-CM | POA: Diagnosis not present

## 2023-12-02 DIAGNOSIS — I10 Essential (primary) hypertension: Secondary | ICD-10-CM | POA: Diagnosis not present

## 2023-12-02 DIAGNOSIS — Z95818 Presence of other cardiac implants and grafts: Secondary | ICD-10-CM | POA: Diagnosis not present

## 2023-12-02 DIAGNOSIS — Z79899 Other long term (current) drug therapy: Secondary | ICD-10-CM

## 2023-12-02 NOTE — Patient Instructions (Signed)
 Medication Instructions:  Your physician recommends that you continue on your current medications as directed. Please refer to the Current Medication list given to you today.    I will message our Medication Assistance Team about seeing if we can make the Entresto  affordable.   *If you need a refill on your cardiac medications before your next appointment, please call your pharmacy*  Lab Work: None ordered at this time  If you have labs (blood work) drawn today and your tests are completely normal, you will receive your results only by: MyChart Message (if you have MyChart) OR A paper copy in the mail If you have any lab test that is abnormal or we need to change your treatment, we will call you to review the results.  Testing/Procedures: None ordered at this time   Follow-Up: At [facility], you and your health needs are our priority.  As part of our continuing mission to provide you with exceptional heart care, our providers are all part of one team.  This team includes your primary Cardiologist (physician) and Advanced Practice Providers or APPs (Physician Assistants and Nurse Practitioners) who all work together to provide you with the care you need, when you need it.  Your next appointment:   With Odessa Bene, NP, in a couple of weeks to discuss the Entresto  effectiveness.  We will need to cancel or reschedule this appointment if the medication doesn't change.  Provider:   Laneta Pintos, NP

## 2023-12-02 NOTE — Progress Notes (Signed)
 Electrophysiology Clinic Note    Date:  12/02/2023  Patient ID:  Jesus, Price 01-28-1961, MRN [id number] PCP:  Lemar Pyles, NP  Cardiologist:  None Electrophysiologist: Boyce Byes, MD   Discussed the use of AI scribe software for clinical note transcription with the patient, who gave verbal consent to proceed.   Patient Profile    Chief Complaint: ILR follow-up  History of Present Illness: Jesus Price is a 63 y.o. male with PMH notable for NICM, HTN, palpitations, tobacco use, family h/o sudden cardiac death ; seen today for Boyce Byes, MD for routine electrophysiology followup.   He was noted to have short PR interval, s/p EP study 2019. No accessory pathway or inducible SVT. ILR implanted at that time. Has since had ILR explanted and re-implanted.   I last saw him 10/2022 where he was having rare palpitation episodes, very manageable. He saw Dr. Junnie Olives 09/2023, recommended updated TTE which showed LVEF 45-50% and switched losartan  > entresto . Entresto  was cost-prohibitive, so switched back to losartan .  On follow-up today, he continues to have symptomatic chest pain episodes, described as quick on-quick off sensation where his breathing catches. He only notices the episodes during the quite moments of life. The sensation is deep in his chest but does not last long.  He denies swelling in his legs.  He checks blood pressure infrequently, most readings 110s to 130s systolic.  He is not sure of his losartan  dose.  He recalls that his PCP attempted to reduce the dose and he developed headaches so it was resumed at his previous dose.  He is unsure if it is 10 mg or 100 mg.    Arrhythmia/Device History MDT ILR, imp 05/2021   ROS:  Please see the history of present illness. All other systems are reviewed and otherwise negative.    Physical Exam    VS:  BP (!) 144/88   Pulse 74   Ht 5\' 7"  (1.702 m)   Wt 118 lb 3.2 oz (53.6 kg)   SpO2 99%    BMI 18.51 kg/m  BMI: Body mass index is 18.51 kg/m.  Vitals:   12/02/23 0230 12/02/23 1414  BP: (!) 150/84 (!) 144/88  Pulse:  74  Height:  5\' 7"  (1.702 m)  Weight:  118 lb 3.2 oz (53.6 kg)  SpO2:  99%  BMI (Calculated):  18.51     Wt Readings from Last 3 Encounters:  12/02/23 118 lb 3.2 oz (53.6 kg)  11/10/23 118 lb 12.8 oz (53.9 kg)  10/20/23 117 lb 12.8 oz (53.4 kg)     GEN- The patient is well appearing, alert and oriented x 3 today.   Lungs- Clear to ausculation bilaterally, normal work of breathing.  Heart- Regular rate and rhythm with ectopy,  no murmurs, rubs or gallops Extremities- No peripheral edema, warm, dry Skin-  ILR site well-healed    6/3 remote ILR interrogation done today and reviewed by myself:  Battery good PVC burden 0.9%   Studies Reviewed   Previous EP, cardiology notes.    EKG is not ordered. Personal review of EKG from 10/20/2023 shows:  SR at 59bpm, LVH        TTE, 11/20/2023 1. Left ventricular ejection fraction, by estimation, is 45 to 50%. The left ventricle has mildly decreased function. The left ventricle has no regional wall motion abnormalities. Left ventricular diastolic parameters were normal.   2. Right ventricular systolic function is normal. The right  ventricular size is normal.   3. The mitral valve is normal in structure. No evidence of mitral valve regurgitation.   4. The aortic valve is tricuspid. Aortic valve regurgitation is not visualized.   5. The inferior vena cava is normal in size with greater than 50% respiratory variability, suggesting right atrial pressure of 3 mmHg.     Assessment and Plan     #) PVC #) chest discomfort #) ILR iin situ Low PVC burden via ILR Reassurance provided Continue 6.25mg  coreg  BID   #) HFmrEF #) NICM #) HTN Recent TTE with chronic LVEF 45-50% Attempted to switch to entresto , which was cost prohibitive Will send to pharm assistance to attempt manufacturer's  assistance Continue coreg  as above Consider jardiance and/or spiro at follow-up Asked patient to monitor BP regularly at home and bring BP log to follow-up appts He is unsure of his losartan  dose, I've asked him to call clinic to update us  when he returns home.          Current medicines are reviewed at length with the patient today.   The patient has concerns regarding his medicines.  The following changes were made today:  none  Labs/ tests ordered today include:  No orders of the defined types were placed in this encounter.    Disposition: Follow up with Dr. Marven Slimmer or EP APP in 12 months  He has close follow-up scheduled with gen cards for further GDMT mgmt   Signed, Adaline Holly, NP  12/02/23  3:07 PM  Electrophysiology [facility]

## 2023-12-03 ENCOUNTER — Other Ambulatory Visit: Payer: Self-pay

## 2023-12-03 ENCOUNTER — Telehealth: Payer: Self-pay

## 2023-12-03 ENCOUNTER — Telehealth: Payer: Self-pay | Admitting: Pharmacy Technician

## 2023-12-03 ENCOUNTER — Other Ambulatory Visit (HOSPITAL_COMMUNITY): Payer: Self-pay

## 2023-12-03 NOTE — Telephone Encounter (Signed)
 Patient Advocate Encounter   The patient was approved for a [employer] grant that will help cover the cost of Entresto  Total amount awarded, 4500.00.  Effective: 11/03/23 - 11/01/24   [id number] WUJ:[id number] WGNFA:[id number] [id number] [employer] ID: [id number]   Pharmacy provided with approval and processing information. Patient informed via mychart

## 2023-12-03 NOTE — Telephone Encounter (Signed)
Updated patient medication list

## 2023-12-04 MED ORDER — ENTRESTO 49-51 MG PO TABS: 1.0000 | ORAL_TABLET | Freq: Two times a day (BID) | ORAL | 11 refills | Status: DC

## 2023-12-04 NOTE — Telephone Encounter (Signed)
 Suzann and Julie:  See message from Med Assist team - pt got $4500 in assistance   Pt was taking Losartan  100 mg daily - started Entresto  49-51 mg BID, Losartan  DC'd and new script sent to pharmacy, BMeT ordered and scheduled for 1 week post starting Entresto , pt called and plan discussed and MyChart messaged for follow-up

## 2023-12-04 NOTE — Telephone Encounter (Signed)
-----   Message from Lynett Sarah sent at 12/03/2023 12:03 PM EDT ----- Regarding: RE: Request for Entresto  assist Hi! Was able to get a grant and The grant is approved and the Pharmacy and the patient are aware. Thank you! ----- Message ----- From: Devon Fogo, RN Sent: 12/03/2023   8:41 AM EDT To: Lorriane Rote, LPN; Rx Med Assistance Team Subject: Request for Entresto  assist                    Hello, This pt was set-up for Entresto  49-51 BID during a visit with [facility] earlier this year but because of the cost the doc just said to keep the pt on losartan .  Suzann Riddle saw pt yesterday and really wants the pt on Entresto .    I spoke to the pt about this and he reports to be on disability so hopefully there's a good chance he can receive this med for little cost. I told the pt your office would be reaching out for income verification to assist him and he was receptive.  Please let me know if I can do anything to assist you.  The primary nurse is Lorriane Rote and I'll include her on this message.  Thank you, Dovie Gell

## 2023-12-04 NOTE — Telephone Encounter (Signed)
 Noted, thank you

## 2023-12-10 NOTE — Progress Notes (Signed)
 Carelink Summary Report / Loop Recorder

## 2023-12-12 ENCOUNTER — Other Ambulatory Visit: Payer: Self-pay | Admitting: Emergency Medicine

## 2023-12-12 DIAGNOSIS — Z79899 Other long term (current) drug therapy: Secondary | ICD-10-CM

## 2023-12-13 ENCOUNTER — Ambulatory Visit: Payer: Self-pay | Admitting: Cardiology

## 2023-12-13 LAB — BASIC METABOLIC PANEL WITH GFR
BUN/Creatinine Ratio: 17 (ref 10–24)
BUN: 17 mg/dL (ref 8–27)
CO2: 19 mmol/L — ABNORMAL LOW (ref 20–29)
Calcium: 8.9 mg/dL (ref 8.6–10.2)
Chloride: 102 mmol/L (ref 96–106)
Creatinine, Ser: 1.02 mg/dL (ref 0.76–1.27)
Glucose: 71 mg/dL (ref 70–99)
Potassium: 4.3 mmol/L (ref 3.5–5.2)
Sodium: 139 mmol/L (ref 134–144)
eGFR: 82 mL/min/{1.73_m2} (ref >59)

## 2023-12-15 ENCOUNTER — Other Ambulatory Visit: Payer: Self-pay | Admitting: Cardiology

## 2023-12-24 ENCOUNTER — Ambulatory Visit: Attending: Nurse Practitioner | Admitting: Nurse Practitioner

## 2023-12-24 ENCOUNTER — Other Ambulatory Visit: Payer: Self-pay | Admitting: Cardiology

## 2023-12-24 ENCOUNTER — Encounter: Payer: Self-pay | Admitting: Nurse Practitioner

## 2023-12-24 VITALS — BP 118/70 | HR 71 | Ht 67.0 in | Wt 117.2 lb

## 2023-12-24 DIAGNOSIS — I5022 Chronic systolic (congestive) heart failure: Secondary | ICD-10-CM

## 2023-12-24 DIAGNOSIS — Z720 Tobacco use: Secondary | ICD-10-CM | POA: Diagnosis not present

## 2023-12-24 DIAGNOSIS — I493 Ventricular premature depolarization: Secondary | ICD-10-CM | POA: Diagnosis not present

## 2023-12-24 DIAGNOSIS — I428 Other cardiomyopathies: Secondary | ICD-10-CM | POA: Diagnosis not present

## 2023-12-24 DIAGNOSIS — R002 Palpitations: Secondary | ICD-10-CM

## 2023-12-24 DIAGNOSIS — I10 Essential (primary) hypertension: Secondary | ICD-10-CM

## 2023-12-24 NOTE — Progress Notes (Signed)
 Office Visit    Patient Name: Jesus Price Date of Encounter: 12/24/2023  Primary Care Provider:  Valerio Melanie DASEN, NP Primary Cardiologist:  Jesus Cave, MD/ C. Cindie, MD - EP  Chief Complaint    63 y.o. 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  Past Medical History   Subjective   Past Medical History:  Diagnosis Date   Arthritis    Family history of sudden cardiac death    Glaucoma    Heart failure with mid-range ejection fraction (HCC)    a. 10/2023 Echo: EF 45-50%, no rwma, nl RV size/fxn.   Hypertension    Implantable loop recorder present    a. MDT Linq - placed 09/2017.   NICM (nonischemic cardiomyopathy) (HCC)    a. 2012 Echo: EF 40%; b. 05/2016 Cath: nl cors; c. 05/2016 Echo: EF 50%; d. 09/2017 c[facility]): EF 39%, nl RV size/fxn, no signif valvular dzs. No scar/infiltrative process; e. 12/2020 Echo:  EF 45-50%; f. 10/2023 Echo: EF 45-50%, no rwma, nl RV size/fxn.   Shortened PR interval w/ palpitations    a. 09/2017 EP Study [facility]): no evidence of dual AV nodal physiology/VA conduction/accessory pathway/inducible SVT.  MDT Linq placed.   Situational depression 06/07/2016   Past Surgical History:  Procedure Laterality Date   EYE SURGERY      Allergies  No Known Allergies     History of Present Illness      63 y.o. 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  He was previously followed at [facility] and subsequently at [facility].  He was initially diagnosed with nonischemic cardiomyopathy in 2012, at which time EF was reportedly 40%.  In the setting of ongoing cardiomyopathy, he underwent  catheterization in 2017 which reportedly showed normal coronary arteries.  In the setting of short PR and palpitations with concern for accessory pathway, he underwent EP study at [facility] in April 2019 which showed no evidence of dual AV nodal physiology, VA conduction, accessory pathway, or inducible SVT.  A Medtronic Linq was subsequently placed.  [name] established care with Dr. Cindie in June 2022.  Echo in July 2022 showed an EF of 45 to 50% with global hypokinesis.  More recent follow-up echo in May 2025 showed persistent LV dysfunction with an EF of 45 to 50%, normal RV size and function, and no significant valvular disease.   [name] was last seen in cardiology clinic in June 2025 at which time he reported fleeting episodes of chest pain.  In the setting of ongoing LV dysfunction, he was transition from ARB to Entresto  with stable lab work on June 13.  Since being placed on Entresto , he overall feels better.  He notes that his blood pressure at home has been running in the 1 teens, which is remarkable for him.  He still occasionally notes a brief palpitation when he is feeling anxious about something or if he is suddenly surprised or scared.  Other than that, his palpitations have been well-controlled.  He denies chest pain, dyspnea, PND, orthopnea, dizziness, syncope, edema, or early satiety. Objective   Home Medications    Current Outpatient Medications  Medication Sig Dispense Refill   aspirin  EC 81 MG tablet Take  1 tablet (81 mg total) by mouth daily. Swallow whole.     CARDIZEM  CD 180 MG 24 hr capsule Take 1 capsule (180 mg total) by mouth daily. 90 capsule 4   carvedilol  (COREG ) 6.25 MG tablet TAKE 1 TABLET TWICE DAILY 180 tablet 3   COSOPT 2-0.5 % ophthalmic solution 1 drop 2 (two) times daily. (Patient taking differently: 1 drop 2 (two) times daily as needed.)     fluticasone (FLONASE) 50 MCG/ACT nasal spray Place 1 spray into both nostrils daily. (Patient taking differently: Place  1 spray into both nostrils daily as needed.)     ibuprofen  (ADVIL ) 600 MG tablet Take 1 tablet (600 mg total) by mouth every 6 (six) hours as needed. 30 tablet 0   latanoprost (XALATAN) 0.005 % ophthalmic solution Place 1 drop into both eyes at bedtime. (Patient taking differently: Place 1 drop into both eyes at bedtime as needed.)     meloxicam  (MOBIC ) 15 MG tablet Take 1 tablet (15 mg total) by mouth daily. (Patient taking differently: Take 15 mg by mouth daily as needed.) 30 tablet 1   methocarbamol  (ROBAXIN ) 500 MG tablet Take 1 tablet (500 mg total) by mouth every 6 (six) hours as needed for muscle spasms. 120 tablet 4   rosuvastatin  (CRESTOR ) 10 MG tablet Take 1 tablet (10 mg total) by mouth daily. 90 tablet 4   sacubitril-valsartan (ENTRESTO ) 49-51 MG Take 1 tablet by mouth 2 (two) times daily. 60 tablet 11   sildenafil  (VIAGRA ) 100 MG tablet Take 1 tab 1 hour  prior to incourse 30 tablet 6   tamsulosin  (FLOMAX ) 0.4 MG CAPS capsule Take 2 tablets once a day (Patient taking differently: Take 1 tablets once a day) 60 capsule 11   No current facility-administered medications for this visit.     Physical Exam    VS:  BP 118/70 (BP Location: Left Arm, Patient Position: Sitting, Cuff Size: Normal)   Pulse 71   Ht 5' 7 (1.702 m)   Wt 117 lb 4 oz (53.2 kg)   SpO2 98%   BMI 18.36 kg/m  , BMI Body mass index is 18.36 kg/m.          GEN: Thin, in no acute distress. HEENT: normal. Neck: Supple, no JVD, carotid bruits, or masses. Cardiac: RRR, no murmurs, rubs, or gallops. No clubbing, cyanosis, edema.  Radials 2+/PT 2+ and equal bilaterally.  Respiratory:  Respirations regular and unlabored, clear to auscultation bilaterally. GI: Soft, nontender, nondistended, BS + x 4. MS: no deformity or atrophy. Skin: warm and dry, no rash. Neuro:  Strength and sensation are intact. Psych: Normal affect.  Accessory Clinical Findings    ECG personally reviewed by me today - EKG  Interpretation Date/Time:  Wednesday December 24 2023 09:46:12 EDT Ventricular Rate:  71 PR Interval:  162 QRS Duration:  84 QT Interval:  394 QTC Calculation: 428 R Axis:   44  Text Interpretation: Normal sinus rhythm Minimal voltage criteria for LVH, may be normal variant ( Sokolow-Lyon )   No significant change was found  Confirmed by Vivienne Bruckner [phone]) on 12/24/2023 9:53:42 AM  - no acute changes.  Lab Results  Component Value Date   WBC 4.9 10/06/2023   HGB 12.8 (L) 10/06/2023   HCT 38.3 10/06/2023   MCV 98 (H) 10/06/2023   PLT 193 10/06/2023   Lab Results  Component Value Date   CREATININE 1.02 12/12/2023   BUN 17 12/12/2023   NA 139 12/12/2023   K  4.3 12/12/2023   CL 102 12/12/2023   CO2 19 (L) 12/12/2023   Lab Results  Component Value Date   ALT 12 10/06/2023   AST 18 10/06/2023   ALKPHOS 73 10/06/2023   BILITOT 0.2 10/06/2023   Lab Results  Component Value Date   CHOL 112 10/06/2023   HDL 54 10/06/2023   LDLCALC 42 10/06/2023   TRIG 77 10/06/2023   CHOLHDL 2.9 09/14/2021    Lab Results  Component Value Date   HGBA1C 5.4 09/14/2021   Lab Results  Component Value Date   TSH 0.933 10/06/2023       Assessment & Plan    1.  Chronic heart failure midrange ejection fraction/nonischemic cardiomyopathy: Relatively long history of HRmrEF with a EF previously as low as 40% but generally trending in the 45 to 50% range for several years (45-50% by most recent echo May 2025).  Prior cardiac catheterization in 2017 reportedly showed normal coronary arteries.  He is euvolemic on examination.  He was recently switched to Entresto  which has improved his blood pressure control.  He otherwise remains on carvedilol .  I note that he has been on diltiazem  CD for many years despite reduced EF, in the setting of history of palpitations.  I discussed this with Dr. Cindie today.  As patient has derive benefit from diltiazem  related to reduced palpitations, and has been  on for a long time with stable, mild LV dysfunction, we will continue diltiazem  for now but would have a low threshold to discontinue and titrate beta-blocker in the future if EF were to drop.  SGLT2 inhibitor cost prohibitive.  As pressure has been running in the 1 teens at home, we will hold off on adding spironolactone at this time.  2.  Palpitations/PVCs: Previously evaluated with a EP study in April 2019, which was negative for inducible tachycardias.  Patient is overall stable with only rare and brief palpitations in the setting of anxiety or being suddenly scared.  As above, palpitations have been well-managed with beta-blocker and calcium  channel blocker and we will continue both.  He has implantable loop recorder and is followed closely by electrophysiology.  3.  Primary hypertension: Patient has noted significant improvement in blood pressure management following transition from losartan  to Entresto .  Pressures 118/70 today.  Continue Entresto , beta-blocker, and calcium  channel blocker.  4.  Tobacco abuse: Currently smoking 1 cigarette a day.  Complete cessation advised.  He says that he no longer buy cigarettes.  5.  Disposition: Follow-up with general cardiology in 6 months or sooner if necessary.  Lonni Meager, NP 12/24/2023, 9:53 AM

## 2023-12-24 NOTE — Patient Instructions (Signed)
 Medication Instructions:  Your physician recommends that you continue on your current medications as directed. Please refer to the Current Medication list given to you today.   *If you need a refill on your cardiac medications before your next appointment, please call your pharmacy*  Lab Work: None ordered at this time   Follow-Up: At [facility], you and your health needs are our priority.  As part of our continuing mission to provide you with exceptional heart care, our providers are all part of one team.  This team includes your primary Cardiologist (physician) and Advanced Practice Providers or APPs (Physician Assistants and Nurse Practitioners) who all work together to provide you with the care you need, when you need it.  Your next appointment:   6 month(s)  Provider:   You may see Redell Cave, MD or Lonni Meager, N

## 2023-12-25 ENCOUNTER — Ambulatory Visit: Payer: Self-pay | Admitting: Cardiology

## 2023-12-25 ENCOUNTER — Ambulatory Visit

## 2023-12-25 DIAGNOSIS — I428 Other cardiomyopathies: Secondary | ICD-10-CM

## 2023-12-25 LAB — CUP PACEART REMOTE DEVICE CHECK
Date Time Interrogation Session: 20250625231522
Implantable Pulse Generator Implant Date: 20221228

## 2023-12-31 ENCOUNTER — Telehealth: Payer: Self-pay | Admitting: Nurse Practitioner

## 2023-12-31 ENCOUNTER — Other Ambulatory Visit: Payer: Self-pay

## 2023-12-31 DIAGNOSIS — I456 Pre-excitation syndrome: Secondary | ICD-10-CM

## 2023-12-31 DIAGNOSIS — I10 Essential (primary) hypertension: Secondary | ICD-10-CM

## 2023-12-31 MED ORDER — TAMSULOSIN HCL 0.4 MG PO CAPS: ORAL_CAPSULE | ORAL | Status: DC

## 2023-12-31 MED ORDER — CARDIZEM CD 180 MG PO CP24: 180.0000 mg | ORAL_CAPSULE | Freq: Every day | ORAL | 3 refills | Status: DC

## 2023-12-31 MED ORDER — CARVEDILOL 6.25 MG PO TABS: 6.2500 mg | ORAL_TABLET | Freq: Two times a day (BID) | ORAL | 3 refills | Status: AC

## 2023-12-31 MED ORDER — ROSUVASTATIN CALCIUM 10 MG PO TABS: 10.0000 mg | ORAL_TABLET | Freq: Every day | ORAL | 3 refills | Status: AC

## 2023-12-31 MED ORDER — CARDIZEM CD 180 MG PO CP24: 180.0000 mg | ORAL_CAPSULE | Freq: Every day | ORAL | 4 refills | Status: DC

## 2023-12-31 MED ORDER — TAMSULOSIN HCL 0.4 MG PO CAPS: ORAL_CAPSULE | ORAL | 11 refills | Status: DC

## 2023-12-31 MED ORDER — SACUBITRIL-VALSARTAN 49-51 MG PO TABS: 1.0000 | ORAL_TABLET | Freq: Two times a day (BID) | ORAL | 11 refills | Status: DC

## 2023-12-31 NOTE — Telephone Encounter (Signed)
*  STAT* If patient is at the pharmacy, call can be transferred to refill team.   1. Which medications need to be refilled? (please list name of each medication and dose if known)   CARDIZEM  CD 180 MG 24 hr capsule    tamsulosin  (FLOMAX ) 0.4 MG CAPS capsule    rosuvastatin  (CRESTOR ) 10 MG tablet    carvedilol  (COREG ) 6.25 MG tablet    2. Which pharmacy/location (including street and city if local pharmacy) is medication to be sent to?  [facility] [place]  - [place], [place] - [id number] [facility] [address]      3. Do they need a 30 day or 90 day supply? 90 day

## 2023-12-31 NOTE — Telephone Encounter (Signed)
 Called patient after requesting via mychart. Patient states he switched insurance companies and they are requesting medications be sent to another pharmacy, advised they would send a request for all medications be sent to them, however he needed his Entresto  sent as he was due for it. [facility], other medications he requested be [facility] and they would send a request.   Patient thankful for call back.

## 2023-12-31 NOTE — Addendum Note (Signed)
 Addended by: HEZEKIAH BOBETTA PARAS on: 12/31/2023 01:34 PM   Modules accepted: Orders

## 2024-01-05 ENCOUNTER — Other Ambulatory Visit (HOSPITAL_COMMUNITY): Payer: Self-pay

## 2024-01-05 ENCOUNTER — Telehealth: Payer: Self-pay | Admitting: Pharmacy Technician

## 2024-01-05 ENCOUNTER — Telehealth: Payer: Self-pay | Admitting: Nurse Practitioner

## 2024-01-05 NOTE — Telephone Encounter (Signed)
 Pharmacy Patient Advocate Encounter   Received notification from Pt Calls Messages that prior authorization for Cardizem  CD 180MG  is required/requested.   Insurance verification completed.   The patient is insured through [facility] ADVANTAGE/RX ADVANCE .   Per test claim: The current 01/05/24 day co-pay is, $100.00- one month.  No PA needed at this time. This test claim was processed through [facility]- copay amounts may vary at other pharmacies due to pharmacy/plan contracts, or as the patient moves through the different stages of their insurance plan.

## 2024-01-05 NOTE — Telephone Encounter (Signed)
 Called patient, he just had questions about continuing all of his medications.   Per last visit with [place], NP he recommended to continue medications, but advised him to monitor his blood pressure and heart rate and call us  with any symptoms. Patient verbalized understanding.

## 2024-01-05 NOTE — Telephone Encounter (Signed)
*  STAT* If patient is at the pharmacy, call can be transferred to refill team.   1. Which medications need to be refilled? (please list name of each medication and dose if known) need prior authorization for Cardizem  CD 180 mg or can do it  generic Cardizem  2. Would you like to learn more about the convenience, safety, & potential cost savings by using the [facility]?     3. Are you open to using the [facility] (Type [facility]. .   4. Which pharmacy/location (including street and city if local pharmacy) is medication to be sent to? [facility]    5. Do they need a 30 day or 90 day supply? 90 days and refills

## 2024-01-07 ENCOUNTER — Telehealth: Payer: Self-pay | Admitting: Cardiology

## 2024-01-07 ENCOUNTER — Telehealth: Payer: Self-pay

## 2024-01-07 ENCOUNTER — Other Ambulatory Visit (HOSPITAL_COMMUNITY): Payer: Self-pay

## 2024-01-07 ENCOUNTER — Telehealth: Payer: Self-pay | Admitting: Emergency Medicine

## 2024-01-07 DIAGNOSIS — I10 Essential (primary) hypertension: Secondary | ICD-10-CM

## 2024-01-07 NOTE — Telephone Encounter (Signed)
 Pt c/o medication issue:  1. Name of Medication: CARDIZEM  CD 180 MG 24 hr capsule   2. How are you currently taking this medication (dosage and times per day)?    3. Are you having a reaction (difficulty breathing--STAT)? no  4. What is your medication issue? Called to see if the patient can take Diltiazem  instead. States the insurance is not covering the medication. Please advise

## 2024-01-07 NOTE — Telephone Encounter (Signed)
 Pharmacy Patient Advocate Encounter   Received notification from Physician's Office that prior authorization for CARDIZEM  is required/requested.   Insurance verification completed.   The patient is insured through [facility] ADVANTAGE/RX ADVANCE .   Per test claim: Refill too soon. PA is not needed at this time. Medication was filled 01/07/24 FOR 90 DAYS. Next eligible fill date is 03/15/24.   PER [facility] CARDIZEM  SHIPPED TODAY AND SHOULD ARRIVE WITHIN 2 DAYS

## 2024-01-07 NOTE — Telephone Encounter (Signed)
 Pharmacy Patient Advocate Encounter   Received notification from Physician's Office that prior authorization for CARVEDILOL  is required/requested.   Insurance verification completed.   The patient is insured through [facility] ADVANTAGE/RX ADVANCE .   Per test claim: Refill too soon. PA is not needed at this time. Medication was filled 12/31/23 FOR A 90 DAY SUPPLY. Next eligible fill date is 03/08/24.      FILL SHOWS ON DISPENSE HISTORY AS WELL ON 12/31/23 WITH [facility].   CALLED PHARMACY FOR DETAILS ON DRUG SHIPMENT. PER [facility] SHIPMENT WENT OUT 12/31/23 AND WAS DELIVERED 01/05/24 (CARVEDILOL  WAS INCLUDED IN SHIPMENT, LEFT IN MAILBOX PER [facility])   PATIENT MAY BE CONFUSING CARVEDILOL  WITH CARDIZEM . CARDIZEM  SHIPPED OUT TODAY 01/07/24. THE EMAIL FROM MAIL PHARMACY LIKELY CUTS OFF DRUG NAME AFTER A FEW LETTERS SO HE JUST GOT A NOTICE THAT CARD WAS SHIPPING SO THAT MAY HAVE CAUSED CONFUSION. THE CARDIZEM  WILL ARRIVE MAX 2 DAYS SO PT SHOULD HAVE THIS BY END OF WEEK.   IF PT IS TRULY MISSING CARVEDILOL  SHIPMENT, THEY CAN REACH OUT TO [facility] DIRECT[facility] (NUMBER ON RX BOTTLE) TO REQUEST A SHIPMENT REPLACEMENT.

## 2024-01-07 NOTE — Telephone Encounter (Signed)
 Called and spoke with the patient.  Patient states that all of his medications have been filled except for the Carvedilol .  He states that the pharmacy [facility]) wanted to know if this needed to be brand name or could it be generic.  Patient says they need to speak to the doctor to verify.  Advised the patient that we will forward the message to our pharmacy dept.

## 2024-01-07 NOTE — Telephone Encounter (Signed)
 Pharmacy called back stating pt is upset that she has not got a response. She states they have been calling since last week. I told her I think there may have been a miscommunication on what information was needed because I saw a note for PA. I apologized on our behalf and told her I would send message again.

## 2024-01-07 NOTE — Telephone Encounter (Addendum)
 Pharmacy Patient Advocate Encounter   Received notification from Physician's Office that prior authorization for CARVEDILOL  is required/requested.   Insurance verification completed.   The patient is insured through [facility] ADVANTAGE/RX ADVANCE .   Per test claim: Refill too soon. PA is not needed at this time. Medication was filled 12/31/23 FOR A 90 DAY SUPPLY. Next eligible fill date is 03/08/24.      FILL SHOWS ON DISPENSE HISTORY AS WELL ON 12/31/23 WITH [facility].   CALLED PHARMACY FOR DETAILS ON DRUG SHIPMENT. PER [facility] SHIPMENT WENT OUT 12/31/23 AND WAS DELIVERED 01/05/24 (CARVEDILOL  WAS INCLUDED IN SHIPMENT, LEFT IN MAILBOX PER [facility])   PATIENT MAY BE CONFUSING CARVEDILOL  WITH CARDIZEM . CARDIZEM  SHIPPED OUT TODAY 01/07/24. THE EMAIL FROM MAIL PHARMACY LIKELY CUTS OFF DRUG NAME AFTER A FEW LETTERS SO HE JUST GOT A NOTICE THAT CARD WAS SHIPPING SO THAT MAY HAVE CAUSED CONFUSION. THE CARDIZEM  WILL ARRIVE MAX 2 DAYS SO PT SHOULD HAVE THIS BY END OF WEEK.   IF PT IS TRULY MISSING CARVEDILOL  SHIPMENT, THEY CAN REACH OUT TO [facility] DIRECT[facility] (NUMBER ON RX BOTTLE) TO REQUEST A SHIPMENT REPLACEMENT.

## 2024-01-07 NOTE — Telephone Encounter (Signed)
 See previous message.  Triage nurse spoke with patient today- Cardizem  advised to arrive within 2 days.

## 2024-01-19 NOTE — Progress Notes (Signed)
 Carelink Summary Report / Loop Recorder

## 2024-01-26 ENCOUNTER — Ambulatory Visit (INDEPENDENT_AMBULATORY_CARE_PROVIDER_SITE_OTHER)

## 2024-01-26 DIAGNOSIS — I428 Other cardiomyopathies: Secondary | ICD-10-CM

## 2024-01-26 LAB — CUP PACEART REMOTE DEVICE CHECK
Date Time Interrogation Session: 20250727231834
Implantable Pulse Generator Implant Date: 20221228

## 2024-01-29 ENCOUNTER — Ambulatory Visit: Payer: Self-pay | Admitting: Cardiology

## 2024-02-02 ENCOUNTER — Ambulatory Visit: Payer: Self-pay | Attending: Physical Therapy | Admitting: Physical Therapy

## 2024-02-09 ENCOUNTER — Ambulatory Visit: Payer: Self-pay | Admitting: Physical Therapy

## 2024-02-16 ENCOUNTER — Ambulatory Visit: Payer: Self-pay | Admitting: Physical Therapy

## 2024-02-23 ENCOUNTER — Ambulatory Visit: Payer: Self-pay | Admitting: Physical Therapy

## 2024-02-26 ENCOUNTER — Ambulatory Visit (INDEPENDENT_AMBULATORY_CARE_PROVIDER_SITE_OTHER)

## 2024-02-26 DIAGNOSIS — R002 Palpitations: Secondary | ICD-10-CM

## 2024-02-26 LAB — CUP PACEART REMOTE DEVICE CHECK
Date Time Interrogation Session: 20250827231657
Implantable Pulse Generator Implant Date: 20221228

## 2024-02-27 ENCOUNTER — Ambulatory Visit: Payer: Self-pay | Admitting: Cardiology

## 2024-02-27 ENCOUNTER — Telehealth: Payer: Self-pay | Admitting: Nurse Practitioner

## 2024-02-27 DIAGNOSIS — F419 Anxiety disorder, unspecified: Secondary | ICD-10-CM

## 2024-02-27 NOTE — Telephone Encounter (Signed)
 Copied from CRM [id number]. Topic: Referral - Request for Referral >> Feb 27, 2024  3:34 PM Zebedee SAUNDERS wrote: Did the patient discuss referral with their provider in the last year? Yes (If No - schedule appointment) (If Yes - send message)  Appointment offered? Yes  Type of order/referral and detailed reason for visit: Dispression  Preference of office, provider, location: [place], [place] area  If referral order, have you been seen by this specialty before? Yes (If Yes, this issue or another issue? [facility] 09/2023  Can we respond through MyChart? Yes

## 2024-03-02 NOTE — Telephone Encounter (Signed)
 Noted and [facility] referral in

## 2024-03-03 ENCOUNTER — Telehealth: Payer: Self-pay

## 2024-03-03 NOTE — Progress Notes (Signed)
 Complex Care Management Note  Care Guide Note 03/03/2024 Name: Jesus Price MRN: [id number] DOB: 03-19-1961  [name] is a 63 y.o. year old male who sees Valerio Moris T, NP for primary care. I reached out to Lamar Morton Dollar by phone today to offer complex care management services.  Mr. Luckenbach was given information about Complex Care Management services today including:   The Complex Care Management services include support from the care team which includes your Nurse Care Manager, Clinical Social Worker, or Pharmacist.  The Complex Care Management team is here to help remove barriers to the health concerns and goals most important to you. Complex Care Management services are voluntary, and the patient may decline or stop services at any time by request to their care team member.   Complex Care Management Consent Status: Patient agreed to services and verbal consent obtained.   Follow up plan:  Telephone appointment with complex care management team member scheduled for:  03/12/2024  Encounter Outcome:  Patient Scheduled  Jeoffrey Buffalo , RMA     [place]  [facility], [facility] Guide  Direct Dial: [phone]  Website: [email or web address]

## 2024-03-08 ENCOUNTER — Ambulatory Visit: Payer: Self-pay | Admitting: Physical Therapy

## 2024-03-11 NOTE — Progress Notes (Signed)
 Remote Loop Recorder Transmission

## 2024-03-12 ENCOUNTER — Telehealth: Payer: Self-pay | Admitting: Licensed Clinical Social Worker

## 2024-03-13 NOTE — Patient Instructions (Signed)
 Be Involved in Caring For Your Health:  Taking Medications When medications are taken as directed, they can greatly improve your health. But if they are not taken as prescribed, they may not work. In some cases, not taking them correctly can be harmful. To help ensure your treatment remains effective and safe, understand your medications and how to take them. Bring your medications to each visit for review by your provider.  Your lab results, notes, and after visit summary will be available on My Chart. We strongly encourage you to use this feature. If lab results are abnormal the clinic will contact you with the appropriate steps. If the clinic does not contact you assume the results are satisfactory. You can always view your results on My Chart. If you have questions regarding your health or results, please contact the clinic during office hours. You can also ask questions on My Chart.  We at [facility] are grateful that you chose [place] to provide your care. We strive to provide evidence-based and compassionate care and are always looking for feedback. If you get a survey from the clinic please complete this so we can hear your opinions.  Heart-Healthy Eating Plan Many factors influence your heart health, including eating and exercise habits. Heart health is also called coronary health. Coronary risk increases with abnormal blood fat (lipid) levels. A heart-healthy eating plan includes limiting unhealthy fats, increasing healthy fats, limiting salt (sodium) intake, and making other diet and lifestyle changes. What is my plan? Your health care provider may recommend that: You limit your fat intake to _________% or less of your total calories each day. You limit your saturated fat intake to _________% or less of your total calories each day. You limit the amount of cholesterol in your diet to less than _________ mg per day. You limit the amount of sodium in your diet to less than _________  mg per day. What are tips for following this plan? Cooking Cook foods using methods other than frying. Baking, boiling, grilling, and broiling are all good options. Other ways to reduce fat include: Removing the skin from poultry. Removing all visible fats from meats. Steaming vegetables in water or broth. Meal planning  At meals, imagine dividing your plate into fourths: Fill one-half of your plate with vegetables and green salads. Fill one-fourth of your plate with whole grains. Fill one-fourth of your plate with lean protein foods. Eat 2-4 cups of vegetables per day. One cup of vegetables equals 1 cup (91 g) broccoli or cauliflower florets, 2 medium carrots, 1 large bell pepper, 1 large sweet potato, 1 large tomato, 1 medium white potato, 2 cups (150 g) raw leafy greens. Eat 1-2 cups of fruit per day. One cup of fruit equals 1 small apple, 1 large banana, 1 cup (237 g) mixed fruit, 1 large orange,  cup (82 g) dried fruit, 1 cup (240 mL) 100% fruit juice. Eat more foods that contain soluble fiber. Examples include apples, broccoli, carrots, beans, peas, and barley. Aim to get 25-30 g of fiber per day. Increase your consumption of legumes, nuts, and seeds to 4-5 servings per week. One serving of dried beans or legumes equals  cup (90 g) cooked, 1 serving of nuts is  oz (12 almonds, 24 pistachios, or 7 walnut halves), and 1 serving of seeds equals  oz (8 g). Fats Choose healthy fats more often. Choose monounsaturated and polyunsaturated fats, such as olive and canola oils, avocado oil, flaxseeds, walnuts, almonds, and seeds. Eat  more omega-3 fats. Choose salmon, mackerel, sardines, tuna, flaxseed oil, and ground flaxseeds. Aim to eat fish at least 2 times each week. Check food labels carefully to identify foods with trans fats or high amounts of saturated fat. Limit saturated fats. These are found in animal products, such as meats, butter, and cream. Plant sources of saturated fats  include palm oil, palm kernel oil, and coconut oil. Avoid foods with partially hydrogenated oils in them. These contain trans fats. Examples are stick margarine, some tub margarines, cookies, crackers, and other baked goods. Avoid fried foods. General information Eat more home-cooked food and less restaurant, buffet, and fast food. Limit or avoid alcohol. Limit foods that are high in added sugar and simple starches such as foods made using white refined flour (white breads, pastries, sweets). Lose weight if you are overweight. Losing just 5-10% of your body weight can help your overall health and prevent diseases such as diabetes and heart disease. Monitor your sodium intake, especially if you have high blood pressure. Talk with your health care provider about your sodium intake. Try to incorporate more vegetarian meals weekly. What foods should I eat? Fruits All fresh, canned (in natural juice), or frozen fruits. Vegetables Fresh or frozen vegetables (raw, steamed, roasted, or grilled). Green salads. Grains Most grains. Choose whole wheat and whole grains most of the time. Rice and pasta, including brown rice and pastas made with whole wheat. Meats and other proteins Lean, well-trimmed beef, veal, pork, and lamb. Chicken and [place] without skin. All fish and shellfish. Wild duck, rabbit, pheasant, and venison. Egg whites or low-cholesterol egg substitutes. Dried beans, peas, lentils, and tofu. Seeds and most nuts. Dairy Low-fat or nonfat cheeses, including ricotta and mozzarella. Skim or 1% milk (liquid, powdered, or evaporated). Buttermilk made with low-fat milk. Nonfat or low-fat yogurt. Fats and oils Non-hydrogenated (trans-free) margarines. Vegetable oils, including soybean, sesame, sunflower, olive, avocado, peanut, safflower, corn, canola, and cottonseed. Salad dressings or mayonnaise made with a vegetable oil. Beverages Water (mineral or sparkling). Coffee and tea. Unsweetened ice  tea. Diet beverages. Sweets and desserts Sherbet, gelatin, and fruit ice. Small amounts of dark chocolate. Limit all sweets and desserts. Seasonings and condiments All seasonings and condiments. The items listed above may not be a complete list of foods and beverages you can eat. Contact a dietitian for more options. What foods should I avoid? Fruits Canned fruit in heavy syrup. Fruit in cream or butter sauce. Fried fruit. Limit coconut. Vegetables Vegetables cooked in cheese, cream, or butter sauce. Fried vegetables. Grains Breads made with saturated or trans fats, oils, or whole milk. Croissants. Sweet rolls. Donuts. High-fat crackers, such as cheese crackers and chips. Meats and other proteins Fatty meats, such as hot dogs, ribs, sausage, bacon, rib-eye roast or steak. High-fat deli meats, such as salami and bologna. Caviar. Domestic duck and goose. Organ meats, such as liver. Dairy Cream, sour cream, cream cheese, and creamed cottage cheese. Whole-milk cheeses. Whole or 2% milk (liquid, evaporated, or condensed). Whole buttermilk. Cream sauce or high-fat cheese sauce. Whole-milk yogurt. Fats and oils Meat fat, or shortening. Cocoa butter, hydrogenated oils, palm oil, coconut oil, palm kernel oil. Solid fats and shortenings, including bacon fat, salt pork, lard, and butter. Nondairy cream substitutes. Salad dressings with cheese or sour cream. Beverages Regular sodas and any drinks with added sugar. Sweets and desserts Frosting. Pudding. Cookies. Cakes. Pies. Milk chocolate or white chocolate. Buttered syrups. Full-fat ice cream or ice cream drinks. The items listed above may  not be a complete list of foods and beverages to avoid. Contact a dietitian for more information. Summary Heart-healthy meal planning includes limiting unhealthy fats, increasing healthy fats, limiting salt (sodium) intake and making other diet and lifestyle changes. Lose weight if you are overweight. Losing just  5-10% of your body weight can help your overall health and prevent diseases such as diabetes and heart disease. Focus on eating a balance of foods, including fruits and vegetables, low-fat or nonfat dairy, lean protein, nuts and legumes, whole grains, and heart-healthy oils and fats. This information is not intended to replace advice given to you by your health care provider. Make sure you discuss any questions you have with your health care provider. Document Revised: 07/23/2021 Document Reviewed: 07/23/2021 Elsevier Patient Education  2024 [employer].

## 2024-03-15 ENCOUNTER — Ambulatory Visit: Payer: Self-pay | Admitting: Physical Therapy

## 2024-03-15 ENCOUNTER — Ambulatory Visit (INDEPENDENT_AMBULATORY_CARE_PROVIDER_SITE_OTHER): Payer: Self-pay | Admitting: Nurse Practitioner

## 2024-03-15 ENCOUNTER — Telehealth: Payer: Self-pay

## 2024-03-15 ENCOUNTER — Encounter: Payer: Self-pay | Admitting: Nurse Practitioner

## 2024-03-15 ENCOUNTER — Other Ambulatory Visit: Payer: Self-pay | Admitting: Licensed Clinical Social Worker

## 2024-03-15 VITALS — BP 125/78 | HR 69 | Temp 98.1°F | Resp 15 | Ht 67.01 in | Wt 115.4 lb

## 2024-03-15 DIAGNOSIS — F419 Anxiety disorder, unspecified: Secondary | ICD-10-CM | POA: Diagnosis not present

## 2024-03-15 DIAGNOSIS — N401 Enlarged prostate with lower urinary tract symptoms: Secondary | ICD-10-CM

## 2024-03-15 DIAGNOSIS — R3914 Feeling of incomplete bladder emptying: Secondary | ICD-10-CM

## 2024-03-15 DIAGNOSIS — I5022 Chronic systolic (congestive) heart failure: Secondary | ICD-10-CM | POA: Diagnosis not present

## 2024-03-15 DIAGNOSIS — E782 Mixed hyperlipidemia: Secondary | ICD-10-CM

## 2024-03-15 DIAGNOSIS — F331 Major depressive disorder, recurrent, moderate: Secondary | ICD-10-CM | POA: Diagnosis not present

## 2024-03-15 DIAGNOSIS — B182 Chronic viral hepatitis C: Secondary | ICD-10-CM

## 2024-03-15 DIAGNOSIS — I456 Pre-excitation syndrome: Secondary | ICD-10-CM

## 2024-03-15 DIAGNOSIS — F17200 Nicotine dependence, unspecified, uncomplicated: Secondary | ICD-10-CM | POA: Diagnosis not present

## 2024-03-15 DIAGNOSIS — I428 Other cardiomyopathies: Secondary | ICD-10-CM | POA: Diagnosis not present

## 2024-03-15 DIAGNOSIS — I10 Essential (primary) hypertension: Secondary | ICD-10-CM

## 2024-03-15 DIAGNOSIS — G459 Transient cerebral ischemic attack, unspecified: Secondary | ICD-10-CM

## 2024-03-15 MED ORDER — HYDROXYZINE PAMOATE 25 MG PO CAPS: 25.0000 mg | ORAL_CAPSULE | Freq: Three times a day (TID) | ORAL | 0 refills | Status: DC | PRN

## 2024-03-15 MED ORDER — MIRTAZAPINE 7.5 MG PO TABS
7.5000 mg | ORAL_TABLET | Freq: Every day | ORAL | 1 refills | Status: DC
Start: 2024-03-15 — End: 2024-03-18

## 2024-03-15 MED ORDER — TAMSULOSIN HCL 0.4 MG PO CAPS: 0.8000 mg | ORAL_CAPSULE | Freq: Every day | ORAL | 3 refills | Status: DC

## 2024-03-15 NOTE — Assessment & Plan Note (Signed)
 Chronic, exacerbated by life.  Denies SI/HI.  Start Mirtazapine  7.5 MG at night, has taken in past with benefit.  May also help his appetite and weight.  Vistaril  ordered to take only as needed for increased anxiety or panic.  Educated him on these at length and BLACK BOX warning.  Referral to therapy placed.

## 2024-03-15 NOTE — Assessment & Plan Note (Signed)
 Chronic, stable. Continue to follow with cardiology and medication regimen as prescribed by them. Recent notes reviewed.

## 2024-03-15 NOTE — Assessment & Plan Note (Signed)
 I have recommended complete cessation of tobacco use. I have discussed various options available for assistance with tobacco cessation including over the counter methods (Nicotine gum, patch and lozenges). We also discussed prescription options (Chantix, Nicotine Inhaler / Nasal Spray). The patient is not interested in pursuing any prescription tobacco cessation options at this time.

## 2024-03-15 NOTE — Assessment & Plan Note (Signed)
Chronic, ongoing.  Continue collaboration with cardiology.  Recent notes reviewed.

## 2024-03-15 NOTE — Assessment & Plan Note (Signed)
 Chronic, stable.  BP at goal today.  Attempted reduction of Losartan  to 50 MG, but he did not tolerate this.  Recommend he monitor BP at least a few mornings a week at home and document.  DASH diet at home.  Continue current medication regimen and adjust as needed. Labs today: CBC, CMP, TSH.  Urine ALB 80 March 2025, continue Losartan  for kidney protection.

## 2024-03-15 NOTE — Patient Outreach (Signed)
 Complex Care Management   Visit Note  03/16/2024  Name:  Jesus Price MRN: [id number] DOB: 12/29/1960  Situation: Referral received for Complex Care Management related to Mental/Behavioral Health diagnosis GAD I obtained verbal consent from Patient.  Visit completed with Patient  on the phone  Background:   Past Medical History:  Diagnosis Date   Arthritis    Family history of sudden cardiac death    Glaucoma    Heart failure with mid-range ejection fraction (HCC)    a. 10/2023 Echo: EF 45-50%, no rwma, nl RV size/fxn.   Hypertension    Implantable loop recorder present    a. MDT Linq - placed 09/2017.   NICM (nonischemic cardiomyopathy) (HCC)    a. 2012 Echo: EF 40%; b. 05/2016 Cath: nl cors; c. 05/2016 Echo: EF 50%; d. 09/2017 [facility]): EF 39%, nl RV size/fxn, no signif valvular dzs. No scar/infiltrative process; e. 12/2020 Echo:  EF 45-50%; f. 10/2023 Echo: EF 45-50%, no rwma, nl RV size/fxn.   Shortened PR interval w/ palpitations    a. 09/2017 EP Study [facility]): no evidence of dual AV nodal physiology/VA conduction/accessory pathway/inducible SVT.  MDT Linq placed.   Situational depression 06/07/2016    Assessment: Patient Reported Symptoms:  Cognitive Cognitive Status: Alert and oriented to person, place, and time, Normal speech and language skills, Insightful and able to interpret abstract concepts Cognitive/Intellectual Conditions Management [RPT]: None reported or documented in medical history or problem list   Health Maintenance Behaviors: None Healing Pattern: Unsure Health Facilitated by: Stress management  Neurological Neurological Review of Symptoms: No symptoms reported    HEENT HEENT Symptoms Reported: No symptoms reported      Cardiovascular Cardiovascular Symptoms Reported: Dizziness Does patient have uncontrolled Hypertension?: No Cardiovascular Management Strategies: Activity, Medication therapy, Routine screening Cardiovascular Self-Management Outcome:  3 (uncertain) Cardiovascular Comment: Folloed by Dr. Cindie Cardiology  Respiratory Respiratory Symptoms Reported: No symptoms reported    Endocrine Endocrine Symptoms Reported: No symptoms reported    Gastrointestinal Gastrointestinal Symptoms Reported: No symptoms reported      Genitourinary Genitourinary Symptoms Reported: No symptoms reported    Integumentary Integumentary Symptoms Reported: No symptoms reported    Musculoskeletal Musculoskelatal Symptoms Reviewed: No symptoms reported        Psychosocial Psychosocial Symptoms Reported: Anxiety - if selected complete GAD, Depression - if selected complete PHQ 2-9 Behavioral Management Strategies: Coping strategies, Counseling Behavioral Health Self-Management Outcome: 4 (good) Major Change/Loss/Stressor/Fears (CP): Resources Techniques to [employer] with Loss/Stress/Change: Counseling Quality of Family Relationships: helpful, involved Do you feel physically threatened by others?: No    There were no vitals filed for this visit.  Medications Reviewed Today     Reviewed by Kit Alm LABOR, LCSW (Social Worker) on 03/16/24 at 0840  Med List Status: <None>   Medication Order Taking? Sig Documenting Provider Last Dose Status Informant  aspirin  EC 81 MG tablet [id number] Yes Take 1 tablet (81 mg total) by mouth daily. Swallow whole. Cannady, Jolene T, NP  Active   CARDIZEM  CD 180 MG 24 hr capsule [id number] Yes Take 1 capsule (180 mg total) by mouth daily. Vivienne Lonni Ingle, NP  Active   carvedilol  (COREG ) 6.25 MG tablet [id number] Yes Take 1 tablet (6.25 mg total) by mouth 2 (two) times daily. Darliss Rogue, MD  Active   COSOPT 2-0.5 % ophthalmic solution [id number] Yes 1 drop 2 (two) times daily. [provider]  Active   fluticasone (FLONASE) 50 MCG/ACT nasal spray [id number] Yes Place 1 spray into  both nostrils daily. [provider]  Active   hydrOXYzine  (VISTARIL ) 25 MG capsule [id number] Yes Take 1  capsule (25 mg total) by mouth every 8 (eight) hours as needed for anxiety. Cannady, Jolene T, NP  Active   ibuprofen  (ADVIL ) 600 MG tablet [id number] Yes Take 1 tablet (600 mg total) by mouth every 6 (six) hours as needed. Corlis Burnard DEL, NP  Active   latanoprost (XALATAN) 0.005 % ophthalmic solution [id number] Yes Place 1 drop into both eyes at bedtime. [provider]  Active   meloxicam  (MOBIC ) 15 MG tablet [id number] Yes Take 1 tablet (15 mg total) by mouth daily. Cannady, Jolene T, NP  Active   methocarbamol  (ROBAXIN ) 500 MG tablet [id number] Yes Take 1 tablet (500 mg total) by mouth every 6 (six) hours as needed for muscle spasms. Cannady, Jolene T, NP  Active   mirtazapine  (REMERON ) 7.5 MG tablet [id number] Yes Take 1 tablet (7.5 mg total) by mouth at bedtime. Cannady, Jolene T, NP  Active   rosuvastatin  (CRESTOR ) 10 MG tablet [id number] Yes Take 1 tablet (10 mg total) by mouth daily. Darliss Rogue, MD  Active   sacubitril -valsartan  (ENTRESTO ) 49-51 MG [id number] Yes Take 1 tablet by mouth 2 (two) times daily. Riddle, Suzann, NP  Active   sildenafil  (VIAGRA ) 100 MG tablet [id number] Yes Take 1 tab 1 hour  prior to incourse Stoioff, Scott C, MD  Active   tamsulosin  (FLOMAX ) 0.4 MG CAPS capsule [id number] Yes Take 2 capsules (0.8 mg total) by mouth daily after supper. Cannady, Jolene T, NP  Active             Recommendation:   Referral to: Care Guide SDOH Resources  Follow Up Plan:   Telephone follow up appointment date/time:  04/06/2024  Alm Armor, LCSW [facility], [facility] Licensed Clinical Social Worker Care Coordinator [phone]

## 2024-03-15 NOTE — Telephone Encounter (Signed)
 Copied from CRM #[id number]. Topic: Referral - Question >> Mar 15, 2024  9:52 AM Emylou G wrote: Reason for CRM: Alm Armor w/[facility] called.. wanting to do referral for counseling.SABRA Pls call him back at [phone]

## 2024-03-15 NOTE — Assessment & Plan Note (Signed)
 Chronic, ongoing.  Continue collaboration with cardiology, recent notes reviewed.  Labs today.

## 2024-03-15 NOTE — Assessment & Plan Note (Signed)
 Chronic, ongoing.  Continue Rosuvastatin daily and adjust as needed.  Lipid panel today.

## 2024-03-15 NOTE — Progress Notes (Signed)
 BP 125/78 (BP Location: Left Arm, Patient Position: Sitting, Cuff Size: Normal)   Pulse 69   Temp 98.1 F (36.7 C) (Oral)   Resp 15   Ht 5' 7.01 (1.702 m)   Wt 115 lb 6.4 oz (52.3 kg)   SpO2 98%   BMI 18.07 kg/m    Subjective:    Patient ID: Jesus Price, male    DOB: 21-Jan-1961, 63 y.o.   MRN: [id number]  HPI: Jesus Price is a 63 y.o. male  Chief Complaint  Patient presents with   Urine retention    Says that he didn't take the tamsulosin  and had to force himself to urinate. It can be painful at time.    Mental Health Problem    Would like someone to talk to. Feels very overwhelmed by life, family troubles and its really bothering him.    HYPERTENSION & HYPERLIPIDEMIA Saw cardiology last on 12/24/23. Taking Carvedilol , Cardizem , Rosuvastatin , and Losartan . Has WPW. Current every day smoker, 2 cigarettes a day.  Has smoked since he was 63 years old.  No alcohol use at home.     History of Hep C was treated in 2015 and overall has been stable since. Hypertension status: controlled  Satisfied with current treatment? yes Duration of hypertension: chronic BP monitoring frequency:  weekly BP range: <100/60 on average, has been working on stress and improving this BP medication side effects:  no Medication compliance: good compliance Aspirin : yes Recurrent headaches: no Visual changes: no Palpitations: no Dyspnea: occasional with panic Chest pain: with panic recently has had episodes Lower extremity edema: no Dizzy/lightheaded: no   BPH Saw urology last on 08/22/23. If does not take Flomax  gets in trouble. BPH status: stable Satisfied with current treatment?: yes Medication side effects: no Medication compliance: good compliance Duration: chronic Nocturia: 1/night Urinary frequency:no Incomplete voiding: no Urgency: no Weak urinary stream: if does not take medication Straining to start stream: if does not take medication Dysuria: no Onset:  gradual Severity: mild Alleviating factors: Flomax  Aggravating factors: if does not take medication Treatments attempted: Flomax  IPSS Questionnaire (AUA-7): 5 AUA Over the past month.   1)  How often have you had a sensation of not emptying your bladder completely after you finish urinating?  0 - Not at all  2)  How often have you had to urinate again less than two hours after you finished urinating? 0 - Not at all  3)  How often have you found you stopped and started again several times when you urinated?  2 - Less than half the time  4) How difficult have you found it to postpone urination?  0 - Not at all  5) How often have you had a weak urinary stream?  1 - Less than 1 time in 5  6) How often have you had to push or strain to begin urination?  1 - Less than 1 time in 5  7) How many times did you most typically get up to urinate from the time you went to bed until the time you got up in the morning?  1 - 1 time  Total score:  0-7 mildly symptomatic   8-19 moderately symptomatic   20-35 severely symptomatic    DEPRESSION/ANXIETY Currently no medications, does not wish to take anything.  Has been on self help techniques.  Cannot speak his mind at home or significant other gets upset.  They have been together 13 years. Has a lot going on with  family and life. Took Mirtazapine  in the past which slowed him down. Went to therapy in the past which he enjoyed.  Reports he is by himself a lot as his other half works.  Has a dog who keeps him company and helps his mood. Mood status: exacerbated Satisfied with current treatment?: no Symptom severity: moderate  Psychotherapy/counseling: none Previous psychiatric medications:  Depressed mood: yes Anxious mood: yes Anhedonia: yes Significant weight loss or gain: no Insomnia: none, takes awhile to get there but he sleeps Fatigue: yes Feelings of worthlessness or guilt: no Impaired concentration/indecisiveness: no Suicidal ideations:  no Hopelessness: no Crying spells: yes    03/15/2024    3:10 PM 10/23/2023    9:51 AM 10/06/2023    3:06 PM 09/30/2023    3:25 PM 08/13/2023    3:13 PM  Depression screen PHQ 2/9  Decreased Interest 2 1 3 3 2   Down, Depressed, Hopeless 2 1 0 3 0  PHQ - 2 Score 4 2 3 6 2   Altered sleeping 1 1 1  0 1  Tired, decreased energy 3 1 1 3 3   Change in appetite 0 0 1 2 3   Feeling bad or failure about yourself  0 0 0 0 0  Trouble concentrating 0 0 0 0 0  Moving slowly or fidgety/restless 0 0 0 0 0  Suicidal thoughts 0 0 0 0 0  PHQ-9 Score 8 4 6 11 9   Difficult doing work/chores Not difficult at all  Not difficult at all Somewhat difficult Not difficult at all       03/15/2024    3:10 PM 10/06/2023    3:06 PM 08/13/2023    3:13 PM 03/28/2023   10:47 AM  GAD 7 : Generalized Anxiety Score  Nervous, Anxious, on Edge 1 0 1 1  Control/stop worrying 3 0 1 2  Worry too much - different things 2 0 1 1  Trouble relaxing 1 1 0 0  Restless 0 0 0 0  Easily annoyed or irritable 1 1 2 2   Afraid - awful might happen 0 0 1 0  Total GAD 7 Score 8 2 6 6   Anxiety Difficulty  Somewhat difficult Not difficult at all Somewhat difficult   Relevant past medical, surgical, family and social history reviewed and updated as indicated. Interim medical history since our last visit reviewed. Allergies and medications reviewed and updated.  Review of Systems  Constitutional:  Negative for activity change, diaphoresis, fatigue and fever.  Respiratory:  Positive for shortness of breath (rare moments). Negative for cough, chest tightness and wheezing.   Cardiovascular:  Positive for chest pain (with panic recently on occasion). Negative for palpitations and leg swelling.  Gastrointestinal: Negative.   Neurological: Negative.   Psychiatric/Behavioral:  Negative for decreased concentration, self-injury, sleep disturbance and suicidal ideas. The patient is nervous/anxious.     Per HPI unless specifically indicated above      Objective:    BP 125/78 (BP Location: Left Arm, Patient Position: Sitting, Cuff Size: Normal)   Pulse 69   Temp 98.1 F (36.7 C) (Oral)   Resp 15   Ht 5' 7.01 (1.702 m)   Wt 115 lb 6.4 oz (52.3 kg)   SpO2 98%   BMI 18.07 kg/m   Wt Readings from Last 3 Encounters:  03/15/24 115 lb 6.4 oz (52.3 kg)  12/24/23 117 lb 4 oz (53.2 kg)  12/02/23 118 lb 3.2 oz (53.6 kg)    Physical Exam Vitals and nursing note reviewed.  Constitutional:      General: He is awake. He is not in acute distress.    Appearance: He is well-developed, well-groomed and underweight. He is not ill-appearing or toxic-appearing.  HENT:     Head: Normocephalic.     Right Ear: Hearing and external ear normal.     Left Ear: Hearing and external ear normal.  Eyes:     General: Lids are normal.     Extraocular Movements: Extraocular movements intact.     Conjunctiva/sclera: Conjunctivae normal.  Neck:     Thyroid : No thyromegaly.     Vascular: No carotid bruit.  Cardiovascular:     Rate and Rhythm: Normal rate and regular rhythm.     Heart sounds: Normal heart sounds. No murmur heard.    No gallop.  Pulmonary:     Effort: No accessory muscle usage or respiratory distress.     Breath sounds: Normal breath sounds.  Abdominal:     General: Bowel sounds are normal. There is no distension.     Palpations: Abdomen is soft.     Tenderness: There is no abdominal tenderness.  Musculoskeletal:     Cervical back: Full passive range of motion without pain.     Right lower leg: No edema.     Left lower leg: No edema.  Lymphadenopathy:     Cervical: No cervical adenopathy.  Skin:    General: Skin is warm.     Capillary Refill: Capillary refill takes less than 2 seconds.  Neurological:     Mental Status: He is alert and oriented to person, place, and time.     Deep Tendon Reflexes: Reflexes are normal and symmetric.     Reflex Scores:      Brachioradialis reflexes are 2+ on the right side and 2+ on the left  side.      Patellar reflexes are 2+ on the right side and 2+ on the left side. Psychiatric:        Attention and Perception: Attention normal.        Mood and Affect: Mood normal. Affect is tearful.        Speech: Speech normal.        Behavior: Behavior normal. Behavior is cooperative.        Thought Content: Thought content normal.     Results for orders placed or performed in visit on 02/26/24  CUP PACEART REMOTE DEVICE CHECK   Collection Time: 02/25/24 11:16 PM  Result Value Ref Range   Date Time Interrogation Session [id number]    Pulse Generator Manufacturer MERM    Pulse Gen Model LNQ22 LINQ II    Pulse Gen Serial Number [id number]    Clinic Name [facility]    Implantable Pulse Generator Type ICM/ILR    Implantable Pulse Generator Implant Date [id number]       Assessment & Plan:   Problem List Items Addressed This Visit       Cardiovascular and Mediastinum   Wolff-Parkinson-White (WPW) syndrome   Chronic, stable. Continue to follow with cardiology and medication regimen as prescribed by them. Recent notes reviewed.      NICM (nonischemic cardiomyopathy) (HCC) - Primary   Chronic, ongoing.  Continue collaboration with cardiology.  Recent notes reviewed.        Heart failure with mid-range ejection fraction (HCC)   Chronic, ongoing.  Continue collaboration with cardiology, recent notes reviewed.  Labs today.      Essential hypertension   Chronic, stable.  BP at goal  today.  Attempted reduction of Losartan  to 50 MG, but he did not tolerate this.  Recommend he monitor BP at least a few mornings a week at home and document.  DASH diet at home.  Continue current medication regimen and adjust as needed. Labs today: CBC, CMP, TSH.  Urine ALB 80 March 2025, continue Losartan  for kidney protection.        Relevant Orders   CBC with Differential/Platelet   Comprehensive metabolic panel with GFR   TSH     Genitourinary   Benign prostatic hyperplasia with  incomplete bladder emptying   Chronic, ongoing.  Followed by urology.  PSA on labs next visit.  Continue current medication regimen.      Relevant Medications   tamsulosin  (FLOMAX ) 0.4 MG CAPS capsule     Other   Nicotine dependence with current use   I have recommended complete cessation of tobacco use. I have discussed various options available for assistance with tobacco cessation including over the counter methods (Nicotine gum, patch and lozenges). We also discussed prescription options (Chantix, Nicotine Inhaler / Nasal Spray). The patient is not interested in pursuing any prescription tobacco cessation options at this time.       Moderate episode of recurrent major depressive disorder (HCC)   Relevant Medications   mirtazapine  (REMERON ) 7.5 MG tablet   hydrOXYzine  (VISTARIL ) 25 MG capsule   Other Relevant Orders   TSH   Ambulatory referral to Psychology   Mixed hyperlipidemia   Chronic, ongoing.  Continue Rosuvastatin  daily and adjust as needed.  Lipid panel today.      Relevant Orders   Lipid Panel w/o Chol/HDL Ratio   Comprehensive metabolic panel with GFR   Anxiety   Chronic, exacerbated by life.  Denies SI/HI.  Start Mirtazapine  7.5 MG at night, has taken in past with benefit.  May also help his appetite and weight.  Vistaril  ordered to take only as needed for increased anxiety or panic.  Educated him on these at length and BLACK BOX warning.  Referral to therapy placed.      Relevant Medications   mirtazapine  (REMERON ) 7.5 MG tablet   hydrOXYzine  (VISTARIL ) 25 MG capsule     Follow up plan: Return in about 4 weeks (around 04/12/2024) for ANXIETY, Depression.

## 2024-03-15 NOTE — Assessment & Plan Note (Signed)
 Chronic, ongoing.  Followed by urology.  PSA on labs next visit.  Continue current medication regimen.

## 2024-03-15 NOTE — Telephone Encounter (Signed)
 Ok for E2C2 to review.   Returned call. Can he provide more details for th reason for referral and also does he know where patient would like to be seen.

## 2024-03-16 ENCOUNTER — Ambulatory Visit: Payer: Self-pay | Admitting: Nurse Practitioner

## 2024-03-16 LAB — CBC WITH DIFFERENTIAL/PLATELET
Basophils Absolute: 0 x10E3/uL (ref 0.0–0.2)
Basos: 1 %
EOS (ABSOLUTE): 0.1 x10E3/uL (ref 0.0–0.4)
Eos: 1 %
Hematocrit: 36.7 % — ABNORMAL LOW (ref 37.5–51.0)
Hemoglobin: 12.4 g/dL — ABNORMAL LOW (ref 13.0–17.7)
Immature Grans (Abs): 0 x10E3/uL (ref 0.0–0.1)
Immature Granulocytes: 0 %
Lymphocytes Absolute: 2 x10E3/uL (ref 0.7–3.1)
Lymphs: 43 %
MCH: 32.5 pg (ref 26.6–33.0)
MCHC: 33.8 g/dL (ref 31.5–35.7)
MCV: 96 fL (ref 79–97)
Monocytes Absolute: 0.3 x10E3/uL (ref 0.1–0.9)
Monocytes: 5 %
Neutrophils Absolute: 2.4 x10E3/uL (ref 1.4–7.0)
Neutrophils: 50 %
Platelets: 180 x10E3/uL (ref 150–450)
RBC: 3.82 x10E6/uL — ABNORMAL LOW (ref 4.14–5.80)
RDW: 13 % (ref 11.6–15.4)
WBC: 4.8 x10E3/uL (ref 3.4–10.8)

## 2024-03-16 LAB — LIPID PANEL W/O CHOL/HDL RATIO
Cholesterol, Total: 126 mg/dL (ref 100–199)
HDL: 58 mg/dL (ref >39)
LDL Chol Calc (NIH): 56 mg/dL (ref 0–99)
Triglycerides: 50 mg/dL (ref 0–149)
VLDL Cholesterol Cal: 12 mg/dL (ref 5–40)

## 2024-03-16 LAB — TSH: TSH: 1.33 u[IU]/mL (ref 0.450–4.500)

## 2024-03-16 NOTE — Progress Notes (Signed)
 Contacted via MyChart  Good afternoon Cato, your labs have returned and are overall stable with exception of mild elevation in hemoglobin and hematocrit, which has been present for some time.  I recommend cutting back on any nicotine use as it can cause elevations in these levels.  We will monitor closely here.  Any questions? We are here if you need us , reach out anytime. Keep being amazing!!  Thank you for allowing me to participate in your care.  I appreciate you. Kindest regards, Waymon Laser

## 2024-03-17 ENCOUNTER — Other Ambulatory Visit: Payer: Self-pay

## 2024-03-17 LAB — COMPREHENSIVE METABOLIC PANEL WITH GFR
ALT: 11 IU/L (ref 0–44)
AST: 14 IU/L (ref 0–40)
Albumin: 4.3 g/dL (ref 3.9–4.9)
Alkaline Phosphatase: 70 IU/L (ref 47–123)
BUN/Creatinine Ratio: 23 (ref 10–24)
BUN: 20 mg/dL (ref 8–27)
Bilirubin Total: 0.3 mg/dL (ref 0.0–1.2)
CO2: 21 mmol/L (ref 20–29)
Calcium: 8.5 mg/dL — ABNORMAL LOW (ref 8.6–10.2)
Chloride: 102 mmol/L (ref 96–106)
Creatinine, Ser: 0.86 mg/dL (ref 0.76–1.27)
Globulin, Total: 3 g/dL (ref 1.5–4.5)
Glucose: 88 mg/dL (ref 70–99)
Potassium: 3.6 mmol/L (ref 3.5–5.2)
Sodium: 140 mmol/L (ref 134–144)
Total Protein: 7.3 g/dL (ref 6.0–8.5)
eGFR: 96 mL/min/1.73 (ref >59)

## 2024-03-17 LAB — SPECIMEN STATUS REPORT

## 2024-03-19 MED ORDER — TAMSULOSIN HCL 0.4 MG PO CAPS: 0.8000 mg | ORAL_CAPSULE | Freq: Every day | ORAL | 3 refills | Status: AC

## 2024-03-19 MED ORDER — MIRTAZAPINE 7.5 MG PO TABS: 7.5000 mg | ORAL_TABLET | Freq: Every day | ORAL | 1 refills | Status: AC

## 2024-03-19 MED ORDER — HYDROXYZINE PAMOATE 25 MG PO CAPS: 25.0000 mg | ORAL_CAPSULE | Freq: Three times a day (TID) | ORAL | 0 refills | Status: AC | PRN

## 2024-03-29 ENCOUNTER — Ambulatory Visit

## 2024-03-29 ENCOUNTER — Telehealth: Payer: Self-pay | Admitting: *Deleted

## 2024-03-29 ENCOUNTER — Ambulatory Visit: Payer: Self-pay | Admitting: Physical Therapy

## 2024-03-29 DIAGNOSIS — I428 Other cardiomyopathies: Secondary | ICD-10-CM

## 2024-03-29 LAB — CUP PACEART REMOTE DEVICE CHECK
Date Time Interrogation Session: 20250928231850
Implantable Pulse Generator Implant Date: 20221228

## 2024-03-29 NOTE — Progress Notes (Signed)
 Complex Care Management Note Care Guide Note  03/29/2024 Name: Jesus Price MRN: [id number] DOB: 10/22/1960   Complex Care Management Outreach Attempts: An unsuccessful telephone outreach was attempted today to offer the patient information about available complex care management services.  Follow Up Plan:  Additional outreach attempts will be made to offer the patient complex care management information and services.   Encounter Outcome:  No Answer  [facility] Care Guide  Direct Dial:[phone] Fax:[phone] Website: [email or web address]

## 2024-03-30 ENCOUNTER — Telehealth: Payer: Self-pay | Admitting: *Deleted

## 2024-03-30 NOTE — Progress Notes (Signed)
 Complex Care Management Note Care Guide Note  03/30/2024 Name: Jyren Cerasoli MRN: [id number] DOB: Oct 21, 1960  Askari Kinley is a 63 y.o. year old male who is a primary care patient of Cannady, Jolene T, NP . The community resource team was consulted for assistance with Food Insecurity and Financial Difficulties related to utilities  SDOH screenings and interventions completed:  Yes     SDOH Interventions Today    Flowsheet Row Most Recent Value  SDOH Interventions   Food Insecurity Interventions Community Resources Provided  [employer] food banks in area and [place]]  Utilities Interventions Community Resources Provided  [employer] is behind]     Care guide performed the following interventions: Patient provided with information about care guide support team and interviewed to confirm resource needs.  Follow Up Plan:  No further follow up planned at this time. The patient has been provided with needed resources.  Encounter Outcome:  Patient Visit Completed Faithlynn Deeley [facility]  [facility] Care Guide  Direct Dial:[phone] Fax:[phone] Website: [email or web address]

## 2024-03-31 ENCOUNTER — Other Ambulatory Visit: Payer: Self-pay | Admitting: Nurse Practitioner

## 2024-03-31 NOTE — Progress Notes (Signed)
 Remote Loop Recorder Transmission

## 2024-04-01 NOTE — Progress Notes (Signed)
 Remote Loop Recorder Transmission

## 2024-04-01 NOTE — Telephone Encounter (Signed)
 Requested Prescriptions  Refused Prescriptions Disp Refills   rosuvastatin  (CRESTOR ) 10 MG tablet [Pharmacy Med Name: ROSUVASTATIN  10MG  TABLETS] 90 tablet 3    Sig: TAKE 1 TABLET(10 MG) BY MOUTH DAILY     Cardiovascular:  Antilipid - Statins 2 Failed - 04/01/2024  4:41 PM      Failed - Lipid Panel in normal range within the last 12 months    Cholesterol, Total  Date Value Ref Range Status  03/15/2024 126 100 - 199 mg/dL Final   LDL Chol Calc (NIH)  Date Value Ref Range Status  03/15/2024 56 0 - 99 mg/dL Final   HDL  Date Value Ref Range Status  03/15/2024 58 >39 mg/dL Final   Triglycerides  Date Value Ref Range Status  03/15/2024 50 0 - 149 mg/dL Final         Passed - Cr in normal range and within 360 days    Creatinine, Ser  Date Value Ref Range Status  03/15/2024 0.86 0.76 - 1.27 mg/dL Final         Passed - Patient is not pregnant      Passed - Valid encounter within last 12 months    Recent Outpatient Visits           2 weeks ago NICM (nonischemic cardiomyopathy) (HCC)   [place] [facility] [place], Melanie T, NP   4 months ago Essential hypertension   [place] [facility] [place], [place] T, NP   5 months ago Moderate episode of recurrent major depressive disorder (HCC)   [place] [facility] [place], Jolene T, NP   7 months ago NICM (nonischemic cardiomyopathy) (HCC)   [place] [facility] [place], Melanie DASEN, NP       Future Appointments             In 4 months Stoioff, Glendia BROCKS, MD [facility] [place]

## 2024-04-02 ENCOUNTER — Ambulatory Visit: Payer: Self-pay | Admitting: Cardiology

## 2024-04-05 ENCOUNTER — Ambulatory Visit: Payer: Self-pay | Admitting: Physical Therapy

## 2024-04-06 ENCOUNTER — Encounter: Payer: Self-pay | Admitting: Licensed Clinical Social Worker

## 2024-04-06 ENCOUNTER — Other Ambulatory Visit: Payer: Self-pay | Admitting: *Deleted

## 2024-04-06 NOTE — Patient Outreach (Signed)
 Phone call to patient to complete scheduled follow up appointment. Patient requested that the appointment be re-scheduled as he was at a doctor's appointment at the time of the call.  Follow up appointment re-scheduled for 04/27/24 1pm.   Vergia Chea, LCSW [place]  [facility], [facility] Licensed Clinical Social Worker  Direct Dial: [phone]

## 2024-04-10 NOTE — Patient Instructions (Signed)
 Be Involved in Caring For Your Health:  Taking Medications When medications are taken as directed, they can greatly improve your health. But if they are not taken as prescribed, they may not work. In some cases, not taking them correctly can be harmful. To help ensure your treatment remains effective and safe, understand your medications and how to take them. Bring your medications to each visit for review by your provider.  Your lab results, notes, and after visit summary will be available on My Chart. We strongly encourage you to use this feature. If lab results are abnormal the clinic will contact you with the appropriate steps. If the clinic does not contact you assume the results are satisfactory. You can always view your results on My Chart. If you have questions regarding your health or results, please contact the clinic during office hours. You can also ask questions on My Chart.  We at [facility] are grateful that you chose [place] to provide your care. We strive to provide evidence-based and compassionate care and are always looking for feedback. If you get a survey from the clinic please complete this so we can hear your opinions.  Managing Anxiety, Adult After being diagnosed with anxiety, you may be relieved to know why you have felt or behaved a certain way. You may also feel overwhelmed about the treatment ahead and what it will mean for your life. With care and support, you can manage your anxiety. How to manage lifestyle changes Understanding the difference between stress and anxiety Although stress can play a role in anxiety, it is not the same as anxiety. Stress is your body's reaction to life changes and events, both good and bad. Stress is often caused by something external, such as a deadline, test, or competition. It normally goes away after the event has ended and will last just a few hours. But, stress can be ongoing and can lead to more than just stress. Anxiety is  caused by something internal, such as imagining a terrible outcome or worrying that something will go wrong that will greatly upset you. Anxiety often does not go away even after the event is over, and it can become a long-term (chronic) worry. Lowering stress and anxiety Talk with your health care provider or a counselor to learn more about lowering anxiety and stress. They may suggest tension-reduction techniques, such as: Music. Spend time creating or listening to music that you enjoy and that inspires you. Mindfulness-based meditation. Practice being aware of your normal breaths while not trying to control your breathing. It can be done while sitting or walking. Centering prayer. Focus on a word, phrase, or sacred image that means something to you and brings you peace. Deep breathing. Expand your stomach and inhale slowly through your nose. Hold your breath for 3-5 seconds. Then breathe out slowly, letting your stomach muscles relax. Self-talk. Learn to notice and spot thought patterns that lead to anxiety reactions. Change those patterns to thoughts that feel peaceful. Muscle relaxation. Take time to tense muscles and then relax them. Choose a tension-reduction technique that fits your lifestyle and personality. These techniques take time and practice. Set aside 5-15 minutes a day to do them. Specialized therapists can offer counseling and training in these techniques. The training to help with anxiety may be covered by some insurance plans. Other things you can do to manage stress and anxiety include: Keeping a stress diary. This can help you learn what triggers your reaction and then learn ways  to manage your response. Thinking about how you react to certain situations. You may not be able to control everything, but you can control your response. Making time for activities that help you relax and not feeling guilty about spending your time in this way. Doing visual imagery. This involves  imagining or creating mental pictures to help you relax. Practicing yoga. Through yoga poses, you can lower tension and relax.  Medicines Medicines for anxiety include: Antidepressant medicines. These are usually prescribed for long-term daily control. Anti-anxiety medicines. These may be added in severe cases, especially when panic attacks occur. When used together, medicines, psychotherapy, and tension-reduction techniques may be the most effective treatment. Relationships Relationships can play a big part in helping you recover. Spend more time connecting with trusted friends and family members. Think about going to couples counseling if you have a partner, taking family education classes, or going to family therapy. Therapy can help you and others better understand your anxiety. How to recognize changes in your anxiety Everyone responds differently to treatment for anxiety. Recovery from anxiety happens when symptoms lessen and stop interfering with your daily life at home or work. This may mean that you will start to: Have better concentration and focus. Worry will interfere less in your daily thinking. Sleep better. Be less irritable. Have more energy. Have improved memory. Try to recognize when your condition is getting worse. Contact your provider if your symptoms interfere with home or work and you feel like your condition is not improving. Follow these instructions at home: Activity Exercise. Adults should: Exercise for at least 150 minutes each week. The exercise should increase your heart rate and make you sweat (moderate-intensity exercise). Do strengthening exercises at least twice a week. Get the right amount and quality of sleep. Most adults need 7-9 hours of sleep each night. Lifestyle  Eat a healthy diet that includes plenty of vegetables, fruits, whole grains, low-fat dairy products, and lean protein. Do not eat a lot of foods that are high in fats, added sugars, or salt  (sodium). Make choices that simplify your life. Do not use any products that contain nicotine or tobacco. These products include cigarettes, chewing tobacco, and vaping devices, such as e-cigarettes. If you need help quitting, ask your provider. Avoid caffeine, alcohol, and certain over-the-counter cold medicines. These may make you feel worse. Ask your pharmacist which medicines to avoid. General instructions Take over-the-counter and prescription medicines only as told by your provider. Keep all follow-up visits. This is to make sure you are managing your anxiety well or if you need more support. Where to find support You can get help and support from: Self-help groups. Online and [employer]. A trusted spiritual leader. Couples counseling. Family education classes. Family therapy. Where to find more information You may find that joining a support group helps you deal with your anxiety. The following sources can help you find counselors or support groups near you: Mental Health America: mentalhealthamerica.net Anxiety and Depression Association of [place] (ADAA): adaa.org The [employer] on Mental Illness (NAMI): nami.org Contact a health care provider if: You have a hard time staying focused or finishing tasks. You spend many hours a day feeling worried about everyday life. You are very tired because you cannot stop worrying. You start to have headaches or often feel tense. You have chronic nausea or diarrhea. Get help right away if: Your heart feels like it is racing. You have shortness of breath. You have thoughts of hurting yourself or others. Get help  right away if you feel like you may hurt yourself or others, or have thoughts about taking your own life. Go to your nearest emergency room or: Call 911. Call the National Suicide Prevention Lifeline at 765-482-1593 or 988. This is open 24 hours a day. Text the Crisis Text Line at 504 124 9896. This information is not  intended to replace advice given to you by your health care provider. Make sure you discuss any questions you have with your health care provider. Document Revised: 03/26/2022 Document Reviewed: 10/08/2020 Elsevier Patient Education  2024 [employer].

## 2024-04-12 ENCOUNTER — Encounter: Payer: Self-pay | Admitting: Nurse Practitioner

## 2024-04-12 ENCOUNTER — Ambulatory Visit (INDEPENDENT_AMBULATORY_CARE_PROVIDER_SITE_OTHER): Admitting: Nurse Practitioner

## 2024-04-12 VITALS — BP 103/65 | HR 52 | Temp 98.1°F | Resp 15 | Ht 67.01 in | Wt 117.8 lb

## 2024-04-12 DIAGNOSIS — F17200 Nicotine dependence, unspecified, uncomplicated: Secondary | ICD-10-CM | POA: Diagnosis not present

## 2024-04-12 DIAGNOSIS — F331 Major depressive disorder, recurrent, moderate: Secondary | ICD-10-CM | POA: Diagnosis not present

## 2024-04-12 DIAGNOSIS — F419 Anxiety disorder, unspecified: Secondary | ICD-10-CM | POA: Diagnosis not present

## 2024-04-12 NOTE — Progress Notes (Signed)
 BP 103/65 (BP Location: Left Arm, Patient Position: Sitting, Cuff Size: Normal)   Pulse (!) 52   Temp 98.1 F (36.7 C) (Oral)   Resp 15   Ht 5' 7.01 (1.702 m)   Wt 117 lb 12.8 oz (53.4 kg)   SpO2 98%   BMI 18.45 kg/m    Subjective:    Patient ID: Jesus Price, male    DOB: 01-29-1961, 63 y.o.   MRN: [id number]  HPI: Jesus Price is a 63 y.o. male  Chief Complaint  Patient presents with   Anxiety/Depression    Has been taking the medication and it seems to be helping. Feels things are improving in the sense he is able to keep things more to himself.   ANXIETY/STRESS Started on Mirtazapine  7.5 MG on 03/19/24 for mood, sleep, and weight. Has Vistaril  to take as needed, used it one time. He reports these have made him feel pretty good. Has not noticed any side effects. Reports not feeling as overwhelmed and not getting same feelings. SW with [facility] had reached out to him for initial therapy. A referral was placed to therapy, but he has not scheduled. Duration: improving Anxious mood: improving  Excessive worrying: a little on occasion, especially family Irritability: no  Sweating: no Nausea: no Palpitations:no Hyperventilation: no Panic attacks: no Agoraphobia: no  Obscessions/compulsions: no Depressed mood: occasional, improving    05/04/24    3:57 PM 03/15/2024    3:10 PM 10/23/2023    9:51 AM 10/06/2023    3:06 PM 09/30/2023    3:25 PM  Depression screen PHQ 2/9  Decreased Interest 0 2 1 3 3   Down, Depressed, Hopeless 0 2 1 0 3  PHQ - 2 Score 0 4 2 3 6   Altered sleeping 0 1 1 1  0  Tired, decreased energy 1 3 1 1 3   Change in appetite 0 0 0 1 2  Feeling bad or failure about yourself  0 0 0 0 0  Trouble concentrating 0 0 0 0 0  Moving slowly or fidgety/restless 0 0 0 0 0  Suicidal thoughts 0 0 0 0 0  PHQ-9 Score 1 8 4 6 11   Difficult doing work/chores Not difficult at all Not difficult at all  Not difficult at all Somewhat difficult  Anhedonia:  no Weight changes: no Insomnia: none Hypersomnia: no Fatigue/loss of energy: no Feelings of worthlessness: no Feelings of guilt: no Impaired concentration/indecisiveness: no Suicidal ideations: no  Crying spells: no Recent Stressors/Life Changes: yes   Relationship problems: no   Family stress: yes     Financial stress: no    Job stress: no    Recent death/loss: no     05-04-2024    3:57 PM 03/15/2024    3:10 PM 10/06/2023    3:06 PM 08/13/2023    3:13 PM  GAD 7 : Generalized Anxiety Score  Nervous, Anxious, on Edge 1 1 0 1  Control/stop worrying 0 3 0 1  Worry too much - different things 0 2 0 1  Trouble relaxing 0 1 1 0  Restless 0 0 0 0  Easily annoyed or irritable 1 1 1 2   Afraid - awful might happen 0 0 0 1  Total GAD 7 Score 2 8 2 6   Anxiety Difficulty Not difficult at all  Somewhat difficult Not difficult at all   Relevant past medical, surgical, family and social history reviewed and updated as indicated. Interim medical history since our last visit reviewed.  Allergies and medications reviewed and updated.  Review of Systems  Constitutional:  Negative for activity change, diaphoresis, fatigue and fever.  Respiratory:  Negative for cough, chest tightness, shortness of breath and wheezing.   Cardiovascular:  Negative for chest pain, palpitations and leg swelling.  Gastrointestinal: Negative.   Neurological: Negative.   Psychiatric/Behavioral:  Negative for decreased concentration, self-injury, sleep disturbance and suicidal ideas. The patient is not nervous/anxious.     Per HPI unless specifically indicated above     Objective:    BP 103/65 (BP Location: Left Arm, Patient Position: Sitting, Cuff Size: Normal)   Pulse (!) 52   Temp 98.1 F (36.7 C) (Oral)   Resp 15   Ht 5' 7.01 (1.702 m)   Wt 117 lb 12.8 oz (53.4 kg)   SpO2 98%   BMI 18.45 kg/m   Wt Readings from Last 3 Encounters:  04/12/24 117 lb 12.8 oz (53.4 kg)  03/15/24 115 lb 6.4 oz (52.3 kg)   12/24/23 117 lb 4 oz (53.2 kg)    Physical Exam Vitals and nursing note reviewed.  Constitutional:      General: He is awake. He is not in acute distress.    Appearance: He is well-developed, well-groomed and underweight. He is not ill-appearing or toxic-appearing.  HENT:     Head: Normocephalic.     Right Ear: Hearing and external ear normal.     Left Ear: Hearing and external ear normal.  Eyes:     General: Lids are normal.     Extraocular Movements: Extraocular movements intact.     Conjunctiva/sclera: Conjunctivae normal.  Neck:     Thyroid : No thyromegaly.     Vascular: No carotid bruit.  Cardiovascular:     Rate and Rhythm: Normal rate and regular rhythm.     Heart sounds: Normal heart sounds. No murmur heard.    No gallop.  Pulmonary:     Effort: No accessory muscle usage or respiratory distress.     Breath sounds: Normal breath sounds.  Abdominal:     General: Bowel sounds are normal. There is no distension.     Palpations: Abdomen is soft.     Tenderness: There is no abdominal tenderness.  Musculoskeletal:     Cervical back: Full passive range of motion without pain.     Right lower leg: No edema.     Left lower leg: No edema.  Lymphadenopathy:     Cervical: No cervical adenopathy.  Skin:    General: Skin is warm.     Capillary Refill: Capillary refill takes less than 2 seconds.  Neurological:     Mental Status: He is alert and oriented to person, place, and time.     Deep Tendon Reflexes: Reflexes are normal and symmetric.     Reflex Scores:      Brachioradialis reflexes are 2+ on the right side and 2+ on the left side.      Patellar reflexes are 2+ on the right side and 2+ on the left side. Psychiatric:        Attention and Perception: Attention normal.        Mood and Affect: Mood normal.        Speech: Speech normal.        Behavior: Behavior normal. Behavior is cooperative.        Thought Content: Thought content normal.    Results for orders placed  or performed in visit on 03/29/24  CUP PACEART REMOTE DEVICE CHECK  Collection Time: 03/28/24 11:18 PM  Result Value Ref Range   Date Time Interrogation Session [id number]    Pulse Generator Manufacturer MER[employer]    Pulse Gen Model LNQ22 LINQ II    Pulse Gen Serial Number [id number]    Clinic Name [facility]    Implantable Pulse Generator Type ICM/ILR    Implantable Pulse Generator Implant Date [id number]       Assessment & Plan:   Problem List Items Addressed This Visit       Other   Nicotine dependence with current use   I have recommended complete cessation of tobacco use. I have discussed various options available for assistance with tobacco cessation including over the counter methods (Nicotine gum, patch and lozenges). We also discussed prescription options (Chantix, Nicotine Inhaler / Nasal Spray). The patient is not interested in pursuing any prescription tobacco cessation options at this time.       Relevant Orders   Ambulatory Referral Lung Cancer Screening [facility]   Moderate episode of recurrent major depressive disorder (HCC) - Primary   Chronic and symptoms of anxiety/depression improved with Mirtazapine  on board.  Continue this at 7.5 MG nightly and adjust as needed.  Benefit to mood, weight, and sleep.  Vistaril  only as needed.  Denies SI/HI.  New referral to therapy placed as he would like to attend.  Continue to work with [facility] SW.      Relevant Orders   Ambulatory referral to Psychology   Anxiety   Refer to depression plan of care.      Relevant Orders   Ambulatory referral to Psychology     Follow up plan: Return in about 6 months (around 10/06/2024) for Annual Physical after 10/05/24.

## 2024-04-12 NOTE — Assessment & Plan Note (Signed)
 I have recommended complete cessation of tobacco use. I have discussed various options available for assistance with tobacco cessation including over the counter methods (Nicotine gum, patch and lozenges). We also discussed prescription options (Chantix, Nicotine Inhaler / Nasal Spray). The patient is not interested in pursuing any prescription tobacco cessation options at this time.

## 2024-04-12 NOTE — Assessment & Plan Note (Signed)
 Chronic and symptoms of anxiety/depression improved with Mirtazapine  on board.  Continue this at 7.5 MG nightly and adjust as needed.  Benefit to mood, weight, and sleep.  Vistaril  only as needed.  Denies SI/HI.  New referral to therapy placed as he would like to attend.  Continue to work with [employer] [facility] SW.

## 2024-04-12 NOTE — Assessment & Plan Note (Signed)
 Refer to depression plan of care.

## 2024-04-13 ENCOUNTER — Ambulatory Visit: Admitting: Nurse Practitioner

## 2024-04-20 ENCOUNTER — Telehealth: Payer: Self-pay

## 2024-04-20 DIAGNOSIS — Z122 Encounter for screening for malignant neoplasm of respiratory organs: Secondary | ICD-10-CM

## 2024-04-20 DIAGNOSIS — Z87891 Personal history of nicotine dependence: Secondary | ICD-10-CM

## 2024-04-20 DIAGNOSIS — F17210 Nicotine dependence, cigarettes, uncomplicated: Secondary | ICD-10-CM

## 2024-04-20 NOTE — Telephone Encounter (Signed)
 Lung Cancer Screening Narrative/Criteria Questionnaire (Cigarette Smokers Only- No Cigars/Pipes/vapes)   Jesus Price   SDMV:04/27/2024 at 2:00 pm Kristen        Nov 26, 1960                           LDCT: 05/07/2024 at 1:00 pm at [facility]    63 y.o.   Phone: [phone]  Lung Screening Narrative (confirm age 37-77 yrs Medicare / 50-80 yrs Private pay insurance)   Insurance information: HTA   Referring Provider: Valerio, NP   This screening involves an initial phone call with a team member from our program. It is called a shared decision making visit. The initial meeting is required by  insurance and Medicare to make sure you understand the program. This appointment takes about 15-20 minutes to complete. You will complete the screening scan at your scheduled date/time.  This scan takes about 5-10 minutes to complete. You can eat and drink normally before and after the scan.  Criteria questions for Lung Cancer Screening:   Are you a current or former smoker? Current Age began smoking: 14   If you are a former smoker, what year did you quit smoking?Never quit over one 1 year (within 15 yrs)   To calculate your smoking history, I need an accurate estimate of how many packs of cigarettes you smoked per day and for how many years. (Not just the number of PPD you are now smoking)   Years smoking 49 x Packs per day 1.5 = Pack years 73.5   (at least 20 pack yrs)   (Make sure they understand that we need to know how much they have smoked in the past, not just the number of PPD they are smoking now)  Do you have a personal history of cancer?  No    Do you have a family history of cancer? No  Are you coughing up blood?  No  Have you had unexplained weight loss of 15 lbs or more in the last 6 months? No  It looks like you meet all criteria.  When would be a good time for us  to schedule you for this screening?   Additional information: N/A

## 2024-04-22 ENCOUNTER — Other Ambulatory Visit: Payer: Self-pay | Admitting: Urology

## 2024-04-23 ENCOUNTER — Ambulatory Visit: Payer: Self-pay | Admitting: Podiatry

## 2024-04-27 ENCOUNTER — Encounter: Payer: Self-pay | Admitting: *Deleted

## 2024-04-27 ENCOUNTER — Other Ambulatory Visit: Payer: Self-pay | Admitting: *Deleted

## 2024-04-27 ENCOUNTER — Ambulatory Visit: Admitting: *Deleted

## 2024-04-27 DIAGNOSIS — F17210 Nicotine dependence, cigarettes, uncomplicated: Secondary | ICD-10-CM

## 2024-04-27 NOTE — Patient Instructions (Signed)

## 2024-04-27 NOTE — Progress Notes (Signed)
 Virtual Visit via Telephone Note  I connected with Jesus Price on 04/27/24 at  2:00 PM EDT by telephone and verified that I am speaking with the correct person using two identifiers.  Location: Patient: in home Provider: [address], [place], [place], [address]     Shared Decision Making Visit Lung Cancer Screening Program [phone])   Eligibility: Age 63 y.o. Pack Years Smoking History Calculation 73.5 (# packs/per year x # years smoked) Recent History of coughing up blood  no Unexplained weight loss? no ( >Than 15 pounds within the last 6 months ) Prior History Lung / other cancer no (Diagnosis within the last 5 years already requiring surveillance chest CT Scans). Smoking Status Current Smoker Former Smokers: Years since quit:  NA  Quit Date: NA  Visit Components: Discussion included one or more decision making aids. yes Discussion included risk/benefits of screening. yes Discussion included potential follow up diagnostic testing for abnormal scans. yes Discussion included meaning and risk of over diagnosis. yes Discussion included meaning and risk of False Positives. yes Discussion included meaning of total radiation exposure. yes  Counseling Included: Importance of adherence to annual lung cancer LDCT screening. yes Impact of comorbidities on ability to participate in the program. yes Ability and willingness to under diagnostic treatment. yes  Smoking Cessation Counseling: Current Smokers:  Discussed importance of smoking cessation. yes Information about tobacco cessation classes and interventions provided to patient. yes Patient provided with ticket for LDCT Scan. yes Symptomatic Patient. no  Counseling NA Diagnosis Code: Tobacco Use Z72.0 Asymptomatic Patient yes  Counseling (Intermediate counseling: > three minutes counseling) H9563  Counseled patient 4 minutes regarding tobacco use.   Former Smokers:  Discussed the importance of maintaining  cigarette abstinence. yes Diagnosis Code: Personal History of Nicotine Dependence. S12.108 Information about tobacco cessation classes and interventions provided to patient. Yes Patient provided with ticket for LDCT Scan. yes Written Order for Lung Cancer Screening with LDCT placed in Epic. Yes (CT Chest Lung Cancer Screening Low Dose W/O CM) [id number] Z12.2-Screening of respiratory organs Z87.891-Personal history of nicotine dependence   Josette Ranger, RN 04/27/24

## 2024-04-27 NOTE — Patient Instructions (Signed)
 Visit Information  Thank you for taking time to visit with me today. Please don't hesitate to contact me if I can be of assistance to you before our next scheduled appointment.  Our next appointment is by telephone on 05/14/24 at 3pm Please call the care guide team at [phone] if you need to cancel or reschedule your appointment.   Following is a copy of your care plan:   Goals Addressed             This Visit's Progress    VBCI Social Work Care Plan-LCSW       Problems:   Depression   and Anxiety  CSW Clinical Goal(s):   Over the next 30 days the Patient  will follow up with mental health therapist and psychiatrist as directed by Social Work evidenced by patient report of appointments made  Interventions:  Mental Health:  Evaluation of current treatment plan related to depression and anxiety Confirmed anxiety and depression symptoms related to medical condition and financial strain  Active listening / Reflection utilized Mining [employer] reviewed Discussed referral for psychiatry: patient has referral for psychiatry with Dr. Viviane Drone patient agreeable to call and schedule initial appointment Discussed referral options to connect for ongoing therapy: CSW will request referral as original referral has been closed Benefits of ongoing mental health follow up discussed Emotional Support Provided PHQ2/PHQ9 completed score 8 GAD7 completed-score 4 Solution-Focused Strategies employed:medication management, focusing  on controllable factors, listening to music, spiritual practices, talking to select family members, taking  care of his dog, operating from gratitude  Suicidal Ideation/Homicidal Ideation assessed: patient denied thoughts of harm to self or others  Patient Goals/Self-Care Activities:  Call Dr. Leonarda office  to schedule your  appointment for medication management Continue taking your medication as prescribed.    Plan:   Telephone follow up appointment  with care management team member scheduled for:  05/14/24 3pm        Please call the Suicide and Crisis Lifeline: 988 call the [place]  National Suicide Prevention Lifeline: 579-043-8581 or TTY: 912-784-3581 TTY 939-830-7422) to talk to a trained counselor call 1-800-273-TALK (toll free, 24 hour hotline) call 911 if you are experiencing a Mental Health or Behavioral Health Crisis or need someone to talk to.  Patient verbalized understanding of Care plan and visit instructions communicated this visit  Alvina Strother, LCSW [place]  [facility], [facility] Licensed Clinical Social Worker  Direct Dial: [phone]

## 2024-04-27 NOTE — Patient Outreach (Signed)
 Complex Care Management   Visit Note  04/27/2024  Name:  Jesus Price MRN: [id number] DOB: [birth year]  Situation: Referral received for Complex Care Management related to Mental/Behavioral Health diagnosis depression and anxiety I obtained verbal consent from Patient.  Visit completed with Patient  on the phone  Background:   Past Medical History:  Diagnosis Date   Arthritis    Family history of sudden cardiac death    Glaucoma    Heart failure with mid-range ejection fraction (HCC)    a. 10/2023 Echo: EF 45-50%, no rwma, nl RV size/fxn.   Hypertension    Implantable loop recorder present    a. MDT Linq - placed 09/2017.   NICM (nonischemic cardiomyopathy) (HCC)    a. 2012 Echo: EF 40%; b. 05/2016 Cath: nl cors; c. 05/2016 Echo: EF 50%; d. 09/2017 cMRI [facility]): EF 39%, nl RV size/fxn, no signif valvular dzs. No scar/infiltrative process; e. 12/2020 Echo:  EF 45-50%; f. 10/2023 Echo: EF 45-50%, no rwma, nl RV size/fxn.   Shortened PR interval w/ palpitations    a. 09/2017 EP Study [facility]): no evidence of dual AV nodal physiology/VA conduction/accessory pathway/inducible SVT.  MDT Linq placed.   Situational depression 06/07/2016    Assessment: Patient Reported Symptoms:  Cognitive Cognitive Status: Alert and oriented to person, place, and time, Normal speech and language skills, Insightful and able to interpret abstract concepts Cognitive/Intellectual Conditions Management [RPT]: None reported or documented in medical history or problem list   Health Maintenance Behaviors: None Health Facilitated by: Stress management  Neurological Neurological Review of Symptoms: No symptoms reported    HEENT HEENT Symptoms Reported: No symptoms reported      Cardiovascular Cardiovascular Symptoms Reported: Dizziness Does patient have uncontrolled Hypertension?: No Cardiovascular Management Strategies: Activity, Medical device, Routine screening Cardiovascular Comment: continues to be followed  by Dr. 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 Musculoskeletal Management Strategies: Medication therapy Falls in the past year?: No    Psychosocial Psychosocial Symptoms Reported: Anxiety - if selected complete GAD Additional Psychological Details: managing anxiety well, history of trauma, I have strong a mind Behavioral Management Strategies: Coping strategies Behavioral Health Self-Management Outcome: 4 (good) Major Change/Loss/Stressor/Fears (CP): Medical condition, self Behaviors When Feeling Stressed/Fearful: medication management, focus on controllable things, music, read the bible, talks to friends, takes care of  his dogs, operating from gratitude Techniques to [place] with Loss/Stress/Change: Counseling, Spiritual practice(s), Diversional activities Quality of Family Relationships: helpful, involved    04/27/2024    PHQ2-9 Depression Screening   Little interest or pleasure in doing things More than half the days  Feeling down, depressed, or hopeless Nearly every day  PHQ-2 - Total Score 5  Trouble falling or staying asleep, or sleeping too much Several days  Feeling tired or having little energy Several days  Poor appetite or overeating  Not at all  Feeling bad about yourself - or that you are a failure or have let yourself or your family down Not at all  Trouble concentrating on things, such as reading the newspaper or  watching television Several days  Moving or speaking so slowly that other people could have noticed.  Or the opposite - being so fidgety or restless that you have been moving around a lot more than usual Not at all  Thoughts that you would be better off dead, or hurting yourself in some way Not at all  PHQ2-9 Total Score 8  If you checked off any problems, how difficult have these problems made it for you to do your work, take care of things at home, or get along with other people Somewhat difficult  Depression Interventions/Treatment Referral to Psychiatry, Counseling    There were no vitals filed for this visit.  Medications Reviewed Today     Reviewed by Ermalinda Lenn HERO, LCSW (Social Worker) on 04/27/24 at 1307  Med List Status: <None>   Medication Order Taking? Sig Documenting Provider Last Dose Status Informant  aspirin  EC 81 MG tablet [id number] Yes Take 1 tablet (81 mg total) by mouth daily. Swallow whole. Cannady, Jolene T, NP  Active   CARDIZEM  CD 180 MG 24 hr capsule [id number] Yes Take 1 capsule (180 mg total) by mouth daily. Vivienne Lonni Ingle, NP  Active   carvedilol  (COREG ) 6.25 MG tablet [id number] Yes Take 1 tablet (6.25 mg total) by mouth 2 (two) times daily. Darliss Rogue, MD  Active   COSOPT 2-0.5 % ophthalmic solution [id number] Yes 1 drop 2 (two) times daily. [provider]  Active   fluticasone (FLONASE) 50 MCG/ACT nasal spray [id number] Yes Place 1 spray into both nostrils daily. [provider]  Active   hydrOXYzine  (VISTARIL ) 25 MG capsule [id number] Yes Take 1 capsule (25 mg total) by mouth every 8 (eight) hours as needed for anxiety. Cannady, Jolene T, NP  Active   ibuprofen  (ADVIL ) 600 MG tablet [id number] Yes Take 1 tablet (600 mg total) by mouth every 6 (six) hours as needed. Corlis Burnard DEL, NP  Active   latanoprost (XALATAN) 0.005 % ophthalmic solution [id number] Yes Place 1 drop into both eyes at bedtime. [provider]  Active    meloxicam  (MOBIC ) 15 MG tablet [id number] Yes Take 1 tablet (15 mg total) by mouth daily. Cannady, Jolene T, NP  Active   methocarbamol  (ROBAXIN ) 500 MG tablet [id number] Yes Take 1 tablet (500 mg total) by mouth every 6 (six) hours as needed for muscle spasms. Cannady, Jolene T, NP  Active   mirtazapine  (REMERON ) 7.5 MG tablet [id number] Yes Take 1 tablet (7.5 mg total) by mouth at bedtime. Cannady, Jolene T, NP  Active   rosuvastatin  (CRESTOR ) 10 MG tablet [id number] Yes Take 1 tablet (10 mg total) by mouth daily. Darliss Rogue, MD  Active   sacubitril -valsartan  (ENTRESTO ) 49-51 MG [id number] Yes Take 1 tablet by mouth 2 (two) times daily. Riddle, Suzann, NP  Active   sildenafil  (VIAGRA ) 100 MG tablet [id number] Yes TAKE ONE TABLET BY MOUTH ONE TIME DAILY 1 HOUR PRIOR TO INTERCOURSE Stoioff, Glendia BROCKS, MD  Active   tamsulosin  (FLOMAX ) 0.4 MG CAPS capsule [id number] Yes Take 2 capsules (0.8 mg total) by mouth daily after supper. Valerio Melanie DASEN, NP  Active             Recommendation:   PCP Follow-up Ongoing Mental Health follow up  Follow Up Plan:   Telephone follow up appointment date/time:  05/14/24 3pm  Royann Wildasin, LCSW [place]  [facility], [facility] Licensed Clinical Social Worker  Direct Dial: [phone]

## 2024-04-28 NOTE — Addendum Note (Signed)
 Addended by: Jolicia Delira T on: 04/28/2024 12:32 PM   Modules accepted: Orders

## 2024-04-29 ENCOUNTER — Ambulatory Visit: Payer: Self-pay | Admitting: Cardiology

## 2024-04-29 ENCOUNTER — Ambulatory Visit (INDEPENDENT_AMBULATORY_CARE_PROVIDER_SITE_OTHER): Payer: Self-pay

## 2024-04-29 DIAGNOSIS — I428 Other cardiomyopathies: Secondary | ICD-10-CM

## 2024-04-29 LAB — CUP PACEART REMOTE DEVICE CHECK
Date Time Interrogation Session: 20251029231729
Implantable Pulse Generator Implant Date: 20221228

## 2024-05-04 NOTE — Progress Notes (Signed)
 Remote Loop Recorder Transmission

## 2024-05-07 ENCOUNTER — Ambulatory Visit
Admission: RE | Admit: 2024-05-07 | Discharge: 2024-05-07 | Disposition: A | Discharge status: Home / Self Care | Source: Ambulatory Visit | Attending: Acute Care | Admitting: Acute Care

## 2024-05-07 DIAGNOSIS — Z122 Encounter for screening for malignant neoplasm of respiratory organs: Secondary | ICD-10-CM | POA: Diagnosis not present

## 2024-05-07 DIAGNOSIS — F17210 Nicotine dependence, cigarettes, uncomplicated: Secondary | ICD-10-CM | POA: Diagnosis not present

## 2024-05-07 DIAGNOSIS — Z87891 Personal history of nicotine dependence: Secondary | ICD-10-CM | POA: Insufficient documentation

## 2024-05-14 ENCOUNTER — Other Ambulatory Visit: Payer: Self-pay | Admitting: *Deleted

## 2024-05-14 ENCOUNTER — Other Ambulatory Visit: Payer: Self-pay

## 2024-05-14 DIAGNOSIS — Z87891 Personal history of nicotine dependence: Secondary | ICD-10-CM

## 2024-05-14 DIAGNOSIS — F17210 Nicotine dependence, cigarettes, uncomplicated: Secondary | ICD-10-CM

## 2024-05-14 DIAGNOSIS — Z122 Encounter for screening for malignant neoplasm of respiratory organs: Secondary | ICD-10-CM

## 2024-05-14 NOTE — Patient Outreach (Signed)
 Complex Care Management   Visit Note  05/14/2024  Name:  Jesus Price MRN: [id number] DOB: 1960/09/01  Situation: Referral received for Complex Care Management related to Mental/Behavioral Health diagnosis anxiety I obtained verbal consent from Patient.  Visit completed with Patient  on the phone  Background:   Past Medical History:  Diagnosis Date   Arthritis    Family history of sudden cardiac death    Glaucoma    Heart failure with mid-range ejection fraction (HCC)    a. 10/2023 Echo: EF 45-50%, no rwma, nl RV size/fxn.   Hypertension    Implantable loop recorder present    a. MDT Linq - placed 09/2017.   NICM (nonischemic cardiomyopathy) (HCC)    a. 2012 Echo: EF 40%; b. 05/2016 Cath: nl cors; c. 05/2016 Echo: EF 50%; d. 09/2017 cMRI [facility]): EF 39%, nl RV size/fxn, no signif valvular dzs. No scar/infiltrative process; e. 12/2020 Echo:  EF 45-50%; f. 10/2023 Echo: EF 45-50%, no rwma, nl RV size/fxn.   Shortened PR interval w/ palpitations    a. 09/2017 EP Study [facility]): no evidence of dual AV nodal physiology/VA conduction/accessory pathway/inducible SVT.  MDT Linq placed.   Situational depression 06/07/2016    Assessment: Patient Reported Symptoms:  Cognitive Cognitive Status: Alert and oriented to person, place, and time, Normal speech and language skills, Insightful and able to interpret abstract concepts Cognitive/Intellectual Conditions Management [RPT]: None reported or documented in medical history or problem list   Health Maintenance Behaviors: None Healing Pattern: Unsure Health Facilitated by: Stress management  Neurological Neurological Review of Symptoms: No symptoms reported    HEENT HEENT Symptoms Reported: No symptoms reported (has cataracts on eye for the last 5 or 6 years-removed it but one eye now has glaucoma-has had 3 operations) HEENT Management Strategies: Routine screening HEENT Comment: uses eye drops    Cardiovascular Cardiovascular Symptoms  Reported: Dizziness Does patient have uncontrolled Hypertension?: No Cardiovascular Comment: follows up with cardiologist-last seen on 05/07/24-imaging done-does not have the results yet  Respiratory Respiratory Symptoms Reported: No symptoms reported    Endocrine Is patient diabetic?: No    Gastrointestinal Gastrointestinal Symptoms Reported: No symptoms reported      Genitourinary Genitourinary Symptoms Reported: No symptoms reported    Integumentary Integumentary Symptoms Reported: No symptoms reported    Musculoskeletal Musculoskelatal Symptoms Reviewed: Back pain Additional Musculoskeletal Details: back pain unchanged        Psychosocial Psychosocial Symptoms Reported: Anxiety - if selected complete GAD Behavioral Management Strategies: Coping strategies Behavioral Health Self-Management Outcome: 4 (good) Behavioral Health Comment: Patient reports having an appointment with a therapist on 05/31/24 however needs to complete the intake paper work before appointment. Major Change/Loss/Stressor/Fears (CP): Medical condition, self Behaviors When Feeling Stressed/Fearful: medication management, focus on controllable things, listening to soothing music, spiritual practices, taking care of dogs, gratitude practices Techniques to [employer] with Loss/Stress/Change: Counseling, Spiritual practice(s), Diversional activities Quality of Family Relationships: helpful, involved Do you feel physically threatened by others?: No    05/14/2024    PHQ2-9 Depression Screening   Little interest or pleasure in doing things    Feeling down, depressed, or hopeless    PHQ-2 - Total Score    Trouble falling or staying asleep, or sleeping too much    Feeling tired or having little energy    Poor appetite or overeating     Feeling bad about yourself - or that you are a failure or have let yourself or your family down    Trouble  concentrating on things, such as reading the newspaper or watching television     Moving or speaking so slowly that other people could have noticed.  Or the opposite - being so fidgety or restless that you have been moving around a lot more than usual    Thoughts that you would be better off dead, or hurting yourself in some way    PHQ2-9 Total Score    If you checked off any problems, how difficult have these problems made it for you to do your work, take care of things at home, or get along with other people    Depression Interventions/Treatment      There were no vitals filed for this visit.    Medications Reviewed Today     Reviewed by Ermalinda Lenn HERO, LCSW (Social Worker) on 05/14/24 at 1516  Med List Status: <None>   Medication Order Taking? Sig Documenting Provider Last Dose Status Informant  aspirin  EC 81 MG tablet [id number] Yes Take 1 tablet (81 mg total) by mouth daily. Swallow whole. Cannady, Jolene T, NP  Active   CARDIZEM  CD 180 MG 24 hr capsule [id number] Yes Take 1 capsule (180 mg total) by mouth daily. Vivienne Lonni Ingle, NP  Active   carvedilol  (COREG ) 6.25 MG tablet [id number] Yes Take 1 tablet (6.25 mg total) by mouth 2 (two) times daily. Darliss Rogue, MD  Active   COSOPT 2-0.5 % ophthalmic solution [id number] Yes 1 drop 2 (two) times daily. [provider]  Active   fluticasone (FLONASE) 50 MCG/ACT nasal spray [id number] Yes Place 1 spray into both nostrils daily. [provider]  Active   hydrOXYzine  (VISTARIL ) 25 MG capsule [id number] Yes Take 1 capsule (25 mg total) by mouth every 8 (eight) hours as needed for anxiety. Cannady, Jolene T, NP  Active   ibuprofen  (ADVIL ) 600 MG tablet [id number] Yes Take 1 tablet (600 mg total) by mouth every 6 (six) hours as needed. Corlis Burnard DEL, NP  Active   latanoprost (XALATAN) 0.005 % ophthalmic solution [id number] Yes Place 1 drop into both eyes at bedtime. [provider]  Active   meloxicam  (MOBIC ) 15 MG tablet [id number] Yes Take 1 tablet (15 mg total) by mouth daily. Cannady,  Jolene T, NP  Active   methocarbamol  (ROBAXIN ) 500 MG tablet [id number] Yes Take 1 tablet (500 mg total) by mouth every 6 (six) hours as needed for muscle spasms. Cannady, Jolene T, NP  Active   mirtazapine  (REMERON ) 7.5 MG tablet [id number] Yes Take 1 tablet (7.5 mg total) by mouth at bedtime. Cannady, Jolene T, NP  Active   rosuvastatin  (CRESTOR ) 10 MG tablet [id number] Yes Take 1 tablet (10 mg total) by mouth daily. Darliss Rogue, MD  Active   sacubitril -valsartan  (ENTRESTO ) 49-51 MG [id number] Yes Take 1 tablet by mouth 2 (two) times daily. Riddle, Suzann, NP  Active   sildenafil  (VIAGRA ) 100 MG tablet [id number] Yes TAKE ONE TABLET BY MOUTH ONE TIME DAILY 1 HOUR PRIOR TO INTERCOURSE Stoioff, Glendia BROCKS, MD  Active   tamsulosin  (FLOMAX ) 0.4 MG CAPS capsule [id number] Yes Take 2 capsules (0.8 mg total) by mouth daily after supper. Valerio Melanie DASEN, NP  Active             Recommendation:   PCP Follow-up Specialty provider follow-up as scheduled Ongoing Mental Health follow up  Follow Up Plan:   Telephone follow up appointment date/time:  05/26/24  Lenn Ermalinda, LCSW [place]  [facility], [facility] Licensed Clinical Social  Worker  Warehouse Manager: [phone]

## 2024-05-14 NOTE — Patient Instructions (Signed)
 Visit Information  Thank you for taking time to visit with me today. Please don't hesitate to contact me if I can be of assistance to you before our next scheduled appointment.  Your next care management appointment is by telephone on 05/26/24 at 12pm    Please call the care guide team at [phone] if you need to cancel, schedule, or reschedule an appointment.   Please call the Suicide and Crisis Lifeline: 988 call the [place]  National Suicide Prevention Lifeline: 208-341-3635 or TTY: (801) 330-7021 TTY 458-056-5211) to talk to a trained counselor call 1-800-273-TALK (toll free, 24 hour hotline) call 911 if you are experiencing a Mental Health or Behavioral Health Crisis or need someone to talk to.  Caley Ciaramitaro, LCSW [place]  [facility], [facility] Licensed Clinical Social Worker  Direct Dial: [phone]

## 2024-05-21 ENCOUNTER — Ambulatory Visit (INDEPENDENT_AMBULATORY_CARE_PROVIDER_SITE_OTHER): Admitting: Podiatry

## 2024-05-21 ENCOUNTER — Encounter: Payer: Self-pay | Admitting: Podiatry

## 2024-05-21 VITALS — Ht 67.0 in | Wt 117.8 lb

## 2024-05-21 DIAGNOSIS — M79675 Pain in left toe(s): Secondary | ICD-10-CM

## 2024-05-21 DIAGNOSIS — M79674 Pain in right toe(s): Secondary | ICD-10-CM

## 2024-05-21 DIAGNOSIS — B351 Tinea unguium: Secondary | ICD-10-CM | POA: Diagnosis not present

## 2024-05-21 NOTE — Progress Notes (Signed)
   Chief Complaint  Patient presents with   Nail Problem    Pt is here to have left great toenail removed.    SUBJECTIVE Patient presents to office today complaining of elongated, thickened nails that cause pain while ambulating in shoes.  Patient is unable to trim their own nails. Patient is here for further evaluation and treatment.  Past Medical History:  Diagnosis Date   Arthritis    Family history of sudden cardiac death    Glaucoma    Heart failure with mid-range ejection fraction (HCC)    a. 10/2023 Echo: EF 45-50%, no rwma, nl RV size/fxn.   Hypertension    Implantable loop recorder present    a. MDT Linq - placed 09/2017.   NICM (nonischemic cardiomyopathy) (HCC)    a. 2012 Echo: EF 40%; b. 05/2016 Cath: nl cors; c. 05/2016 Echo: EF 50%; d. 09/2017 cMRI [facility]): EF 39%, nl RV size/fxn, no signif valvular dzs. No scar/infiltrative process; e. 12/2020 Echo:  EF 45-50%; f. 10/2023 Echo: EF 45-50%, no rwma, nl RV size/fxn.   Shortened PR interval w/ palpitations    a. 09/2017 EP Study [facility]): no evidence of dual AV nodal physiology/VA conduction/accessory pathway/inducible SVT.  MDT Linq placed.   Situational depression 06/07/2016    No Known Allergies   OBJECTIVE General Patient is awake, alert, and oriented x 3 and in no acute distress. Derm Skin is dry and supple bilateral. Negative open lesions or macerations. Remaining integument unremarkable. Nails are tender, long, thickened and dystrophic with subungual debris, consistent with onychomycosis, 1-5 bilateral. No signs of infection noted. Vasc  DP and PT pedal pulses palpable bilaterally. Temperature gradient within normal limits.  Neuro Epicritic and protective threshold sensation grossly intact bilaterally.  Musculoskeletal Exam No symptomatic pedal deformities noted bilateral. Muscular strength within normal limits.  ASSESSMENT 1.  Pain due to onychomycosis of toenails both 2. H/o total permanent nail avulsion RT hallux.   02/18/2023  PLAN OF CARE 1. Patient evaluated today.  2. Instructed to maintain good pedal hygiene and foot care.  3. Mechanical debridement of nails 1-5 bilaterally performed using a nail nipper. Filed with dremel without incident.  4. Return to clinic PRN   Thresa EMERSON Sar, DPM [facility]  Dr. Thresa EMERSON Sar, DPM    [address] [place], [place] [address]                Office [phone]  Fax [phone]

## 2024-05-26 ENCOUNTER — Other Ambulatory Visit: Payer: Self-pay | Admitting: *Deleted

## 2024-05-26 NOTE — Patient Instructions (Signed)
 Visit Information  Thank you for taking time to visit with me today. Please don't hesitate to contact me if I can be of assistance to you before our next scheduled appointment.  Your next care management appointment is by telephone on 06/22/24 at 1pm  Referral to RN Case Manager.  Please call the care guide team at [phone] if you need to cancel, schedule, or reschedule an appointment.   Please call the Suicide and Crisis Lifeline: 988 call the [place]  National Suicide Prevention Lifeline: 303-875-1381 or TTY: (437)391-2164 TTY 309-363-9758) to talk to a trained counselor call 1-800-273-TALK (toll free, 24 hour hotline) call 911 if you are experiencing a Mental Health or Behavioral Health Crisis or need someone to talk to.  Sherida Dobkins, LCSW [place]  [facility], [facility] Licensed Clinical Social Worker  Direct Dial: [phone]

## 2024-05-26 NOTE — Patient Outreach (Signed)
 Complex Care Management   Visit Note  05/26/2024  Name:  Jesus Price MRN: [id number] DOB: 14-May-1961  Situation: Referral received for Complex Care Management related to Mental/Behavioral Health diagnosis Anxiety I obtained verbal consent from Patient.  Visit completed with Patient  on the phone  Background:   Past Medical History:  Diagnosis Date   Arthritis    Family history of sudden cardiac death    Glaucoma    Heart failure with mid-range ejection fraction (HCC)    a. 10/2023 Echo: EF 45-50%, no rwma, nl RV size/fxn.   Hypertension    Implantable loop recorder present    a. MDT Linq - placed 09/2017.   NICM (nonischemic cardiomyopathy) (HCC)    a. 2012 Echo: EF 40%; b. 05/2016 Cath: nl cors; c. 05/2016 Echo: EF 50%; d. 09/2017 cMRI [facility]): EF 39%, nl RV size/fxn, no signif valvular dzs. No scar/infiltrative process; e. 12/2020 Echo:  EF 45-50%; f. 10/2023 Echo: EF 45-50%, no rwma, nl RV size/fxn.   Shortened PR interval w/ palpitations    a. 09/2017 EP Study [facility]): no evidence of dual AV nodal physiology/VA conduction/accessory pathway/inducible SVT.  MDT Linq placed.   Situational depression 06/07/2016    Assessment: Patient Reported Symptoms:  Cognitive Cognitive Status: Alert and oriented to person, place, and time, Normal speech and language skills, Insightful and able to interpret abstract concepts Cognitive/Intellectual Conditions Management [RPT]: None reported or documented in medical history or problem list   Health Maintenance Behaviors: Annual physical exam Healing Pattern: Average Health Facilitated by: Stress management  Neurological Neurological Review of Symptoms: No symptoms reported    HEENT HEENT Symptoms Reported: No symptoms reported      Cardiovascular Cardiovascular Symptoms Reported: Dizziness Cardiovascular Comment: cardiologist follow up-reports being diagnosed COPD  Respiratory Respiratory Symptoms Reported: Shortness of breath Other  Respiratory Symptoms: Patient reports now having COPD    Endocrine Endocrine Symptoms Reported: No symptoms reported Is patient diabetic?: No    Gastrointestinal Gastrointestinal Symptoms Reported: No symptoms reported      Genitourinary Genitourinary Symptoms Reported: No symptoms reported    Integumentary Integumentary Symptoms Reported: No symptoms reported    Musculoskeletal Musculoskelatal Symptoms Reviewed: Back pain Additional Musculoskeletal Details: back pain unchanged        Psychosocial Psychosocial Symptoms Reported: Anxiety - if selected complete GAD Behavioral Management Strategies: Coping strategies Behavioral Health Comment: Patient reports having appointment with [facility] on 05/31/24 for therapy and medication management Major Change/Loss/Stressor/Fears (CP): Medical condition, self Behaviors When Feeling Stressed/Fearful: medication management ,focus on controllable things, spiritual and gratitude  practices , ongoing mental health counseling Techniques to Cope with Loss/Stress/Change: Counseling, Spiritual practice(s), Diversional activities Quality of Family Relationships: helpful, involved    05/26/2024    PHQ2-9 Depression Screening   Little interest or pleasure in doing things    Feeling down, depressed, or hopeless    PHQ-2 - Total Score    Trouble falling or staying asleep, or sleeping too much    Feeling tired or having little energy    Poor appetite or overeating     Feeling bad about yourself - or that you are a failure or have let yourself or your family down    Trouble concentrating on things, such as reading the newspaper or watching television    Moving or speaking so slowly that other people could have noticed.  Or the opposite - being so fidgety or restless that you have been moving around a lot more than usual  Thoughts that you would be better off dead, or hurting yourself in some way    PHQ2-9 Total Score    If you  checked off any problems, how difficult have these problems made it for you to do your work, take care of things at home, or get along with other people    Depression Interventions/Treatment      There were no vitals filed for this visit.    Medications Reviewed Today     Reviewed by Ermalinda Lenn HERO, LCSW (Social Worker) on 05/26/24 at 1208  Med List Status: <None>   Medication Order Taking? Sig Documenting Provider Last Dose Status Informant  aspirin  EC 81 MG tablet [id number] Yes Take 1 tablet (81 mg total) by mouth daily. Swallow whole. Cannady, Jolene T, NP  Active   CARDIZEM  CD 180 MG 24 hr capsule [id number] Yes Take 1 capsule (180 mg total) by mouth daily. Vivienne Lonni Ingle, NP  Active   carvedilol  (COREG ) 6.25 MG tablet [id number] Yes Take 1 tablet (6.25 mg total) by mouth 2 (two) times daily. Darliss Rogue, MD  Active   COSOPT 2-0.5 % ophthalmic solution [id number] Yes 1 drop 2 (two) times daily. [provider]  Active   fluticasone (FLONASE) 50 MCG/ACT nasal spray [id number] Yes Place 1 spray into both nostrils daily. [provider]  Active   hydrOXYzine  (VISTARIL ) 25 MG capsule [id number] Yes Take 1 capsule (25 mg total) by mouth every 8 (eight) hours as needed for anxiety. Cannady, Jolene T, NP  Active   ibuprofen  (ADVIL ) 600 MG tablet [id number] Yes Take 1 tablet (600 mg total) by mouth every 6 (six) hours as needed. Corlis Burnard DEL, NP  Active   latanoprost (XALATAN) 0.005 % ophthalmic solution [id number] Yes Place 1 drop into both eyes at bedtime. [provider]  Active   meloxicam  (MOBIC ) 15 MG tablet [id number] Yes Take 1 tablet (15 mg total) by mouth daily. Cannady, Jolene T, NP  Active   methocarbamol  (ROBAXIN ) 500 MG tablet [id number] Yes Take 1 tablet (500 mg total) by mouth every 6 (six) hours as needed for muscle spasms. Cannady, Jolene T, NP  Active   mirtazapine  (REMERON ) 7.5 MG tablet [id number] Yes Take 1 tablet (7.5 mg total) by mouth  at bedtime. Cannady, Jolene T, NP  Active   rosuvastatin  (CRESTOR ) 10 MG tablet [id number] Yes Take 1 tablet (10 mg total) by mouth daily. Darliss Rogue, MD  Active   sacubitril -valsartan  (ENTRESTO ) 49-51 MG [id number] Yes Take 1 tablet by mouth 2 (two) times daily. Riddle, Suzann, NP  Active   sildenafil  (VIAGRA ) 100 MG tablet [id number] Yes TAKE ONE TABLET BY MOUTH ONE TIME DAILY 1 HOUR PRIOR TO INTERCOURSE Stoioff, Glendia BROCKS, MD  Active   tamsulosin  (FLOMAX ) 0.4 MG CAPS capsule [id number] Yes Take 2 capsules (0.8 mg total) by mouth daily after supper. Valerio Melanie DASEN, NP  Active             Recommendation:   PCP Follow-up Specialty provider follow-up as scheduled Follow up with RNCM  Follow Up Plan:   Telephone follow up appointment date/time:  06/22/24 1pm  Aryanne Gilleland, LCSW [place]  [facility] Health Licensed Clinical Social Worker  Direct Dial: [phone]

## 2024-05-30 ENCOUNTER — Ambulatory Visit

## 2024-06-02 ENCOUNTER — Ambulatory Visit: Attending: Cardiology

## 2024-06-02 DIAGNOSIS — I428 Other cardiomyopathies: Secondary | ICD-10-CM | POA: Diagnosis not present

## 2024-06-03 LAB — CUP PACEART REMOTE DEVICE CHECK
Date Time Interrogation Session: 20251202231122
Implantable Pulse Generator Implant Date: 20221228

## 2024-06-06 ENCOUNTER — Ambulatory Visit: Payer: Self-pay | Admitting: Cardiology

## 2024-06-09 ENCOUNTER — Encounter: Payer: Self-pay | Admitting: Urology

## 2024-06-09 NOTE — Progress Notes (Signed)
 Remote Loop Recorder Transmission

## 2024-06-15 ENCOUNTER — Telehealth

## 2024-06-22 ENCOUNTER — Other Ambulatory Visit: Payer: Self-pay | Admitting: *Deleted

## 2024-06-22 NOTE — Patient Instructions (Signed)
 Visit Information  Thank you for taking time to visit with me today. Please don't hesitate to contact me if I can be of assistance to you before our next scheduled appointment.  Your next care management appointment is by telephone on 07/12/24 at 1:30pm    Please call the care guide team at [phone] if you need to cancel, schedule, or reschedule an appointment.   Please call the Suicide and Crisis Lifeline: 988 call the [place]  National Suicide Prevention Lifeline: 2255943328 or TTY: (551)744-6093 TTY 469-733-6014) to talk to a trained counselor call 1-800-273-TALK (toll free, 24 hour hotline) call 911 if you are experiencing a Mental Health or Behavioral Health Crisis or need someone to talk to.  Tasha Jindra, LCSW [place]  [facility], [facility] Health Licensed Clinical Social Worker  Direct Dial: [phone]

## 2024-06-22 NOTE — Patient Outreach (Signed)
 Complex Care Management   Visit Note  06/22/2024  Name:  Jesus Price MRN: [id number] DOB: 10/27/1960  Situation: Referral received for Mental/Behavioral Health diagnosis depressionComplex Care Management related to  I obtained verbal consent from Patient.  Visit completed with Patient  on the phone  Background:   Past Medical History:  Diagnosis Date   Arthritis    Family history of sudden cardiac death    Glaucoma    Heart failure with mid-range ejection fraction (HCC)    a. 10/2023 Echo: EF 45-50%, no rwma, nl RV size/fxn.   Hypertension    Implantable loop recorder present    a. MDT Linq - placed 09/2017.   NICM (nonischemic cardiomyopathy) (HCC)    a. 2012 Echo: EF 40%; b. 05/2016 Cath: nl cors; c. 05/2016 Echo: EF 50%; d. 09/2017 cMRI [facility]): EF 39%, nl RV size/fxn, no signif valvular dzs. No scar/infiltrative process; e. 12/2020 Echo:  EF 45-50%; f. 10/2023 Echo: EF 45-50%, no rwma, nl RV size/fxn.   Shortened PR interval w/ palpitations    a. 09/2017 EP Study [facility]): no evidence of dual AV nodal physiology/VA conduction/accessory pathway/inducible SVT.  MDT Linq placed.   Situational depression 06/07/2016    Assessment: Patient Reported Symptoms:  Cognitive Cognitive Status: Alert and oriented to person, place, and time, Normal speech and language skills, Insightful and able to interpret abstract concepts      Neurological Neurological Review of Symptoms: No symptoms reported    HEENT HEENT Symptoms Reported: No symptoms reported      Cardiovascular Cardiovascular Symptoms Reported: Dizziness    Respiratory Respiratory Symptoms Reported: Shortness of breath Additional Respiratory Details: Has appointment with provider to discuss    Endocrine Endocrine Symptoms Reported: No symptoms reported Is patient diabetic?: No    Gastrointestinal Gastrointestinal Symptoms Reported: No symptoms reported      Genitourinary Genitourinary Symptoms Reported: No symptoms  reported    Integumentary      Musculoskeletal Musculoskelatal Symptoms Reviewed: Back pain, Other Other Musculoskeletal Symptoms: Back, ankle and hand 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 leaving things in the past Techniques to Cope with Loss/Stress/Change: Diversional activities Quality of Family Relationships: supportive Do you feel physically threatened by others?: No    06/22/2024    PHQ2-9 Depression Screening   Little interest or pleasure in doing things    Feeling down, depressed, or hopeless    PHQ-2 - Total Score    Trouble falling or staying asleep, or sleeping too much    Feeling tired or having little energy    Poor appetite or overeating     Feeling bad about yourself - or that you are a failure or have let yourself or your family down    Trouble concentrating on things, such as reading the newspaper or watching television    Moving or speaking so slowly that other people could have noticed.  Or the opposite - being so fidgety or restless that you have been moving around a lot more than usual    Thoughts that you would be better off dead, or hurting yourself in some way    PHQ2-9 Total Score    If you checked off any problems, how difficult have these problems made  it for you to do your work, take care of things at home, or get along with other people    Depression Interventions/Treatment      There were no vitals filed for this visit.    Medications Reviewed Today     Reviewed by Ermalinda Lenn HERO, LCSW (Social Worker) on 06/22/24 at 1114  Med List Status: <None>   Medication Order Taking? Sig Documenting Provider Last Dose Status Informant   aspirin  EC 81 MG tablet [id number] Yes Take 1 tablet (81 mg total) by mouth daily. Swallow whole. Cannady, Jolene T, NP  Active   CARDIZEM  CD 180 MG 24 hr capsule [id number] Yes Take 1 capsule (180 mg total) by mouth daily. Vivienne Lonni Ingle, NP  Active   carvedilol  (COREG ) 6.25 MG tablet [id number] Yes Take 1 tablet (6.25 mg total) by mouth 2 (two) times daily. Darliss Rogue, MD  Active   COSOPT 2-0.5 % ophthalmic solution [id number] Yes 1 drop 2 (two) times daily. [provider]  Active   fluticasone (FLONASE) 50 MCG/ACT nasal spray [id number] Yes Place 1 spray into both nostrils daily. [provider]  Active   hydrOXYzine  (VISTARIL ) 25 MG capsule [id number] Yes Take 1 capsule (25 mg total) by mouth every 8 (eight) hours as needed for anxiety. Cannady, Jolene T, NP  Active   ibuprofen  (ADVIL ) 600 MG tablet [id number] Yes Take 1 tablet (600 mg total) by mouth every 6 (six) hours as needed. Corlis Burnard DEL, NP  Active   latanoprost (XALATAN) 0.005 % ophthalmic solution [id number] Yes Place 1 drop into both eyes at bedtime. [provider]  Active   meloxicam  (MOBIC ) 15 MG tablet [id number] Yes Take 1 tablet (15 mg total) by mouth daily. Cannady, Jolene T, NP  Active   methocarbamol  (ROBAXIN ) 500 MG tablet [id number] Yes Take 1 tablet (500 mg total) by mouth every 6 (six) hours as needed for muscle spasms. Cannady, Jolene T, NP  Active   mirtazapine  (REMERON ) 7.5 MG tablet [id number] Yes Take 1 tablet (7.5 mg total) by mouth at bedtime. Cannady, Jolene T, NP  Active   rosuvastatin  (CRESTOR ) 10 MG tablet [id number] Yes Take 1 tablet (10 mg total) by mouth daily. Darliss Rogue, MD  Active   sacubitril -valsartan  (ENTRESTO ) 49-51 MG [id number] Yes Take 1 tablet by mouth 2 (two) times daily. Riddle, Suzann, NP  Active   sildenafil  (VIAGRA ) 100 MG tablet [id number] Yes TAKE ONE TABLET BY MOUTH ONE TIME DAILY 1 HOUR PRIOR TO INTERCOURSE Stoioff, Glendia BROCKS, MD  Active   tamsulosin   (FLOMAX ) 0.4 MG CAPS capsule [id number] Yes Take 2 capsules (0.8 mg total) by mouth daily after supper. Valerio Melanie DASEN, NP  Active             Recommendation:   PCP Follow-up Specialty provider follow-up as scheduled Ongoing mental health follow up  Follow Up Plan:   Telephone follow up appointment date/time:  07/12/24 1:30pm  Ammaar Encina Ermalinda HUGHS [place]  [facility], [facility] Health Licensed Clinical Social Worker  Direct Dial: [phone]

## 2024-06-30 ENCOUNTER — Ambulatory Visit

## 2024-07-03 ENCOUNTER — Ambulatory Visit: Attending: Cardiology

## 2024-07-03 DIAGNOSIS — R002 Palpitations: Secondary | ICD-10-CM

## 2024-07-05 LAB — CUP PACEART REMOTE DEVICE CHECK
Date Time Interrogation Session: 20260102231140
Implantable Pulse Generator Implant Date: 20221228

## 2024-07-08 ENCOUNTER — Ambulatory Visit: Admitting: Nurse Practitioner

## 2024-07-08 NOTE — Progress Notes (Signed)
 Remote Loop Recorder Transmission

## 2024-07-10 ENCOUNTER — Ambulatory Visit: Payer: Self-pay | Admitting: Cardiology

## 2024-07-12 ENCOUNTER — Other Ambulatory Visit: Payer: Self-pay | Admitting: *Deleted

## 2024-07-12 DIAGNOSIS — J441 Chronic obstructive pulmonary disease with (acute) exacerbation: Secondary | ICD-10-CM

## 2024-07-13 NOTE — Patient Outreach (Signed)
 Complex Care Management   Visit Note  07/13/2024  Name:  Jesus Price MRN: [id number] DOB: 05/01/1961  Situation: Referral received for Complex Care Management related to Mental/Behavioral Health diagnosis depression I obtained verbal consent from Patient.  Visit completed with Patient  on the phone  Background:   Past Medical History:  Diagnosis Date   Arthritis    Family history of sudden cardiac death    Glaucoma    Heart failure with mid-range ejection fraction (HCC)    a. 10/2023 Echo: EF 45-50%, no rwma, nl RV size/fxn.   Hypertension    Implantable loop recorder present    a. MDT Linq - placed 09/2017.   NICM (nonischemic cardiomyopathy) (HCC)    a. 2012 Echo: EF 40%; b. 05/2016 Cath: nl cors; c. 05/2016 Echo: EF 50%; d. 09/2017 cMRI [facility]): EF 39%, nl RV size/fxn, no signif valvular dzs. No scar/infiltrative process; e. 12/2020 Echo:  EF 45-50%; f. 10/2023 Echo: EF 45-50%, no rwma, nl RV size/fxn.   Shortened PR interval w/ palpitations    a. 09/2017 EP Study [facility]): no evidence of dual AV nodal physiology/VA conduction/accessory pathway/inducible SVT.  MDT Linq placed.   Situational depression 06/07/2016    Assessment: Patient Reported Symptoms:  Cognitive Cognitive Status: Alert and oriented to person, place, and time, Normal speech and language skills, Insightful and able to interpret abstract concepts Cognitive/Intellectual Conditions Management [RPT]: None reported or documented in medical history or problem list   Health Maintenance Behaviors: Annual physical exam, Spiritual practice(s) Healing Pattern: Average Health Facilitated by: Prayer/meditation, Rest  Neurological Neurological Review of Symptoms: No symptoms reported    HEENT HEENT Symptoms Reported: No symptoms reported      Cardiovascular Cardiovascular Symptoms Reported: Dizziness (in the mornings when he gets up-sits up for a bit and then gets up out of bed) Does patient have uncontrolled  Hypertension?: No Cardiovascular Management Strategies: Activity, Medication therapy, Routine screening Cardiovascular Self-Management Outcome: 4 (good) Cardiovascular Comment: active with cardiologist-COPD will go back 07/29/24  Respiratory Respiratory Symptoms Reported: Shortness of breath Additional Respiratory Details: has appointment with cardilologist on 07/29/24 Respiratory Management Strategies: Adequate rest, Medication therapy, Routine screening Respiratory Self-Management Outcome: 3 (uncertain)  Endocrine Endocrine Symptoms Reported: No symptoms reported Is patient diabetic?: No    Gastrointestinal Gastrointestinal Symptoms Reported: No symptoms reported      Genitourinary Genitourinary Symptoms Reported: No symptoms reported    Integumentary Integumentary Symptoms Reported: No symptoms reported    Musculoskeletal Musculoskelatal Symptoms Reviewed: Back pain Other Musculoskeletal Symptoms: back, annkle and hand pains 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 Cleopatra next 07/23/24 Behavioral Management Strategies: Coping strategies Behavioral Health Comment: Patient remains active [facility] Major Change/Loss/Stressor/Fears (CP): Medical condition, self Behaviors When Feeling Stressed/Fearful: focus on remaining positive, setting clear boundaries Techniques to Cope with Loss/Stress/Change: Diversional activities Quality of Family Relationships: supportive Do you feel physically threatened by others?: No    07/13/2024    PHQ2-9 Depression Screening   Little interest or pleasure in doing things Several days  Feeling  down, depressed, or hopeless Several days  PHQ-2 - Total Score 2  Trouble falling or staying asleep, or sleeping too much Several days  Feeling tired or having little energy Several days  Poor appetite or  overeating  Not at all  Feeling bad about yourself - or that you are a failure or have let yourself or your family down Not at all  Trouble concentrating on things, such as reading the newspaper or watching television Several days  Moving or speaking so slowly that other people could have noticed.  Or the opposite - being so fidgety or restless that you have been moving around a lot more than usual Not at all  Thoughts that you would be better off dead, or hurting yourself in some way Not at all  PHQ2-9 Total Score 5  If you checked off any problems, how difficult have these problems made it for you to do your work, take care of things at home, or get along with other people Somewhat difficult  Depression Interventions/Treatment Referral to Psychiatry    There were no vitals filed for this visit. Pain Scale: 0-10 Pain Score: 0-No pain  Medications Reviewed Today     Reviewed by Ermalinda Lenn HERO, LCSW (Social Worker) on 07/12/24 at 1206  Med List Status: <None>   Medication Order Taking? Sig Documenting Provider Last Dose Status Informant  aspirin  EC 81 MG tablet [id number] Yes Take 1 tablet (81 mg total) by mouth daily. Swallow whole. Cannady, Jolene T, NP  Active   CARDIZEM  CD 180 MG 24 hr capsule [id number] Yes Take 1 capsule (180 mg total) by mouth daily. Vivienne Lonni Ingle, NP  Active   carvedilol  (COREG ) 6.25 MG tablet [id number] Yes Take 1 tablet (6.25 mg total) by mouth 2 (two) times daily. Darliss Rogue, MD  Active   COSOPT 2-0.5 % ophthalmic solution [id number] Yes 1 drop 2 (two) times daily. [provider]  Active   fluticasone (FLONASE) 50 MCG/ACT nasal spray [id number] Yes Place 1 spray into both nostrils daily. [provider]  Active   hydrOXYzine   (VISTARIL ) 25 MG capsule [id number] Yes Take 1 capsule (25 mg total) by mouth every 8 (eight) hours as needed for anxiety. Cannady, Jolene T, NP  Active   ibuprofen  (ADVIL ) 600 MG tablet [id number] Yes Take 1 tablet (600 mg total) by mouth every 6 (six) hours as needed. Corlis Burnard DEL, NP  Active   latanoprost (XALATAN) 0.005 % ophthalmic solution [id number] Yes Place 1 drop into both eyes at bedtime. [provider]  Active   meloxicam  (MOBIC ) 15 MG tablet [id number] Yes Take 1 tablet (15 mg total) by mouth daily. Cannady, Jolene T, NP  Active   methocarbamol  (ROBAXIN ) 500 MG tablet [id number] Yes Take 1 tablet (500 mg total) by mouth every 6 (six) hours as needed for muscle spasms. Cannady, Jolene T, NP  Active   mirtazapine  (REMERON ) 7.5 MG tablet [id number] Yes Take 1 tablet (7.5 mg total) by mouth at bedtime. Cannady, Jolene T, NP  Active   rosuvastatin  (CRESTOR ) 10 MG tablet [id number] Yes Take 1 tablet (10 mg total) by mouth daily. Darliss Rogue, MD  Active   sacubitril -valsartan  (ENTRESTO ) 49-51 MG [id number] Yes Take 1 tablet by mouth 2 (two) times daily. Riddle, Suzann, NP  Active   sildenafil  (VIAGRA ) 100 MG tablet [id number] Yes TAKE ONE TABLET BY MOUTH ONE TIME DAILY 1 HOUR PRIOR TO INTERCOURSE Stoioff, Glendia BROCKS, MD  Active   tamsulosin  (FLOMAX ) 0.4 MG CAPS capsule [id number] Yes Take 2 capsules (0.8 mg total) by mouth daily after supper. Cannady, Jolene T, NP  Active             Recommendation:   PCP Follow-up Specialty provider  follow-up as scheduled Follow up with [facility] for disease management Follow up with Psychiatrist for ongoing mental health follow up 07/23/24  Follow Up Plan:   Patient has met all care management goals. Care Management case will be closed. Patient has been provided contact information should new needs arise.    Mehar Sagen, LCSW [place]  [facility], [facility] Health Licensed Clinical Social Worker  Direct Dial: [phone]

## 2024-07-13 NOTE — Patient Instructions (Signed)
 Visit Information  Thank you for taking time to visit with me today. Please don't hesitate to contact me if I can be of assistance to you before our next scheduled appointment.  Your next care management appointment is no further scheduled appointments.    Referral to RN Case Manager.  Please call the care guide team at [phone] if you need to cancel, schedule, or reschedule an appointment.   Please call the Suicide and Crisis Lifeline: 988 call the [place]  National Suicide Prevention Lifeline: 564 709 5388 or TTY: 6718083723 TTY 613-451-9868) to talk to a trained counselor call 1-800-273-TALK (toll free, 24 hour hotline) call 911 if you are experiencing a Mental Health or Behavioral Health Crisis or need someone to talk to.  Baldomero Mirarchi, LCSW [place]  [facility], [facility] Health Licensed Clinical Social Worker  Direct Dial: [phone]

## 2024-07-15 ENCOUNTER — Telehealth: Payer: Self-pay

## 2024-07-15 NOTE — Progress Notes (Signed)
 Complex Care Management Note  Care Guide Note 07/15/2024 Name: Jesus Price MRN: [id number] DOB: [birth year]  Jesus Price is a 64 y.o. year old male who sees Valerio Moris T, NP for primary care. I reached out to Jesus Price by phone today to offer complex care management services.  Jesus Price was given information about Complex Care Management services today including:   The Complex Care Management services include support from the care team which includes your Nurse Care Manager, Clinical Social Worker, or Pharmacist.  The Complex Care Management team is here to help remove barriers to the health concerns and goals most important to you. Complex Care Management services are voluntary, and the patient may decline or stop services at any time by request to their care team member.   Complex Care Management Consent Status: Patient agreed to services and verbal consent obtained.   Follow up plan:  Telephone appointment with complex care management team member scheduled for:  07/16/2024  Encounter Outcome:  Patient Scheduled  Jesus Price , RMA     [place]  [facility] Guide  Direct Dial: [phone]  Website: [email or web address]

## 2024-07-16 ENCOUNTER — Other Ambulatory Visit: Payer: Self-pay

## 2024-07-16 NOTE — Patient Instructions (Signed)
 Lamar Morton Dollar - I am sorry I was unable to reach you today for our scheduled appointment. I work with Valerio Melanie DASEN, NP and am calling to support your healthcare needs. Please contact me at [phone] at your earliest convenience. I look forward to speaking with you soon.   Thank you,  Denard Tuminello  Administrator, [employer] [phone]

## 2024-07-21 ENCOUNTER — Ambulatory Visit: Payer: Self-pay | Admitting: Nurse Practitioner

## 2024-07-21 MED ORDER — SACUBITRIL-VALSARTAN 49-51 MG PO TABS: 1.0000 | ORAL_TABLET | Freq: Two times a day (BID) | ORAL | 11 refills | Status: DC

## 2024-07-21 NOTE — Telephone Encounter (Signed)
 FYI Only or Action Required?: Action required by provider: medication refill request.  Patient was last seen in primary care on 04/12/2024 by Cannady, Jolene T, NP.  Called Nurse Triage reporting Medication Refill.  Symptoms began several days ago.  Interventions attempted: Nothing.  Symptoms are: stable.Pt. Out of Enstresto. States pharmacy said he has to pay out of pocket $85 now.States his PCP helped him get a grant to pay for the medication. Asking foe help. Please advise pt.  Triage Disposition: Call PCP Now  Patient/caregiver understands and will follow disposition?: Yes      eason for Triage: Head is starting to hurt       Needs sacubitril -valsartan  (ENTRESTO ) 49-51 MG refilled. Request already submitted.    Reason for Disposition  [1] Prescription refill request for ESSENTIAL medicine (i.e., likelihood of harm to patient if not taken) AND [2] triager unable to refill per department policy  Protocols used: Medication Refill and Renewal Call-A-AH

## 2024-07-21 NOTE — Telephone Encounter (Signed)
 Copied from CRM #[id number]. Topic: Clinical - Medication Refill >> Jul 21, 2024  8:49 AM Wess RAMAN wrote: Medication: sacubitril -valsartan  (ENTRESTO ) 49-51 MG   Has the patient contacted their pharmacy? Yes (Agent: If no, request that the patient contact the pharmacy for the refill. If patient does not wish to contact the pharmacy document the reason why and proceed with request.) (Agent: If yes, when and what did the pharmacy advise?) Stated he needs to pay a deductible but he states he has a grant  This is the patient's preferred pharmacy:   [facility] [address], [place] [facility] [address] [place] [address] Phone: [phone] Fax: [phone]  Is this the correct pharmacy for this prescription? Yes If no, delete pharmacy and type the correct one.   Has the prescription been filled recently? Yes  Is the patient out of the medication? Yes  Has the patient been seen for an appointment in the last year OR does the patient have an upcoming appointment? Yes  Can we respond through MyChart? Yes  Agent: Please be advised that Rx refills may take up to 3 business days. We ask that you follow-up with your pharmacy.

## 2024-07-21 NOTE — Telephone Encounter (Signed)
 Requested medication (s) are due for refill today: yes  Requested medication (s) are on the active medication list: yes  Last refill:  12/31/23  Future visit scheduled: yes  Notes to clinic:  Unable to refill per protocol, last refill by another provider.      Requested Prescriptions  Pending Prescriptions Disp Refills   sacubitril -valsartan  (ENTRESTO ) 49-51 MG 60 tablet 11    Sig: Take 1 tablet by mouth 2 (two) times daily.     Off-Protocol Failed - 07/21/2024  1:16 PM      Failed - Medication not assigned to a protocol, review manually.      Passed - Valid encounter within last 12 months    Recent Outpatient Visits           3 months ago Moderate episode of recurrent major depressive disorder (HCC)   [facility] [place], [place] T, NP   4 months ago NICM (nonischemic cardiomyopathy) (HCC)   [facility] [place], Melanie DASEN, NP   8 months ago Essential hypertension   [facility] [place], [place] T, NP   9 months ago Moderate episode of recurrent major depressive disorder (HCC)   [facility] [place], Melanie T, NP   11 months ago NICM (nonischemic cardiomyopathy) (HCC)   [facility] [place], Melanie DASEN, NP       Future Appointments             In 1 week Vivienne, Lonni Ingle, NP  [facility] [place]   In 1 month Stoioff, Glendia BROCKS, MD [facility]

## 2024-07-21 NOTE — Telephone Encounter (Signed)
 Sent in

## 2024-07-27 ENCOUNTER — Telehealth: Payer: Self-pay | Admitting: Nurse Practitioner

## 2024-07-27 DIAGNOSIS — I10 Essential (primary) hypertension: Secondary | ICD-10-CM

## 2024-07-27 DIAGNOSIS — E782 Mixed hyperlipidemia: Secondary | ICD-10-CM

## 2024-07-27 DIAGNOSIS — I5020 Unspecified systolic (congestive) heart failure: Secondary | ICD-10-CM

## 2024-07-27 DIAGNOSIS — I456 Pre-excitation syndrome: Secondary | ICD-10-CM

## 2024-07-27 DIAGNOSIS — I428 Other cardiomyopathies: Secondary | ICD-10-CM

## 2024-07-27 NOTE — Telephone Encounter (Signed)
Called and notified patient of Jolene's message. Patient verbalized understanding.

## 2024-07-27 NOTE — Telephone Encounter (Signed)
 Copied from CRM #[id number]. Topic: Clinical - Prescription Issue >> Jul 27, 2024 11:44 AM Jesus Price wrote: Reason for CRM: Pt stated medication is too expensive sacubitril -valsartan  (ENTRESTO ) 49-51 MG. [facility] [address], [place] - [address] [place] [address] Phone: [phone] Fax: [phone]

## 2024-07-27 NOTE — Addendum Note (Signed)
 Addended by: Kiylah Loyer T on: 07/27/2024 04:00 PM   Modules accepted: Orders

## 2024-07-29 ENCOUNTER — Encounter: Payer: Self-pay | Admitting: Nurse Practitioner

## 2024-07-29 ENCOUNTER — Ambulatory Visit: Attending: Nurse Practitioner | Admitting: Nurse Practitioner

## 2024-07-29 ENCOUNTER — Ambulatory Visit: Admitting: Nurse Practitioner

## 2024-07-29 VITALS — BP 130/70 | HR 48 | Ht 67.0 in | Wt 116.5 lb

## 2024-07-29 DIAGNOSIS — R002 Palpitations: Secondary | ICD-10-CM

## 2024-07-29 DIAGNOSIS — Z720 Tobacco use: Secondary | ICD-10-CM

## 2024-07-29 DIAGNOSIS — R0789 Other chest pain: Secondary | ICD-10-CM

## 2024-07-29 DIAGNOSIS — I493 Ventricular premature depolarization: Secondary | ICD-10-CM

## 2024-07-29 DIAGNOSIS — I5020 Unspecified systolic (congestive) heart failure: Secondary | ICD-10-CM | POA: Diagnosis not present

## 2024-07-29 DIAGNOSIS — I10 Essential (primary) hypertension: Secondary | ICD-10-CM | POA: Diagnosis not present

## 2024-07-29 DIAGNOSIS — I428 Other cardiomyopathies: Secondary | ICD-10-CM

## 2024-07-29 MED ORDER — LOSARTAN POTASSIUM 50 MG PO TABS: 50.0000 mg | ORAL_TABLET | Freq: Every day | ORAL | 1 refills | Status: AC

## 2024-07-29 MED ORDER — DILTIAZEM HCL ER COATED BEADS 120 MG PO CP24: 120.0000 mg | ORAL_CAPSULE | Freq: Every day | ORAL | 1 refills | Status: DC

## 2024-07-29 MED ORDER — DILTIAZEM HCL ER COATED BEADS 120 MG PO CP24: 120.0000 mg | ORAL_CAPSULE | Freq: Every day | ORAL | 1 refills | Status: AC

## 2024-07-29 NOTE — Progress Notes (Signed)
 "    Office Visit    Patient Name: Jesus Price Date of Encounter: 07/29/2024  Primary Care Provider:  Valerio Melanie DASEN, NP Primary Cardiologist:  Redell Cave, MD  Electrophysiologist:  Fonda Kitty, MD   Chief Complaint    64 y.o. 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   Past Medical History   Subjective   Past Medical History:  Diagnosis Date   Arthritis    Atypical chest pain    Family history of sudden cardiac death    Glaucoma    Heart failure with mid-range ejection fraction (HCC)    a. 10/2023 Echo: EF 45-50%, no rwma, nl RV size/fxn.   Hypertension    Implantable loop recorder present    a. MDT Linq - placed 09/2017.   NICM (nonischemic cardiomyopathy) (HCC)    a. 2012 Echo: EF 40%; b. 05/2016 Cath: nl cors; c. 05/2016 Echo: EF 50%; d. 09/2017 c[facility]): EF 39%, nl RV size/fxn, no signif valvular dzs. No scar/infiltrative process; e. 12/2020 Echo:  EF 45-50%; f. 10/2023 Echo: EF 45-50%, no rwma, nl RV size/fxn.   Shortened PR interval w/ palpitations    a. 09/2017 EP Study [facility]): no evidence of dual AV nodal physiology/VA conduction/accessory pathway/inducible SVT.  MDT Linq placed.   Situational depression 06/07/2016   Tobacco abuse    Past Surgical History:  Procedure Laterality Date   EYE SURGERY      Allergies  Allergies[1]     History of Present Illness      64 y.o. 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  He was previously followed at [facility] and subsequently at [facility].  He was initially diagnosed with nonischemic cardiomyopathy in 2012, at which time EF was reportedly 40%.   In the setting of ongoing cardiomyopathy, he underwent catheterization in 2017 which reportedly showed normal coronary arteries.  In the setting of short PR and palpitations with concern for accessory pathway, he underwent EP study at [facility] in April 2019 which showed no evidence of dual AV nodal physiology, VA conduction, accessory pathway, or inducible SVT.  A Medtronic Linq was subsequently placed.  [name] established care with Dr. Cindie in June 2022.  Echo in July 2022 showed an EF of 45 to 50% with global hypokinesis.  More recent follow-up echo in May 2025 showed persistent LV dysfunction with an EF of 45 to 50%, normal RV size and function, and no significant valvular disease.       [name] notes that he has been feeling more tired over the past few months and also experiences orthostatic lightheadedness periodically.  He has chronic, intermittent, sharp and somewhat fleeting chest pain that has been occurring for > 10 years, 2-3x/wk, almost exclusively at rest and/or w/ emotional upset, w/o assoc Ss, lasting < 30 secs, and resolving spontaneously.  Discomfort is more likely to occur when he is lying on his left side.  Overall, the occurrence of this pain is stable.  He does not routinely exercise but is capable of carrying out routine activities in and around his home and/or walking long distances when shopping, w/o chest pain or dyspnea.  He continues to smoke 3 cigarettes a day.  Upon reviewing his  medications, he notes that until recently, he was taking both Entresto  and losartan .  He ran out of Entresto  a few days ago and does not think he will be able to afford it going forward and is now interested in just being on losartan .  Losartan  was previously discontinued according to our paperwork at the time of Entresto  initiation however, patient says that he had continued to take it.  He notes occasional brief palpitations which are chronic in nature.  He denies PND, orthopnea, syncope, edema, or  early satiety. Objective   Home Medications    Current Outpatient Medications  Medication Sig Dispense Refill   aspirin  EC 81 MG tablet Take 1 tablet (81 mg total) by mouth daily. Swallow whole.     carvedilol  (COREG ) 6.25 MG tablet Take 1 tablet (6.25 mg total) by mouth 2 (two) times daily. 180 tablet 3   COSOPT 2-0.5 % ophthalmic solution 1 drop 2 (two) times daily.     fluticasone (FLONASE) 50 MCG/ACT nasal spray Place 1 spray into both nostrils daily.     hydrOXYzine  (VISTARIL ) 25 MG capsule Take 1 capsule (25 mg total) by mouth every 8 (eight) hours as needed for anxiety. 30 capsule 0   ibuprofen  (ADVIL ) 600 MG tablet Take 1 tablet (600 mg total) by mouth every 6 (six) hours as needed. 30 tablet 0   latanoprost (XALATAN) 0.005 % ophthalmic solution Place 1 drop into both eyes at bedtime.     meloxicam  (MOBIC ) 15 MG tablet Take 1 tablet (15 mg total) by mouth daily. 30 tablet 1   methocarbamol  (ROBAXIN ) 500 MG tablet Take 1 tablet (500 mg total) by mouth every 6 (six) hours as needed for muscle spasms. 120 tablet 4   mirtazapine  (REMERON ) 7.5 MG tablet Take 1 tablet (7.5 mg total) by mouth at bedtime. 90 tablet 1   rosuvastatin  (CRESTOR ) 10 MG tablet Take 1 tablet (10 mg total) by mouth daily. 90 tablet 3   sildenafil  (VIAGRA ) 100 MG tablet TAKE ONE TABLET BY MOUTH ONE TIME DAILY 1 HOUR PRIOR TO INTERCOURSE 30 tablet 3   tamsulosin  (FLOMAX ) 0.4 MG CAPS capsule Take 2 capsules (0.8 mg total) by mouth daily after supper. 180 capsule 3   diltiazem  (CARDIZEM  CD) 120 MG 24 hr capsule Take 1 capsule (120 mg total) by mouth daily. 90 capsule 1   losartan  (COZAAR ) 50 MG tablet Take 1 tablet (50 mg total) by mouth daily. 90 tablet 1   No current facility-administered medications for this visit.     Physical Exam    VS:  BP 130/70 (BP Location: Left Arm, Patient Position: Sitting, Cuff Size: Normal)   Pulse (!) 48   Ht 5' 7 (1.702 m)   Wt 116 lb 8 oz (52.8 kg)   SpO2 99%   BMI 18.25 kg/m   , BMI Body mass index is 18.25 kg/m.          GEN: Well nourished, well developed, in no acute distress. HEENT: normal. Neck: Supple, no JVD, carotid bruits, or masses. Cardiac: RRR, no murmurs, rubs, or gallops. No clubbing, cyanosis, edema.  Radials 2+/PT 2+ and equal bilaterally.  Respiratory:  Respirations regular and unlabored, clear to auscultation bilaterally. GI: Soft, nontender, nondistended, BS + x 4. MS: no deformity or atrophy. Skin: warm and dry, no rash. Neuro:  Strength and sensation are intact. Psych: Normal affect.  Accessory Clinical Findings    ECG personally reviewed by me today - EKG Interpretation Date/Time:  Thursday July 29 2024 15:53:20 EST Ventricular Rate:  48 PR Interval:  136 QRS Duration:  90 QT Interval:  464 QTC Calculation: 414 R Axis:   43  Text Interpretation: Sinus bradycardia Minimal voltage criteria for LVH, may be normal variant ( Sokolow-Lyon ) Septal infarct , age undetermined Nonspecific T wave abnormality now evident in Inferior leads Confirmed by Vivienne Bruckner [phone]) on 07/29/2024 3:56:12 PM  - no acute changes.  Lab Results  Component Value Date   WBC 4.8 03/15/2024   HGB 12.4 (L) 03/15/2024   HCT 36.7 (L) 03/15/2024   MCV 96 03/15/2024   PLT 180 03/15/2024   Lab Results  Component Value Date   CREATININE 0.86 03/15/2024   BUN 20 03/15/2024   NA 140 03/15/2024   K 3.6 03/15/2024   CL 102 03/15/2024   CO2 21 03/15/2024   Lab Results  Component Value Date   ALT 11 03/15/2024   AST 14 03/15/2024   ALKPHOS 70 03/15/2024   BILITOT 0.3 03/15/2024   Lab Results  Component Value Date   CHOL 126 03/15/2024   HDL 58 03/15/2024   LDLCALC 56 03/15/2024   TRIG 50 03/15/2024   CHOLHDL 2.9 09/14/2021    Lab Results  Component Value Date   HGBA1C 5.4 09/14/2021   Lab Results  Component Value Date   TSH 1.330 03/15/2024       Assessment & Plan    1.  Chronic heart failure with midrange ejection  fraction/nonischemic cardiomyopathy: EF previously as low as 40% with most recent echo in May 2025 showing an EF of 45 to 50% with normal RV size and function and no significant valvular disease.  Prior catheterization 2017 showed normal coronary arteries.  He has done reasonably well and is euvolemic on examination today.  He has been taking both Entresto  and losartan  despite having previously been told to discontinue losartan .  He is now out of Entresto  and prefers to just continue on losartan  50 mg daily, as this will be more affordable for him.  As such, we have updated his medicine list to reflect the above.  He remains on beta-blocker therapy.  He believes SGLT2 inhibitor therapy would be cost prohibitive.  He had been having some orthostatic lightheadedness while taking both losartan  and Entresto  and I suspect this may improve on just losartan .  We can look to potentially add MRA therapy at follow-up if blood pressures are stable.  Of note, he has been on diltiazem  for many years and after discussion with electrophysiology in 2025, decision was made to continue it despite mild LV dysfunction as he was deriving benefit related to reduced palpitations.  His heart rate is 48 today.  He is currently asymptomatic.  I am going to reduce his diltiazem  to 120 mg daily and perhaps we can look to discontinue it altogether at follow-up with further titration of beta-blocker if appropriate.  2.  Palpitations/PVCs: Previously evaluated with an EP study in April 2019, which was negative for inducible tachycardias.  He notes occasional palpitations.  He does have an implantable loop recorder which has not shown any significant arrhythmias.  He is managed on beta-blocker and calcium  channel blocker.  I did review his most recent implantable loop recorder transmission with our electrophysiology team today.  Resting heart rates at home appear to typically trend in the 50s.  Heart rate is 48 today.  As above, reducing  diltiazem  CD dose.    3.  Primary hypertension: Blood pressure stable today at 130/70  on losartan  50 mg.  As above, he had been taking both losartan  and Entresto  and noted some orthostatic lightheadedness.  This has resolved since he is been off of Entresto  and he prefers to remain off of it.  Continue losartan , beta-blocker.  Reducing calcium  channel blocker in the setting of bradycardia.  4.  Tobacco abuse: Still smoking 3 cigarettes a day.  Complete cessation advised.  5.  Atypical chest pain: Long history of sharp and somewhat fleeting chest pain dating back many years with normal coronary arteries on catheterization in 2017.  Pain is worse when he lays on his left side.  ECG unchanged.  No further ischemic evaluation at this time.  6.  Disposition: Follow-up in 3 months or sooner if necessary.  Lonni Meager, NP 07/29/2024, 5:26 PM     [1] No Known Allergies  "

## 2024-07-29 NOTE — Patient Instructions (Signed)
 Medication Instructions:  STOP the Entresto   DECREASE the Diltazem to 120 mg once daily  *If you need a refill on your cardiac medications before your next appointment, please call your pharmacy*  Lab Work: None ordered If you have labs (blood work) drawn today and your tests are completely normal, you will receive your results only by: MyChart Message (if you have MyChart) OR A paper copy in the mail If you have any lab test that is abnormal or we need to change your treatment, we will call you to review the results.  Testing/Procedures: None ordered  Follow-Up: At [facility], you and your health needs are our priority.  As part of our continuing mission to provide you with exceptional heart care, our providers are all part of one team.  This team includes your primary Cardiologist (physician) and Advanced Practice Providers or APPs (Physician Assistants and Nurse Practitioners) who all work together to provide you with the care you need, when you need it.  Your next appointment:   3 month(s)  Provider:   Redell Cave, MD    We recommend signing up for the patient portal called MyChart.  Sign up information is provided on this After Visit Summary.  MyChart is used to connect with patients for Virtual Visits (Telemedicine).  Patients are able to view lab/test results, encounter notes, upcoming appointments, etc.  Non-urgent messages can be sent to your provider as well.   To learn more about what you can do with MyChart, go to [email or web address].

## 2024-07-30 ENCOUNTER — Other Ambulatory Visit: Payer: Self-pay

## 2024-07-30 ENCOUNTER — Telehealth: Payer: Self-pay

## 2024-07-30 NOTE — Patient Instructions (Signed)
 Visit Information  Thank you for taking time to visit with me today. Please don't hesitate to contact me if I can be of assistance to you before our next scheduled appointment.  Our next appointment is by telephone on 05/10/25 at 1100am Please call the care guide team at [phone] if you need to cancel or reschedule your appointment.   Following is a copy of your care plan:   Goals Addressed   None     Please call the Suicide and Crisis Lifeline: 988 call the [place]  National Suicide Prevention Lifeline: 575 594 7865 or TTY: 323-454-8885 TTY 858-759-0736) to talk to a trained counselor call 1-800-273-TALK (toll free, 24 hour hotline) if you are experiencing a Mental Health or Behavioral Health Crisis or need someone to talk to.  Patient verbalized understanding of Care plan and visit instructions communicated this visit  Melvin Marmo RN RN Care Manager [facility] [phone]

## 2024-07-30 NOTE — Patient Outreach (Signed)
 Complex Care Management   Visit Note  07/30/2024  Name:  Jesus Price MRN: [id number] DOB: 07/04/1960  Situation: Referral received for Complex Care Management related to Heart Failure I obtained verbal consent from Patient.  Visit completed with Patient  on the phone  Background:   Past Medical History:  Diagnosis Date   Arthritis    Atypical chest pain    Family history of sudden cardiac death    Glaucoma    Heart failure with mid-range ejection fraction (HCC)    a. 10/2023 Echo: EF 45-50%, no rwma, nl RV size/fxn.   Hypertension    Implantable loop recorder present    a. MDT Linq - placed 09/2017.   NICM (nonischemic cardiomyopathy) (HCC)    a. 2012 Echo: EF 40%; b. 05/2016 Cath: nl cors; c. 05/2016 Echo: EF 50%; d. 09/2017 cMRI [facility]): EF 39%, nl RV size/fxn, no signif valvular dzs. No scar/infiltrative process; e. 12/2020 Echo:  EF 45-50%; f. 10/2023 Echo: EF 45-50%, no rwma, nl RV size/fxn.   Shortened PR interval w/ palpitations    a. 09/2017 EP Study [facility]): no evidence of dual AV nodal physiology/VA conduction/accessory pathway/inducible SVT.  MDT Linq placed.   Situational depression 06/07/2016   Tobacco abuse     Assessment: Patient Reported Symptoms:  Cognitive Cognitive Status: No symptoms reported Cognitive/Intellectual Conditions Management [RPT]: None reported or documented in medical history or problem list   Health Maintenance Behaviors: Annual physical exam Healing Pattern: Average  Neurological Neurological Review of Symptoms: No symptoms reported Neurological Management Strategies: Adequate rest Neurological Self-Management Outcome: 4 (good)  HEENT HEENT Symptoms Reported: No symptoms reported HEENT Management Strategies: Routine screening HEENT Self-Management Outcome: 4 (good)    Cardiovascular Cardiovascular Symptoms Reported: No symptoms reported Does patient have uncontrolled Hypertension?: No Cardiovascular Management Strategies: Medication  therapy, Routine screening Weight: 116 lb (52.6 kg) Cardiovascular Self-Management Outcome: 4 (good)  Respiratory Respiratory Symptoms Reported: Shortness of breath Other Respiratory Symptoms: most eeryday Additional Respiratory Details: had appt. Respiratory Management Strategies: Routine screening Respiratory Self-Management Outcome: 3 (uncertain)  Endocrine Endocrine Symptoms Reported: Blurry vision Is patient diabetic?: No Endocrine Self-Management Outcome: 4 (good)  Gastrointestinal Gastrointestinal Symptoms Reported: No symptoms reported Gastrointestinal Self-Management Outcome: 4 (good)    Genitourinary Genitourinary Symptoms Reported: No symptoms reported Genitourinary Management Strategies: Adequate rest Genitourinary Self-Management Outcome: 4 (good)  Integumentary Integumentary Symptoms Reported: No symptoms reported Skin Management Strategies: Routine screening Skin Self-Management Outcome: 4 (good)  Musculoskeletal Musculoskelatal Symptoms Reviewed: Back pain Other Musculoskeletal Symptoms: minor today Musculoskeletal Management Strategies: Medication therapy, Routine screening Musculoskeletal Self-Management Outcome: 4 (good) Falls in the past year?: No Number of falls in past year: 1 or less Was there an injury with Fall?: No Fall Risk Category Calculator: 0 Patient Fall Risk Level: Low Fall Risk Patient at Risk for Falls Due to: No Fall Risks Fall risk Follow up: Education provided, Falls prevention discussed, Falls evaluation completed  Psychosocial Additional Psychological Details: f/u 2/5 Behavioral Management Strategies: Coping strategies Behavioral Health Self-Management Outcome: 4 (good) Major Change/Loss/Stressor/Fears (CP): Medical condition, family Techniques to [place] with Loss/Stress/Change: Diversional activities Quality of Family Relationships: supportive Do you feel physically threatened by others?: No    07/30/2024    PHQ2-9 Depression Screening    Little interest or pleasure in doing things Several days  Feeling down, depressed, or hopeless Several days  PHQ-2 - Total Score 2  Trouble falling or staying asleep, or sleeping too much Several days  Feeling tired or having little energy Several  days  Poor appetite or overeating  Several days  Feeling bad about yourself - or that you are a failure or have let yourself or your family down Not at all  Trouble concentrating on things, such as reading the newspaper or watching television Several days  Moving or speaking so slowly that other people could have noticed.  Or the opposite - being so fidgety or restless that you have been moving around a lot more than usual Not at all  Thoughts that you would be better off dead, or hurting yourself in some way Not at all  PHQ2-9 Total Score 6  If you checked off any problems, how difficult have these problems made it for you to do your work, take care of things at home, or get along with other people    Depression Interventions/Treatment Referral to Psychiatry    Today's Vitals   07/30/24 1322  Weight: 116 lb (52.6 kg)   Pain Scale: 0-10 Pain Score: 0-No pain  Medications Reviewed Today     Reviewed by Nivia, Kassi Esteve , RN (Registered Nurse) on 07/30/24 at 1320  Med List Status: <None>   Medication Order Taking? Sig Documenting Provider Last Dose Status Informant  aspirin  EC 81 MG tablet [id number] Yes Take 1 tablet (81 mg total) by mouth daily. Swallow whole. Cannady, Jolene T, NP  Active   carvedilol  (COREG ) 6.25 MG tablet [id number] Yes Take 1 tablet (6.25 mg total) by mouth 2 (two) times daily. Darliss Rogue, MD  Active   COSOPT 2-0.5 % ophthalmic solution [id number] Yes 1 drop 2 (two) times daily. [provider]  Active   diltiazem  (CARDIZEM  CD) 120 MG 24 hr capsule [id number]  Take 1 capsule (120 mg total) by mouth daily. Vivienne Lonni Ingle, NP  Active   fluticasone [facility]) 50 MCG/ACT nasal spray [id number] Yes Place  1 spray into both nostrils daily. [provider]  Active   hydrOXYzine  (VISTARIL ) 25 MG capsule [id number] Yes Take 1 capsule (25 mg total) by mouth every 8 (eight) hours as needed for anxiety. Cannady, Jolene T, NP  Active   ibuprofen  (ADVIL ) 600 MG tablet [id number] Yes Take 1 tablet (600 mg total) by mouth every 6 (six) hours as needed. Corlis Burnard DEL, NP  Active   latanoprost (XALATAN) 0.005 % ophthalmic solution [id number] Yes Place 1 drop into both eyes at bedtime. [provider]  Active   losartan  (COZAAR ) 50 MG tablet [id number]  Take 1 tablet (50 mg total) by mouth daily. Vivienne Lonni Ingle, NP  Active   meloxicam  (MOBIC ) 15 MG tablet [id number] Yes Take 1 tablet (15 mg total) by mouth daily. Cannady, Jolene T, NP  Active   methocarbamol  (ROBAXIN ) 500 MG tablet [id number] Yes Take 1 tablet (500 mg total) by mouth every 6 (six) hours as needed for muscle spasms. Cannady, Jolene T, NP  Active   mirtazapine  (REMERON ) 7.5 MG tablet [id number] Yes Take 1 tablet (7.5 mg total) by mouth at bedtime. Cannady, Jolene T, NP  Active   rosuvastatin  (CRESTOR ) 10 MG tablet [id number] Yes Take 1 tablet (10 mg total) by mouth daily. Darliss Rogue, MD  Active   sildenafil  (VIAGRA ) 100 MG tablet [id number] Yes TAKE ONE TABLET BY MOUTH ONE TIME DAILY 1 HOUR PRIOR TO INTERCOURSE Stoioff, Glendia BROCKS, MD  Active   tamsulosin  (FLOMAX ) 0.4 MG CAPS capsule [id number] Yes Take 2 capsules (0.8 mg total) by mouth daily after supper. Cannady, Jolene T, NP  Active  Recommendation:   Continue Current Plan of Care  Follow Up Plan:   Telephone follow-up in 1 month  Ameet Sandy RN RN Care Manager [employer] [phone]

## 2024-07-30 NOTE — Progress Notes (Unsigned)
 Care Guide Pharmacy Note  07/30/2024 Name: Jesus Price MRN: [id number] DOB: 23-Dec-1960  Referred By: Valerio Melanie DASEN, NP Reason for referral: Complex Care Management (Outreach to schedule with Pharm d )   Jesus Price is a 64 y.o. year old male who is a primary care patient of Cannady, Jolene T, NP.  Jesus Price was referred to the pharmacist for assistance related to: HTN and HLD  A second unsuccessful telephone outreach was attempted today to contact the patient who was referred to the pharmacy team for assistance with medication assistance. Additional attempts will be made to contact the patient.  Jesus Price , RMA     [facility]  [facility], [facility] Guide  Direct Dial: [phone]  Website: [email or web address]

## 2024-07-31 ENCOUNTER — Ambulatory Visit

## 2024-08-03 ENCOUNTER — Ambulatory Visit: Payer: Self-pay

## 2024-08-03 LAB — CUP PACEART REMOTE DEVICE CHECK
Date Time Interrogation Session: 20260202231245
Implantable Pulse Generator Implant Date: 20221228

## 2024-08-05 NOTE — Progress Notes (Signed)
 Care Guide Pharmacy Note  08/05/2024 Name: Jesus Price MRN: [id number] DOB: [birth year]  Referred By: Valerio Melanie DASEN, NP Reason for referral: Complex Care Management (Outreach to schedule with Pharm d )   Jesus Price is a 64 y.o. year old male who is a primary care patient of Cannady, Jolene T, NP.  Jesus Price was referred to the pharmacist for assistance related to: HTN and HLD  Successful contact was made with the patient to discuss pharmacy services.  Patient declines engagement at this time. Contact information was provided to the patient should they wish to reach out for assistance at a later time.  Jesus Price , RMA     [facility]  [facility], [facility] Guide  Direct Dial: [phone]  Website: [email or web address]

## 2024-08-13 ENCOUNTER — Telehealth

## 2024-08-18 ENCOUNTER — Ambulatory Visit: Payer: Medicare HMO | Admitting: Urology

## 2024-08-25 ENCOUNTER — Ambulatory Visit: Admitting: Urology

## 2024-09-03 ENCOUNTER — Ambulatory Visit

## 2024-10-04 ENCOUNTER — Ambulatory Visit

## 2024-10-11 ENCOUNTER — Encounter: Admitting: Nurse Practitioner

## 2024-10-12 ENCOUNTER — Ambulatory Visit

## 2024-10-29 ENCOUNTER — Ambulatory Visit: Admitting: Cardiology

## 2024-11-04 ENCOUNTER — Ambulatory Visit

## 2024-12-05 ENCOUNTER — Ambulatory Visit

## 2025-01-05 ENCOUNTER — Ambulatory Visit

## 2025-02-05 ENCOUNTER — Ambulatory Visit

## 2025-03-08 ENCOUNTER — Ambulatory Visit

## 2025-04-08 ENCOUNTER — Ambulatory Visit

## 2025-05-09 ENCOUNTER — Ambulatory Visit

## 2025-06-09 ENCOUNTER — Ambulatory Visit

## 2025-07-10 ENCOUNTER — Ambulatory Visit
